# Patient Record
Sex: Female | Born: 1945 | Race: Black or African American | Hispanic: No | Marital: Married | Smoking: Current every day smoker
Health system: Southern US, Community
[De-identification: ages and names within clinical notes are randomized; demographics above are authoritative.]

## PROBLEM LIST (undated history)

## (undated) DIAGNOSIS — C3490 Malignant neoplasm of unspecified part of unspecified bronchus or lung: Secondary | ICD-10-CM

## (undated) DIAGNOSIS — J449 Chronic obstructive pulmonary disease, unspecified: Secondary | ICD-10-CM

## (undated) DIAGNOSIS — I729 Aneurysm of unspecified site: Secondary | ICD-10-CM

## (undated) DIAGNOSIS — I10 Essential (primary) hypertension: Secondary | ICD-10-CM

## (undated) DIAGNOSIS — E039 Hypothyroidism, unspecified: Secondary | ICD-10-CM

## (undated) DIAGNOSIS — K259 Gastric ulcer, unspecified as acute or chronic, without hemorrhage or perforation: Secondary | ICD-10-CM

## (undated) DIAGNOSIS — Z8679 Personal history of other diseases of the circulatory system: Secondary | ICD-10-CM

## (undated) DIAGNOSIS — R7303 Prediabetes: Secondary | ICD-10-CM

## (undated) DIAGNOSIS — F419 Anxiety disorder, unspecified: Secondary | ICD-10-CM

## (undated) DIAGNOSIS — C801 Malignant (primary) neoplasm, unspecified: Secondary | ICD-10-CM

## (undated) DIAGNOSIS — Z9889 Other specified postprocedural states: Secondary | ICD-10-CM

## (undated) DIAGNOSIS — Z982 Presence of cerebrospinal fluid drainage device: Secondary | ICD-10-CM

## (undated) DIAGNOSIS — J9610 Chronic respiratory failure, unspecified whether with hypoxia or hypercapnia: Secondary | ICD-10-CM

## (undated) DIAGNOSIS — Z9981 Dependence on supplemental oxygen: Secondary | ICD-10-CM

## (undated) DIAGNOSIS — K219 Gastro-esophageal reflux disease without esophagitis: Secondary | ICD-10-CM

## (undated) DIAGNOSIS — D649 Anemia, unspecified: Secondary | ICD-10-CM

## (undated) HISTORY — DX: Gastric ulcer, unspecified as acute or chronic, without hemorrhage or perforation: K25.9

## (undated) HISTORY — PX: CATARACT EXTRACTION, BILATERAL: SHX1313

## (undated) HISTORY — PX: LUNG REMOVAL, PARTIAL: SHX233

## (undated) HISTORY — PX: ABDOMINAL HYSTERECTOMY: SHX81

---

## 2001-04-22 ENCOUNTER — Encounter: Payer: Self-pay | Admitting: Family Medicine

## 2001-04-22 ENCOUNTER — Ambulatory Visit (HOSPITAL_COMMUNITY): Admission: RE | Admit: 2001-04-22 | Discharge: 2001-04-22 | Payer: Self-pay | Admitting: Family Medicine

## 2002-04-23 ENCOUNTER — Encounter: Payer: Self-pay | Admitting: *Deleted

## 2002-04-23 ENCOUNTER — Ambulatory Visit (HOSPITAL_COMMUNITY): Admission: RE | Admit: 2002-04-23 | Discharge: 2002-04-23 | Payer: Self-pay | Admitting: *Deleted

## 2003-05-11 ENCOUNTER — Ambulatory Visit (HOSPITAL_COMMUNITY): Admission: RE | Admit: 2003-05-11 | Discharge: 2003-05-11 | Payer: Self-pay | Admitting: Internal Medicine

## 2003-06-14 ENCOUNTER — Ambulatory Visit (HOSPITAL_COMMUNITY): Admission: RE | Admit: 2003-06-14 | Discharge: 2003-06-14 | Payer: Self-pay | Admitting: *Deleted

## 2003-06-14 ENCOUNTER — Encounter: Payer: Self-pay | Admitting: *Deleted

## 2004-02-21 ENCOUNTER — Encounter (HOSPITAL_COMMUNITY): Admission: RE | Admit: 2004-02-21 | Discharge: 2004-03-22 | Payer: Self-pay

## 2004-03-23 ENCOUNTER — Encounter (HOSPITAL_COMMUNITY): Admission: RE | Admit: 2004-03-23 | Discharge: 2004-04-22 | Payer: Self-pay | Admitting: *Deleted

## 2004-04-23 ENCOUNTER — Encounter (HOSPITAL_COMMUNITY): Admission: RE | Admit: 2004-04-23 | Discharge: 2004-05-23 | Payer: Self-pay | Admitting: *Deleted

## 2004-05-25 ENCOUNTER — Encounter (HOSPITAL_COMMUNITY): Admission: RE | Admit: 2004-05-25 | Discharge: 2004-06-24 | Payer: Self-pay | Admitting: *Deleted

## 2004-10-31 ENCOUNTER — Ambulatory Visit (HOSPITAL_COMMUNITY): Admission: RE | Admit: 2004-10-31 | Discharge: 2004-10-31 | Payer: Self-pay | Admitting: Family Medicine

## 2005-11-19 ENCOUNTER — Ambulatory Visit (HOSPITAL_COMMUNITY): Admission: RE | Admit: 2005-11-19 | Discharge: 2005-11-19 | Payer: Self-pay | Admitting: Family Medicine

## 2008-09-21 ENCOUNTER — Ambulatory Visit (HOSPITAL_COMMUNITY): Admission: RE | Admit: 2008-09-21 | Discharge: 2008-09-21 | Payer: Self-pay | Admitting: Internal Medicine

## 2009-10-11 ENCOUNTER — Ambulatory Visit (HOSPITAL_COMMUNITY): Admission: RE | Admit: 2009-10-11 | Discharge: 2009-10-11 | Payer: Self-pay | Admitting: Internal Medicine

## 2010-10-15 ENCOUNTER — Ambulatory Visit (HOSPITAL_COMMUNITY): Admission: RE | Admit: 2010-10-15 | Discharge: 2010-10-15 | Payer: Self-pay | Admitting: Cardiology

## 2010-12-22 ENCOUNTER — Encounter: Payer: Self-pay | Admitting: Family Medicine

## 2011-04-19 NOTE — Op Note (Signed)
   NAME:  DALAYZA, ZAMBRANA                          ACCOUNT NO.:  [id number]   MEDICAL RECORD NO.:  [id number]                   PATIENT TYPE:  AMB   LOCATION:  DAY                                  FACILITY:  [facility]   PHYSICIAN:  Lionel December, M.D.                 DATE OF BIRTH:  [birth year]   DATE OF PROCEDURE:  05/11/2003  DATE OF DISCHARGE:                                 OPERATIVE REPORT   PROCEDURE:  Total colonoscopy.   INDICATIONS FOR PROCEDURE:  Ms. Tracey Martinez is a 65 year old African-American  female who is undergoing screening colonoscopy. She has constipation  possibly related to her medications but no other GI symptoms. Family history  is significant for polyps in a brother and a mother. The procedure was  reviewed with the patient and informed consent was obtained.   PREOP MEDICATIONS:  Demerol 75 mg IV, Versed 8 mg IV in divided dose.   INSTRUMENT:  Olympus video system.   FINDINGS:  Procedure performed in endoscopy suite. The patient's vital signs  and O2 saturations were monitored during the procedure and remained stable.  The patient was placed in the left lateral decubitus position, rectal  examination performed. No abnormalities noted on external or digital exam.  The scope was placed in the rectum and advanced under direct vision to the  sigmoid colon and beyond. Redundant colon, she still had scattered stool.  Preparation overall was felt to be satisfactory.  The scope was passed into  the cecum which was identified by the appendiceal orifice and appendiceal  stump/orifice. Pictures were taken for the record. As the scope was  withdrawn, colonic mucosa was once again carefully examined. A few tiny  diverticula noted in the sigmoid colon. There were no polyps or tumor  masses. The rectal mucosa was normal. The scope was retroflexed to examine  the anorectal junction and small hemorrhoids were noted below the dentate  line. The endoscope was straightened and withdrawn.  The patient tolerated  the procedure well.   FINAL DIAGNOSES:  A few tiny diverticula at the sigmoid colon, small  external hemorrhoids otherwise normal redundant colon.   RECOMMENDATIONS:  High fiber diet. She will resume her usual medications.  She should continue with yearly Hemoccults and consider having another exam  10 years from now.                                               Lionel December, M.D.    NR/MEDQ  D:  05/11/2003  T:  05/11/2003  Job:  [id number]   cc:   Stanton Kidney B. Jenelle Mages, M.D.  [facility]

## 2011-10-07 ENCOUNTER — Other Ambulatory Visit (HOSPITAL_COMMUNITY): Payer: Self-pay | Admitting: Nurse Practitioner

## 2011-10-07 DIAGNOSIS — Z139 Encounter for screening, unspecified: Secondary | ICD-10-CM

## 2011-10-21 ENCOUNTER — Ambulatory Visit (HOSPITAL_COMMUNITY)
Admission: RE | Admit: 2011-10-21 | Discharge: 2011-10-21 | Disposition: A | Discharge status: Home / Self Care | Payer: Medicare Other | Source: Ambulatory Visit | Attending: Nurse Practitioner | Admitting: Nurse Practitioner

## 2011-10-21 DIAGNOSIS — Z1231 Encounter for screening mammogram for malignant neoplasm of breast: Secondary | ICD-10-CM | POA: Insufficient documentation

## 2011-10-21 DIAGNOSIS — Z139 Encounter for screening, unspecified: Secondary | ICD-10-CM

## 2012-04-22 ENCOUNTER — Emergency Department (HOSPITAL_COMMUNITY): Payer: Medicare Other

## 2012-04-22 ENCOUNTER — Emergency Department (HOSPITAL_COMMUNITY)
Admission: EM | Admit: 2012-04-22 | Discharge: 2012-04-22 | Disposition: A | Discharge status: Home / Self Care | Payer: Medicare Other | Attending: Emergency Medicine | Admitting: Emergency Medicine

## 2012-04-22 ENCOUNTER — Encounter (HOSPITAL_COMMUNITY): Payer: Self-pay | Admitting: *Deleted

## 2012-04-22 DIAGNOSIS — C3490 Malignant neoplasm of unspecified part of unspecified bronchus or lung: Secondary | ICD-10-CM | POA: Insufficient documentation

## 2012-04-22 DIAGNOSIS — I10 Essential (primary) hypertension: Secondary | ICD-10-CM | POA: Insufficient documentation

## 2012-04-22 DIAGNOSIS — R062 Wheezing: Secondary | ICD-10-CM | POA: Insufficient documentation

## 2012-04-22 DIAGNOSIS — R0602 Shortness of breath: Secondary | ICD-10-CM | POA: Insufficient documentation

## 2012-04-22 DIAGNOSIS — J441 Chronic obstructive pulmonary disease with (acute) exacerbation: Secondary | ICD-10-CM

## 2012-04-22 HISTORY — DX: Malignant (primary) neoplasm, unspecified: C80.1

## 2012-04-22 HISTORY — DX: Malignant neoplasm of unspecified part of unspecified bronchus or lung: C34.90

## 2012-04-22 HISTORY — DX: Gastro-esophageal reflux disease without esophagitis: K21.9

## 2012-04-22 HISTORY — DX: Essential (primary) hypertension: I10

## 2012-04-22 HISTORY — DX: Aneurysm of unspecified site: I72.9

## 2012-04-22 HISTORY — DX: Anxiety disorder, unspecified: F41.9

## 2012-04-22 LAB — BASIC METABOLIC PANEL
CO2: 35 mEq/L — ABNORMAL HIGH (ref 19–32)
Calcium: 10 mg/dL (ref 8.4–10.5)
Creatinine, Ser: 0.67 mg/dL (ref 0.50–1.10)
Glucose, Bld: 155 mg/dL — ABNORMAL HIGH (ref 70–99)
Potassium: 3.5 mEq/L (ref 3.5–5.1)
Sodium: 133 mEq/L — ABNORMAL LOW (ref 135–145)

## 2012-04-22 LAB — DIFFERENTIAL
Basophils Absolute: 0 10*3/uL (ref 0.0–0.1)
Eosinophils Absolute: 0.1 10*3/uL (ref 0.0–0.7)
Lymphocytes Relative: 10 % — ABNORMAL LOW (ref 12–46)
Lymphs Abs: 0.9 10*3/uL (ref 0.7–4.0)
Monocytes Absolute: 0.9 10*3/uL (ref 0.1–1.0)
Monocytes Relative: 9 % (ref 3–12)
Neutrophils Relative %: 80 % — ABNORMAL HIGH (ref 43–77)

## 2012-04-22 LAB — CBC
MCHC: 32.2 g/dL (ref 30.0–36.0)
Platelets: 404 10*3/uL — ABNORMAL HIGH (ref 150–400)
RBC: 3.18 MIL/uL — ABNORMAL LOW (ref 3.87–5.11)
RDW: 15.5 % (ref 11.5–15.5)

## 2012-04-22 LAB — BLOOD GAS, ARTERIAL
Drawn by: 23534
O2 Content: 2 L/min
pCO2 arterial: 48.4 mmHg — ABNORMAL HIGH (ref 35.0–45.0)
pH, Arterial: 7.444 — ABNORMAL HIGH (ref 7.350–7.400)

## 2012-04-22 LAB — CARDIAC PANEL(CRET KIN+CKTOT+MB+TROPI)
Relative Index: INVALID (ref 0.0–2.5)
Total CK: 19 U/L (ref 7–177)

## 2012-04-22 MED ORDER — IPRATROPIUM BROMIDE 0.02 % IN SOLN
0.5000 mg | Freq: Once | RESPIRATORY_TRACT | Status: AC
  Administered 2012-04-22: 0.5 mg via RESPIRATORY_TRACT
  Filled 2012-04-22: qty 2.5

## 2012-04-22 MED ORDER — METHYLPREDNISOLONE SODIUM SUCC 125 MG IJ SOLR
125.0000 mg | Freq: Once | INTRAMUSCULAR | Status: AC
  Administered 2012-04-22: 125 mg via INTRAVENOUS
  Filled 2012-04-22: qty 2

## 2012-04-22 MED ORDER — IOHEXOL 350 MG/ML SOLN
100.0000 mL | Freq: Once | INTRAVENOUS | Status: AC | PRN
  Administered 2012-04-22: 100 mL via INTRAVENOUS

## 2012-04-22 MED ORDER — ALBUTEROL SULFATE (5 MG/ML) 0.5% IN NEBU
2.5000 mg | INHALATION_SOLUTION | Freq: Once | RESPIRATORY_TRACT | Status: AC
  Administered 2012-04-22: 2.5 mg via RESPIRATORY_TRACT
  Filled 2012-04-22: qty 0.5

## 2012-04-22 MED ORDER — PREDNISONE 50 MG PO TABS: ORAL_TABLET | ORAL | Status: AC

## 2012-04-22 MED ORDER — ALBUTEROL SULFATE HFA 108 (90 BASE) MCG/ACT IN AERS: 2.0000 | INHALATION_SPRAY | RESPIRATORY_TRACT | Status: DC | PRN

## 2012-04-22 MED ORDER — DOXYCYCLINE HYCLATE 100 MG PO CAPS: 100.0000 mg | ORAL_CAPSULE | Freq: Two times a day (BID) | ORAL | Status: AC

## 2012-04-22 MED ORDER — MOXIFLOXACIN HCL IN NACL 400 MG/250ML IV SOLN
400.0000 mg | Freq: Once | INTRAVENOUS | Status: AC
  Administered 2012-04-22: 400 mg via INTRAVENOUS
  Filled 2012-04-22: qty 250

## 2012-04-22 NOTE — ED Notes (Signed)
States productive cough of yellow phelgm and increased use of home O2 usually at 2 L/M but at times at 3 L/M, hx of lung CA

## 2012-04-22 NOTE — ED Notes (Signed)
Pt c/o sob that started two weeks ago, requires oxygen on prn basis, states that she has had to use it more over the past two weeks, admits to cough, productive with yellow sputum production, denies any pain,

## 2012-04-22 NOTE — Discharge Instructions (Signed)
Chronic Obstructive Pulmonary Disease Followup with her Dr. this week. Followup with your oncologist and arm regarding her lung nodule. Return to ED [place] worsening symptoms. Chronic obstructive pulmonary disease (COPD) is a condition in which airflow from the lungs is restricted. The lungs can never return to normal, but there are measures you can take which will improve them and make you feel better. CAUSES   Smoking.   Exposure to secondhand smoke.   Breathing in irritants (pollution, cigarette smoke, strong smells, aerosol sprays, paint fumes).   History of lung infections.  TREATMENT  Treatment focuses on making you comfortable (supportive care). Your caregiver may prescribe medications (inhaled or pills) to help improve your breathing. HOME CARE INSTRUCTIONS   If you smoke, stop smoking.   Avoid exposure to smoke, chemicals, and fumes that aggravate your breathing.   Take antibiotic medicines as directed by your caregiver.   Avoid medicines that dry up your system and slow down the elimination of secretions (antihistamines and cough syrups). This decreases respiratory capacity and may lead to infections.   Drink enough water and fluids to keep your urine clear or pale yellow. This loosens secretions.   Use humidifiers at home and at your bedside if they do not make breathing difficult.   Receive all protective vaccines your caregiver suggests, especially pneumococcal and influenza.   Use home oxygen as suggested.   Stay active. Exercise and physical activity will help maintain your ability to do things you want to do.   Eat a healthy diet.  SEEK MEDICAL CARE IF:   You develop pus-like mucus (sputum).   Breathing is more labored or exercise becomes difficult to do.   You are running out of the medicine you take for your breathing.  SEEK IMMEDIATE MEDICAL CARE IF:   You have a rapid heart rate.   You have agitation, confusion, tremors, or are in a stupor (family  members may need to observe this).   It becomes difficult to breathe.   You develop chest pain.   You have a fever.  MAKE SURE YOU:   Understand these instructions.   Will watch your condition.   Will get help right away if you are not doing well or get worse.  Document Released: 08/28/2005 Document Revised: 11/07/2011 Document Reviewed: 01/18/2011 [facility] Patient Information 2012 [place], [place].

## 2012-04-22 NOTE — ED Provider Notes (Signed)
History  This chart was scribed for Glynn Octave, MD by Bennett Scrape. This patient was seen in room [facility] and the patient's care was started at 3:16PM.  CSN: [id number]  Arrival date & time 04/22/12  1447   First MD Initiated Contact with Patient 04/22/12 1516      Chief Complaint  Patient presents with  . Shortness of Breath    The history is provided by the patient. No language interpreter was used.    Tracey Martinez is a 66 y.o. female who presents to the Emergency Department complaining of 2 weeks of gradual onset, gradually worsening, constant SOB with associated productive cough of yellow sputum. The symptoms are worse with exertion. Pt states that she wears O2 at home and reports increased use since the onset of the symptoms. Pt also states that she is not able to due her normal activities at home, because she feels SOB with walking around the house. She reports doing albuterol and brovana breathing treatments once a day with no improvement in symptoms. Pt was sent here from her PCP's office, because PCP stated that she could not sufficiently treat her symptoms. She denies chest pain, fevers, and emesis as associated symptoms. She has a h/o COPD and lung CA but denies being on radiation currently. Pt has one prior admission for PNA. She denies a h/o diabetes. She is a current some day smoker but denies alcohol use.   PCP is Ninfa Linden. Dr. Ermalinda Memos is CA doctor in [facility].  Past Medical History  Diagnosis Date  . Aneurysm     brain  . Cancer   . Lung cancer   . GERD (gastroesophageal reflux disease)   . Hypertension   . Anxiety     Past Surgical History  Procedure Date  . Lung removal, partial   . Abdominal hysterectomy     No family history on file.  History  Substance Use Topics  . Smoking status: Current Some Day Smoker  . Smokeless tobacco: Not on file  . Alcohol Use: No     Review of Systems  A complete 10 system review of systems was  obtained and all systems are negative except as noted in the HPI and PMH.   Allergies  Sulfa antibiotics  Home Medications  No current outpatient prescriptions on file.  Triage Vitals: BP 112/64  Pulse 122  Temp(Src) 98.2 F (36.8 C) (Oral)  Resp 23  Ht 5\' 7"  (1.702 m)  Wt 150 lb (68.04 kg)  BMI 23.49 kg/m2  SpO2 95%  Physical Exam  Nursing note and vitals reviewed. Constitutional: She is oriented to person, place, and time. She appears well-developed and well-nourished. No distress.  HENT:  Head: Normocephalic and atraumatic.       Bitemporal wasting  Eyes: Conjunctivae and EOM are normal. Pupils are equal, round, and reactive to light.  Neck: Normal range of motion. Neck supple. No tracheal deviation present.  Cardiovascular: Normal rate, regular rhythm, normal heart sounds and intact distal pulses.   Pulmonary/Chest: Effort normal. No respiratory distress. She has wheezes.       Moist productive cough, coarse breath sounds throughout, rhonchi in the right upper lobe with scattered wheezes  Abdominal: Soft. She exhibits no distension.  Musculoskeletal: Normal range of motion. She exhibits no edema.  Neurological: She is alert and oriented to person, place, and time.  Skin: Skin is warm and dry.  Psychiatric: She has a normal mood and affect. Her behavior is normal.  ED Course  Procedures (including critical care time)  DIAGNOSTIC STUDIES: Oxygen Saturation is 95% on O2, adequate by my interpretation.    COORDINATION OF CARE: 3:25PM-Discussed treatment plan which includes chest x-ray and breathing treatments with pt and pt agreed to plan. Counseled pt on smoking cessation and pt states that she is trying to quit smoking. 5:01PM-Pt rechecked and fells better. Upon re-exam pt still has rhonchi. 6:52PM-Pt rechecked and states that she is ready to go home. Informed pt of nodule on lung and she states that she already knew about it. Discussed discharge plan with pt and pt  agreed to plan.  Labs Reviewed  BLOOD GAS, ARTERIAL - Abnormal; Notable for the following:    pH, Arterial 7.444 (*)    pCO2 arterial 48.4 (*)    Bicarbonate 32.6 (*)    Acid-Base Excess 8.3 (*)    All other components within normal limits  CBC - Abnormal; Notable for the following:    RBC 3.18 (*)    Hemoglobin 8.9 (*)    HCT 27.6 (*)    Platelets 404 (*)    All other components within normal limits  DIFFERENTIAL - Abnormal; Notable for the following:    Neutrophils Relative 80 (*)    Lymphocytes Relative 10 (*)    All other components within normal limits  BASIC METABOLIC PANEL - Abnormal; Notable for the following:    Sodium 133 (*)    Chloride 90 (*)    CO2 35 (*)    Glucose, Bld 155 (*)    GFR calc non Af Amer 89 (*)    All other components within normal limits  CARDIAC PANEL(CRET KIN+CKTOT+MB+TROPI)   Ct Angio Chest W/cm &/or Wo Cm  04/22/2012  *RADIOLOGY REPORT*  Clinical Data: Shortness of breath for 2 weeks.  Lung cancer.  CT ANGIOGRAPHY CHEST  Technique:  Multidetector CT imaging of the chest using the standard protocol during bolus administration of intravenous contrast. Multiplanar reconstructed images including MIPs were obtained and reviewed to evaluate the vascular anatomy.  Contrast: OMNIPAQUE IOHEXOL 350 MG/ML SOLN  Comparison: 04/22/2012  Findings: Prominent emphysema noted favoring the lung apices, with postoperative findings in the right thorax, including multiple right upper medial rib osteotomies, resulting in volume loss.  Soft tissue density above the aortic arch in the left upper lobe measures approximately 1.8 x 1.1 cm, and could represent scarring, region of radiation therapy, or residual/recurrent malignancy.  A spiculated nodule in the right lower lobe on image 47 of series 7 measures 2.2 x 1.4 cm and is suspicious for malignancy.  Posterior soft tissue density along the right upper lung is probably due to scarring or radiation therapy.  Primarily  linear densities with slightly nodular components are noted in the posterior basal segments of both lower lobes. Scarring/atelectasis favored.  Mild right heart enlargement is present with preserved interventricular septal contour without reflux of contrast into the IVC.  No filling defect is identified in the pulmonary arterial tree to suggest pulmonary embolus.  Thoracic spondylosis noted.  Punctate scattered calcifications in the liver are probably from old granulomatous disease.  IMPRESSION: 1. No filling defect is identified in the pulmonary arterial tree to suggest pulmonary embolus. 2.  Prominent emphysema. 3.  Spiculated 2.2 cm right lower lobe nodule is particularly concerning for malignancy. 4.  Soft tissue density in the lung parenchyma of the aortic arch could represent scarring or malignancy. 5.  Soft tissue density along the right upper posterior pleural surface is most  likely related to scarring or prior therapy, but requires monitoring in order to exclude cancer.  Alternatively, PET CT could be utilized to assess these abnormal regions. 6.  Abnormal right heart enlargement.  7.  Atherosclerosis.  Original Report Authenticated By: Dellia Cloud, M.D.   Dg Chest Portable 1 View  04/22/2012  *RADIOLOGY REPORT*  Clinical Data: Shortness of breath, cough and congestion.  Lung cancer.  PORTABLE CHEST - 1 VIEW  Comparison: None.  Findings: There are postoperative changes and volume loss in the right hemithorax.  Possible pleural edge at the apex of the right hemithorax.  Vertical lucencies are seen along the right paratracheal region.  Lungs are otherwise clear.  No pleural fluid.  IMPRESSION:  1.  Postoperative changes in the right hemithorax.  Question chronic right apical pleural air.  Difficult to definitively exclude right-sided pneumomediastinum.  Comparison with prior exams would be helpful.  In the absence of prior exams, CT chest with contrast would likely be helpful. 2.  Possible nodular  density in the peripheral right midlung zone. Again, this can be further evaluated with chest CT.  Original Report Authenticated By: Reyes Ivan, M.D.     No diagnosis found.    MDM  History of lung cancer status post resection, COPD presenting with shortness of breath, cough and subjective fevers. Coarse breath sounds bilaterally.  CT findings reviewed with patient. There is no pulmonary embolism. She is aware of the right lower lobe nodule and is followed by her oncologist in [place]. She ambulated in the department without any desaturation. Her breath sounds remained coarse. She states her breathing is better and she wishes to go home. Denies any chest pain. Will treat as COPD exacerbation.  Nebs, steroids, antibiotics, follow up with PCP this week.   Date: 04/22/2012  Rate: 102  Rhythm: sinus tachycardia  QRS Axis: normal  Intervals: normal  ST/T Wave abnormalities: normal  Conduction Disutrbances:none  Narrative Interpretation:   Old EKG Reviewed: none available    I personally performed the services described in this documentation, which was scribed in my presence.  The recorded information has been reviewed and considered.       Glynn Octave, MD 04/22/12 [phone]

## 2012-04-22 NOTE — ED Notes (Signed)
Ambulated pt. Pulse ox was about 95%, HR low in the 40s. Pt felt fine a little light headed

## 2012-05-08 ENCOUNTER — Encounter (HOSPITAL_COMMUNITY): Payer: Self-pay | Admitting: *Deleted

## 2012-05-08 ENCOUNTER — Inpatient Hospital Stay (HOSPITAL_COMMUNITY)
Admission: EM | Admit: 2012-05-08 | Discharge: 2012-05-12 | DRG: 378 | Disposition: A | Discharge status: Home / Self Care | Payer: Medicare Other | Attending: Internal Medicine | Admitting: Internal Medicine

## 2012-05-08 DIAGNOSIS — Z85118 Personal history of other malignant neoplasm of bronchus and lung: Secondary | ICD-10-CM

## 2012-05-08 DIAGNOSIS — R634 Abnormal weight loss: Secondary | ICD-10-CM

## 2012-05-08 DIAGNOSIS — D509 Iron deficiency anemia, unspecified: Secondary | ICD-10-CM | POA: Diagnosis present

## 2012-05-08 DIAGNOSIS — Z9981 Dependence on supplemental oxygen: Secondary | ICD-10-CM

## 2012-05-08 DIAGNOSIS — D500 Iron deficiency anemia secondary to blood loss (chronic): Secondary | ICD-10-CM | POA: Diagnosis present

## 2012-05-08 DIAGNOSIS — R112 Nausea with vomiting, unspecified: Secondary | ICD-10-CM

## 2012-05-08 DIAGNOSIS — F17200 Nicotine dependence, unspecified, uncomplicated: Secondary | ICD-10-CM | POA: Diagnosis present

## 2012-05-08 DIAGNOSIS — J9610 Chronic respiratory failure, unspecified whether with hypoxia or hypercapnia: Secondary | ICD-10-CM | POA: Diagnosis present

## 2012-05-08 DIAGNOSIS — J4489 Other specified chronic obstructive pulmonary disease: Secondary | ICD-10-CM | POA: Diagnosis present

## 2012-05-08 DIAGNOSIS — T3995XA Adverse effect of unspecified nonopioid analgesic, antipyretic and antirheumatic, initial encounter: Secondary | ICD-10-CM | POA: Diagnosis present

## 2012-05-08 DIAGNOSIS — K209 Esophagitis, unspecified without bleeding: Secondary | ICD-10-CM | POA: Diagnosis present

## 2012-05-08 DIAGNOSIS — K264 Chronic or unspecified duodenal ulcer with hemorrhage: Principal | ICD-10-CM | POA: Diagnosis present

## 2012-05-08 DIAGNOSIS — R531 Weakness: Secondary | ICD-10-CM

## 2012-05-08 DIAGNOSIS — D62 Acute posthemorrhagic anemia: Secondary | ICD-10-CM | POA: Diagnosis present

## 2012-05-08 DIAGNOSIS — C3490 Malignant neoplasm of unspecified part of unspecified bronchus or lung: Secondary | ICD-10-CM | POA: Diagnosis present

## 2012-05-08 DIAGNOSIS — D649 Anemia, unspecified: Secondary | ICD-10-CM

## 2012-05-08 DIAGNOSIS — I10 Essential (primary) hypertension: Secondary | ICD-10-CM | POA: Diagnosis present

## 2012-05-08 DIAGNOSIS — J449 Chronic obstructive pulmonary disease, unspecified: Secondary | ICD-10-CM | POA: Diagnosis present

## 2012-05-08 HISTORY — DX: Chronic respiratory failure, unspecified whether with hypoxia or hypercapnia: J96.10

## 2012-05-08 HISTORY — DX: Dependence on supplemental oxygen: Z99.81

## 2012-05-08 HISTORY — DX: Hypothyroidism, unspecified: E03.9

## 2012-05-08 HISTORY — DX: Anemia, unspecified: D64.9

## 2012-05-08 HISTORY — DX: Gastric ulcer, unspecified as acute or chronic, without hemorrhage or perforation: K25.9

## 2012-05-08 HISTORY — DX: Chronic obstructive pulmonary disease, unspecified: J44.9

## 2012-05-08 LAB — TROPONIN I: Troponin I: <0.3 ng/mL (ref <0.30)

## 2012-05-08 LAB — PROTIME-INR
INR: 1.02 (ref 0.00–1.49)
Prothrombin Time: 13.6 seconds (ref 11.6–15.2)

## 2012-05-08 LAB — CBC
Hemoglobin: 8.2 g/dL — ABNORMAL LOW (ref 12.0–15.0)
MCH: 25.6 pg — ABNORMAL LOW (ref 26.0–34.0)
MCH: 26.9 pg (ref 26.0–34.0)
MCV: 82.8 fL (ref 78.0–100.0)
Platelets: 585 10*3/uL — ABNORMAL HIGH (ref 150–400)
Platelets: 463 10*3/uL — ABNORMAL HIGH (ref 150–400)
RBC: 3.05 MIL/uL — ABNORMAL LOW (ref 3.87–5.11)
RDW: 17.1 % — ABNORMAL HIGH (ref 11.5–15.5)
WBC: 10.8 10*3/uL — ABNORMAL HIGH (ref 4.0–10.5)

## 2012-05-08 LAB — DIFFERENTIAL
Basophils Absolute: 0 10*3/uL (ref 0.0–0.1)
Eosinophils Absolute: 0.1 10*3/uL (ref 0.0–0.7)
Eosinophils Relative: 1 % (ref 0–5)
Lymphs Abs: 0.7 10*3/uL (ref 0.7–4.0)
Monocytes Absolute: 0.5 10*3/uL (ref 0.1–1.0)

## 2012-05-08 LAB — RETICULOCYTES: Retic Count, Absolute: 113 10*3/uL (ref 19.0–186.0)

## 2012-05-08 LAB — COMPREHENSIVE METABOLIC PANEL
ALT: 8 U/L (ref 0–35)
Calcium: 9.4 mg/dL (ref 8.4–10.5)
Creatinine, Ser: 0.85 mg/dL (ref 0.50–1.10)
GFR calc Af Amer: 80 mL/min — ABNORMAL LOW (ref >90)
Glucose, Bld: 154 mg/dL — ABNORMAL HIGH (ref 70–99)
Sodium: 128 mEq/L — ABNORMAL LOW (ref 135–145)
Total Protein: 6.6 g/dL (ref 6.0–8.3)

## 2012-05-08 LAB — GLUCOSE, CAPILLARY
Glucose-Capillary: 114 mg/dL — ABNORMAL HIGH (ref 70–99)
Glucose-Capillary: 131 mg/dL — ABNORMAL HIGH (ref 70–99)

## 2012-05-08 LAB — OCCULT BLOOD, POC DEVICE: Fecal Occult Bld: POSITIVE

## 2012-05-08 MED ORDER — THEOPHYLLINE ER 300 MG PO CP24
300.0000 mg | ORAL_CAPSULE | Freq: Two times a day (BID) | ORAL | Status: DC
  Administered 2012-05-08 – 2012-05-12 (×8): 300 mg via ORAL
  Filled 2012-05-08 (×11): qty 1

## 2012-05-08 MED ORDER — LEVOTHYROXINE SODIUM 88 MCG PO TABS
88.0000 ug | ORAL_TABLET | Freq: Every day | ORAL | Status: DC
  Administered 2012-05-08 – 2012-05-12 (×4): 88 ug via ORAL
  Filled 2012-05-08 (×6): qty 1

## 2012-05-08 MED ORDER — BUSPIRONE HCL 5 MG PO TABS
10.0000 mg | ORAL_TABLET | Freq: Three times a day (TID) | ORAL | Status: DC | PRN
  Administered 2012-05-10 – 2012-05-12 (×3): 10 mg via ORAL
  Filled 2012-05-08: qty 1
  Filled 2012-05-08 (×3): qty 2

## 2012-05-08 MED ORDER — TIOTROPIUM BROMIDE MONOHYDRATE 18 MCG IN CAPS
18.0000 ug | ORAL_CAPSULE | Freq: Every day | RESPIRATORY_TRACT | Status: DC
  Administered 2012-05-08 – 2012-05-12 (×5): 18 ug via RESPIRATORY_TRACT
  Filled 2012-05-08 (×2): qty 5

## 2012-05-08 MED ORDER — ALBUTEROL SULFATE HFA 108 (90 BASE) MCG/ACT IN AERS: 2.0000 | INHALATION_SPRAY | RESPIRATORY_TRACT | Status: DC | PRN

## 2012-05-08 MED ORDER — LISINOPRIL 10 MG PO TABS
10.0000 mg | ORAL_TABLET | Freq: Every day | ORAL | Status: DC
  Administered 2012-05-08 – 2012-05-12 (×4): 10 mg via ORAL
  Filled 2012-05-08 (×5): qty 1

## 2012-05-08 MED ORDER — SODIUM CHLORIDE 0.9 % IJ SOLN
INTRAMUSCULAR | Status: AC
  Administered 2012-05-08: 10 mL
  Filled 2012-05-08: qty 3

## 2012-05-08 MED ORDER — ACETAMINOPHEN 500 MG PO TABS
1000.0000 mg | ORAL_TABLET | ORAL | Status: DC | PRN
  Administered 2012-05-11 – 2012-05-12 (×2): 1000 mg via ORAL
  Filled 2012-05-08 (×2): qty 2

## 2012-05-08 MED ORDER — AMITRIPTYLINE HCL 25 MG PO TABS
50.0000 mg | ORAL_TABLET | Freq: Every day | ORAL | Status: DC
  Administered 2012-05-08 – 2012-05-11 (×4): 50 mg via ORAL
  Filled 2012-05-08: qty 1
  Filled 2012-05-08 (×2): qty 2
  Filled 2012-05-08: qty 1
  Filled 2012-05-08: qty 2

## 2012-05-08 MED ORDER — ATORVASTATIN CALCIUM 20 MG PO TABS
20.0000 mg | ORAL_TABLET | Freq: Every day | ORAL | Status: DC
  Administered 2012-05-08 – 2012-05-12 (×4): 20 mg via ORAL
  Filled 2012-05-08 (×5): qty 1

## 2012-05-08 MED ORDER — ARFORMOTEROL TARTRATE 15 MCG/2ML IN NEBU
15.0000 ug | INHALATION_SOLUTION | Freq: Two times a day (BID) | RESPIRATORY_TRACT | Status: DC
  Administered 2012-05-08 – 2012-05-12 (×9): 15 ug via RESPIRATORY_TRACT
  Filled 2012-05-08 (×10): qty 2

## 2012-05-08 MED ORDER — FERROUS SULFATE 325 (65 FE) MG PO TABS
325.0000 mg | ORAL_TABLET | Freq: Every day | ORAL | Status: DC
  Administered 2012-05-09 – 2012-05-12 (×4): 325 mg via ORAL
  Filled 2012-05-08 (×5): qty 1

## 2012-05-08 MED ORDER — FUROSEMIDE 20 MG PO TABS
20.0000 mg | ORAL_TABLET | Freq: Two times a day (BID) | ORAL | Status: DC
  Administered 2012-05-08 – 2012-05-12 (×8): 20 mg via ORAL
  Filled 2012-05-08 (×10): qty 1

## 2012-05-08 MED ORDER — DOCUSATE SODIUM 100 MG PO CAPS
100.0000 mg | ORAL_CAPSULE | Freq: Every day | ORAL | Status: DC
  Administered 2012-05-08 – 2012-05-12 (×4): 100 mg via ORAL
  Filled 2012-05-08 (×5): qty 1

## 2012-05-08 MED ORDER — PANTOPRAZOLE SODIUM 40 MG PO TBEC
40.0000 mg | DELAYED_RELEASE_TABLET | Freq: Every day | ORAL | Status: DC
  Administered 2012-05-08 – 2012-05-10 (×3): 40 mg via ORAL
  Filled 2012-05-08 (×3): qty 1

## 2012-05-08 MED ORDER — ONDANSETRON HCL 4 MG PO TABS: 4.0000 mg | ORAL_TABLET | Freq: Four times a day (QID) | ORAL | Status: DC | PRN

## 2012-05-08 MED ORDER — FUROSEMIDE 20 MG PO TABS: 20.0000 mg | ORAL_TABLET | Freq: Two times a day (BID) | ORAL | Status: DC

## 2012-05-08 MED ORDER — SENNOSIDES-DOCUSATE SODIUM 8.6-50 MG PO TABS
2.0000 | ORAL_TABLET | Freq: Every day | ORAL | Status: DC | PRN
  Administered 2012-05-09: 2 via ORAL
  Filled 2012-05-08: qty 2

## 2012-05-08 MED ORDER — ALBUTEROL SULFATE (5 MG/ML) 0.5% IN NEBU: 2.5000 mg | INHALATION_SOLUTION | RESPIRATORY_TRACT | Status: DC | PRN

## 2012-05-08 MED ORDER — FLUTICASONE PROPIONATE 50 MCG/ACT NA SUSP
2.0000 | Freq: Every day | NASAL | Status: DC
  Administered 2012-05-08 – 2012-05-12 (×4): 2 via NASAL
  Filled 2012-05-08: qty 16

## 2012-05-08 MED ORDER — ONDANSETRON HCL 4 MG/2ML IJ SOLN: 4.0000 mg | Freq: Four times a day (QID) | INTRAMUSCULAR | Status: DC | PRN

## 2012-05-08 MED ORDER — SODIUM CHLORIDE 0.9 % IV SOLN
INTRAVENOUS | Status: DC
  Administered 2012-05-08 – 2012-05-10 (×4): via INTRAVENOUS

## 2012-05-08 MED ORDER — SODIUM CHLORIDE 0.9 % IV SOLN: INTRAVENOUS | Status: DC

## 2012-05-08 NOTE — ED Notes (Signed)
Pt states that she was sent to er by pcp in [place] due to low "blood count" pt had blood work done yesterday, was called this am and told to come to er, pt recently seen in er on 04/30/2012 for bronchitis. C/o generalized  weakness that started around 04/23/2012 along with dizziness with standing. Pt states that she has noticed her stools being dark.

## 2012-05-08 NOTE — ED Notes (Signed)
CRITICAL VALUE ALERT  Critical value received:  Hgb:  5.8  Date of notification:  05/08/2012  Time of notification:  1022  Critical value read back:  yes  Nurse who received alert:  A. Dareen Piano, RN  MD notified (1st page):  I. Lynelle Doctor  Time of first page:  1023  MD notified (2nd page):  Time of second page:  Responding MD:  Lars Mage  Time MD responded:  [phone]

## 2012-05-08 NOTE — ED Provider Notes (Cosign Needed)
This chart was scribed for Ward Givens, MD by Wallis Mart. The patient was seen in room APA14/APA14 and the patient's care was started at 9:27 AM.   CSN: [id number]  Arrival date & time 05/08/12  0901   First MD Initiated Contact with Patient 05/08/12 0912      Chief Complaint  Patient presents with  . Abnormal Lab    (Consider location/radiation/quality/duration/timing/severity/associated sxs/prior treatment) HPI Tracey Martinez is a 66 y.o. female who presents to the Emergency Department complaining of gradual onset, persistence of constant, gradually worsening generalized weakness, loss of energy, loss of appetite onset one month ago. Pt feels weak when walking, states that she feels like she needs somebody "to pull her along,"  but does not ambulate with a cane or walker. Pt w/ h/o panic attacks and is not sure if her sx are related when she feels SOB.  Pt saw PCP yesterday for these sx, and was rx'ed prednisone, but she hasn't started taking it, pt's pcp referred to ED for low blood count (6) today. .Pt also saw her pcp last month for bronchitis,  was sent to ED and given breathing treatments.Pt c/o intermittent heaviness in her chest, last episode was a few days ago. The episodes last for a few minutes and nothing specifically triggers the heaviness, she believes they are related to her panic attacks. Pt denies abd. pain, n/v/d. Pt states that she is constipated quite often, her last bm was a couple of days ago, states that her stool is very dark, due to taking iron supplements. Pt smokes occasionally (1/2 pack a day). Denies drinking alcohol.  Pt w/ h/o lung cancer.  Pt denies any h/o blood in stool, diverticulitis, stomach ulcers.  There are no other associated symptoms and no other alleviating or aggravating factors.    PCP: Ninfa Linden, [facility]     Past Medical History  Diagnosis Date  . Aneurysm     brain  . Cancer   . Lung cancer   . GERD  (gastroesophageal reflux disease)   . Hypertension   . Anxiety   . Oxygen dependent     requires oxygen for at least 16 hours per day,     Past Surgical History  Procedure Date  . Lung removal, partial   . Abdominal hysterectomy     No family history on file.  History  Substance Use Topics  . Smoking status: Current Some Day Smoker 1/2 ppd  . Smokeless tobacco: Not on file  . Alcohol Use: No  lives at home Lives with spouse  OB History    Grav Para Term Preterm Abortions TAB SAB Ect Mult Living                  Review of Systems  All other systems reviewed and are negative.    10 Systems reviewed and all are negative for acute change except as noted in the HPI.    Allergies  Sulfa antibiotics  Home Medications     BP 99/63  Pulse 103  Temp 98.5 F (36.9 C)  Resp 20  Ht 5\' 7"  (1.702 m)  Wt 150 lb (68.04 kg)  BMI 23.49 kg/m2  SpO2 98%  Vital signs normal borderline hypotension, mild tachycardia   Physical Exam  Nursing note and vitals reviewed. Constitutional: She is oriented to person, place, and time. She appears well-developed and well-nourished. No distress.  HENT:  Head: Normocephalic and atraumatic.  Right Ear: External ear normal.  Left Ear: External ear normal.  Nose: Nose normal.  Mouth/Throat: Oropharynx is clear and moist.  Eyes: Conjunctivae and EOM are normal. Pupils are equal, round, and reactive to light.       Sclera are pale  Neck: Normal range of motion. Neck supple. No tracheal deviation present.  Cardiovascular: Regular rhythm.  Tachycardia present.   No murmur heard. Pulmonary/Chest: Effort normal and breath sounds normal. No respiratory distress. She has no wheezes. She has no rales. She exhibits no tenderness.       Mucous membranes are pale  Abdominal: Soft. Bowel sounds are normal. She exhibits no distension. There is no tenderness. There is no rebound and no guarding.  Genitourinary:       rectal: normal rectal tone,  dark stool, sent for hemoccult which was +  Musculoskeletal: Normal range of motion. She exhibits no edema.       Palms are pale  Neurological: She is alert and oriented to person, place, and time. No sensory deficit.  Skin: Skin is warm and dry.  Psychiatric: She has a normal mood and affect. Her behavior is normal.    ED Course  Procedures (including critical care time)  Pt prepared for transfusion, pt agreeable with transfusion  11:21 Dr Karilyn Cota, admit to medical bed   DIAGNOSTIC STUDIES:98 Oxygen Saturation is 98% on Schleicher, normal by my interpretation.    COORDINATION OF CARE:  10:30 AM: EDP at bedside. Pt to have blood transfusion and be admitted.  All results reviewed and discussed with pt, questions answered, pt agreeable with plan.  Results for orders placed during the hospital encounter of 05/08/12  CBC      Component Value Range   WBC 9.1  4.0 - 10.5 (K/uL)   RBC 2.27 (*) 3.87 - 5.11 (MIL/uL)   Hemoglobin 5.8 (*) 12.0 - 15.0 (g/dL)   HCT 40.9 (*) 81.1 - 46.0 (%)   MCV 82.8  78.0 - 100.0 (fL)   MCH 25.6 (*) 26.0 - 34.0 (pg)   MCHC 30.9  30.0 - 36.0 (g/dL)   RDW 91.4 (*) 78.2 - 15.5 (%)   Platelets 585 (*) 150 - 400 (K/uL)  DIFFERENTIAL      Component Value Range   Neutrophils Relative 86 (*) 43 - 77 (%)   Neutro Abs 7.8 (*) 1.7 - 7.7 (K/uL)   Lymphocytes Relative 7 (*) 12 - 46 (%)   Lymphs Abs 0.7  0.7 - 4.0 (K/uL)   Monocytes Relative 6  3 - 12 (%)   Monocytes Absolute 0.5  0.1 - 1.0 (K/uL)   Eosinophils Relative 1  0 - 5 (%)   Eosinophils Absolute 0.1  0.0 - 0.7 (K/uL)   Basophils Relative 0  0 - 1 (%)   Basophils Absolute 0.0  0.0 - 0.1 (K/uL)  COMPREHENSIVE METABOLIC PANEL      Component Value Range   Sodium 128 (*) 135 - 145 (mEq/L)   Potassium 3.4 (*) 3.5 - 5.1 (mEq/L)   Chloride 85 (*) 96 - 112 (mEq/L)   CO2 32  19 - 32 (mEq/L)   Glucose, Bld 154 (*) 70 - 99 (mg/dL)   BUN 8  6 - 23 (mg/dL)   Creatinine, Ser 9.56  0.50 - 1.10 (mg/dL)   Calcium 9.4   8.4 - 10.5 (mg/dL)   Total Protein 6.6  6.0 - 8.3 (g/dL)   Albumin 2.7 (*) 3.5 - 5.2 (g/dL)   AST 9  0 - 37 (U/L)   ALT 8  0 - 35 (U/L)   Alkaline Phosphatase 100  39 - 117 (U/L)   Total Bilirubin 0.1 (*) 0.3 - 1.2 (mg/dL)   GFR calc non Af Amer 69 (*) >90 (mL/min)   GFR calc Af Amer 80 (*) >90 (mL/min)  APTT      Component Value Range   aPTT 30  24 - 37 (seconds)  PROTIME-INR      Component Value Range   Prothrombin Time 13.6  11.6 - 15.2 (seconds)   INR 1.02  0.00 - 1.49   TROPONIN I      Component Value Range   Troponin I <0.30  <0.30 (ng/mL)   Laboratory interpretation all normal except moderate anemia, mild hyperglycemia, mild hyponatremia and low chloride    Ct Angio Chest W/cm &/or Wo Cm  04/22/2012  *RADIOLOGY REPORT*  Clinical Data: Shortness of breath for 2 weeks.  Lung cancer.  CT ANGIOGRAPHY CHEST  Technique:  Multidetector CT imaging of the chest using the standard protocol during bolus administration of intravenous contrast. Multiplanar reconstructed images including MIPs were obtained and reviewed to evaluate the vascular anatomy.  Contrast: OMNIPAQUE IOHEXOL 350 MG/ML SOLN  Comparison: 04/22/2012  Findings: Prominent emphysema noted favoring the lung apices, with postoperative findings in the right thorax, including multiple right upper medial rib osteotomies, resulting in volume loss.  Soft tissue density above the aortic arch in the left upper lobe measures approximately 1.8 x 1.1 cm, and could represent scarring, region of radiation therapy, or residual/recurrent malignancy.  A spiculated nodule in the right lower lobe on image 47 of series 7 measures 2.2 x 1.4 cm and is suspicious for malignancy.  Posterior soft tissue density along the right upper lung is probably due to scarring or radiation therapy.  Primarily linear densities with slightly nodular components are noted in the posterior basal segments of both lower lobes. Scarring/atelectasis favored.  Mild right  heart enlargement is present with preserved interventricular septal contour without reflux of contrast into the IVC.  No filling defect is identified in the pulmonary arterial tree to suggest pulmonary embolus.  Thoracic spondylosis noted.  Punctate scattered calcifications in the liver are probably from old granulomatous disease.  IMPRESSION: 1. No filling defect is identified in the pulmonary arterial tree to suggest pulmonary embolus. 2.  Prominent emphysema. 3.  Spiculated 2.2 cm right lower lobe nodule is particularly concerning for malignancy. 4.  Soft tissue density in the lung parenchyma of the aortic arch could represent scarring or malignancy. 5.  Soft tissue density along the right upper posterior pleural surface is most likely related to scarring or prior therapy, but requires monitoring in order to exclude cancer.  Alternatively, PET CT could be utilized to assess these abnormal regions. 6.  Abnormal right heart enlargement.  7.  Atherosclerosis.  Original Report Authenticated By: Dellia Cloud, M.D.   Dg Chest Portable 1 View  04/22/2012  *RADIOLOGY REPORT*  Clinical Data: Shortness of breath, cough and congestion.  Lung cancer.  PORTABLE CHEST - 1 VIEW  Comparison: None.  Findings: There are postoperative changes and volume loss in the right hemithorax.  Possible pleural edge at the apex of the right hemithorax.  Vertical lucencies are seen along the right paratracheal region.  Lungs are otherwise clear.  No pleural fluid.  IMPRESSION:  1.  Postoperative changes in the right hemithorax.  Question chronic right apical pleural air.  Difficult to definitively exclude right-sided pneumomediastinum.  Comparison with prior exams would be helpful.  In  the absence of prior exams, CT chest with contrast would likely be helpful. 2.  Possible nodular density in the peripheral right midlung zone. Again, this can be further evaluated with chest CT.  Original Report Authenticated By: Reyes Ivan,  M.D.    Date: 05/08/2012  Rate: 93  Rhythm: normal sinus rhythm  QRS Axis: normal  Intervals: normal  ST/T Wave abnormalities: normal  Conduction Disutrbances:none  Narrative Interpretation:   Old EKG Reviewed: unchanged from 04/22/2012    1. Anemia symptomatic  2. Weakness generalized     Plan admission  Devoria Albe, MD, FACEP   MDM     I personally performed the services described in this documentation, which was scribed in my presence. The recorded information has been reviewed and considered.  Devoria Albe, MD, Armando Gang     Ward Givens, MD 05/08/12 1130

## 2012-05-08 NOTE — ED Notes (Signed)
At bedside with EDP; hemoccult specimen obtained by EDP; results positive.

## 2012-05-08 NOTE — H&P (Addendum)
Tracey Martinez MRN: [id number] DOB/AGE: 1946/08/12 66 y.o. Primary Care Physician:PATTERSON, KATHY, NP, NP Admit date: 05/08/2012 Chief Complaint: Fatigue, dyspnea. HPI: This 66 year old lady has the above symptoms for approximately last one month. She was in the emergency room last couple weeks ago with similar symptoms. At this time she apparently improved and was discharged home. She comes again because she has not really gotten better. She has not had any nausea, vomiting or hematemesis. She denies any rectal bleeding. She was found to have a hemoglobin today in the emergency room of 5.8. Blood has been ordered and she should be receiving 2 units in. She says her stools are black but they have been black since she's been on tablets for the last 10 years. She did have a colonoscopy in 2004 which was essentially normal. I cannot see evidence of an EGD. She does have a history of lung cancer dating back to early 2001 her most recent review at [facility], they felt that everything was stable and not progressing. She tells me that she has lost approximately 10 pounds in the last one month or so, unintentionally. Ingesting Goody powder for the last one month. Past Medical History  Diagnosis Date  . Aneurysm     brain  . Cancer   . Lung cancer   . GERD (gastroesophageal reflux disease)   . Hypertension   . Anxiety   . Oxygen dependent     requires oxygen for at least 16 hours per day,    Past Surgical History  Procedure Date  . Lung removal, partial   . Abdominal hysterectomy           Social History:  She has been married for several years. Lives with her husband and one son. She continues to smoke half a pack of cigarettes per day. She does not drink alcohol.  Allergies:  Allergies  Allergen Reactions  . Sulfa Antibiotics     Mouth peels, blisters,          HQI:ONGEX from the symptoms mentioned above,there are no other symptoms referable to all systems  reviewed.  Physical Exam: Blood pressure 99/63, pulse 103, temperature 98.5 F (36.9 C), resp. rate 20, height 5\' 7"  (1.702 m), weight 68.04 kg (150 lb), SpO2 98.00%. She looks pale and chronically sick. Heart sounds are present and normal. Lung fields are clear. There is no supraclavicular lymphadenopathy. There is no hepatomegaly are no masses no abdomen. She appears to be alert and orientated without any focal neurological signs.    Basename 05/08/12 0948  WBC 9.1  NEUTROABS 7.8*  HGB 5.8*  HCT 18.8*  MCV 82.8  PLT 585*    Basename 05/08/12 0948  NA 128*  K 3.4*  CL 85*  CO2 32  GLUCOSE 154*  BUN 8  CREATININE 0.85  CALCIUM 9.4  MG --         Ct Angio Chest W/cm &/or Wo Cm  04/22/2012  *RADIOLOGY REPORT*  Clinical Data: Shortness of breath for 2 weeks.  Lung cancer.  CT ANGIOGRAPHY CHEST  Technique:  Multidetector CT imaging of the chest using the standard protocol during bolus administration of intravenous contrast. Multiplanar reconstructed images including MIPs were obtained and reviewed to evaluate the vascular anatomy.  Contrast: OMNIPAQUE IOHEXOL 350 MG/ML SOLN  Comparison: 04/22/2012  Findings: Prominent emphysema noted favoring the lung apices, with postoperative findings in the right thorax, including multiple right upper medial rib osteotomies, resulting in volume loss.  Soft tissue density  above the aortic arch in the left upper lobe measures approximately 1.8 x 1.1 cm, and could represent scarring, region of radiation therapy, or residual/recurrent malignancy.  A spiculated nodule in the right lower lobe on image 47 of series 7 measures 2.2 x 1.4 cm and is suspicious for malignancy.  Posterior soft tissue density along the right upper lung is probably due to scarring or radiation therapy.  Primarily linear densities with slightly nodular components are noted in the posterior basal segments of both lower lobes. Scarring/atelectasis favored.  Mild right heart  enlargement is present with preserved interventricular septal contour without reflux of contrast into the IVC.  No filling defect is identified in the pulmonary arterial tree to suggest pulmonary embolus.  Thoracic spondylosis noted.  Punctate scattered calcifications in the liver are probably from old granulomatous disease.  IMPRESSION: 1. No filling defect is identified in the pulmonary arterial tree to suggest pulmonary embolus. 2.  Prominent emphysema. 3.  Spiculated 2.2 cm right lower lobe nodule is particularly concerning for malignancy. 4.  Soft tissue density in the lung parenchyma of the aortic arch could represent scarring or malignancy. 5.  Soft tissue density along the right upper posterior pleural surface is most likely related to scarring or prior therapy, but requires monitoring in order to exclude cancer.  Alternatively, PET CT could be utilized to assess these abnormal regions. 6.  Abnormal right heart enlargement.  7.  Atherosclerosis.  Original Report Authenticated By: Dellia Cloud, M.D.   Dg Chest Portable 1 View  04/22/2012  *RADIOLOGY REPORT*  Clinical Data: Shortness of breath, cough and congestion.  Lung cancer.  PORTABLE CHEST - 1 VIEW  Comparison: None.  Findings: There are postoperative changes and volume loss in the right hemithorax.  Possible pleural edge at the apex of the right hemithorax.  Vertical lucencies are seen along the right paratracheal region.  Lungs are otherwise clear.  No pleural fluid.  IMPRESSION:  1.  Postoperative changes in the right hemithorax.  Question chronic right apical pleural air.  Difficult to definitively exclude right-sided pneumomediastinum.  Comparison with prior exams would be helpful.  In the absence of prior exams, CT chest with contrast would likely be helpful. 2.  Possible nodular density in the peripheral right midlung zone. Again, this can be further evaluated with chest CT.  Original Report Authenticated By: Reyes Ivan, M.D.    Impression: 1. Significant symptomatic anemia with a hemoglobin of 5.8, normocytic. Unclear etiology. 2. COPD, stable. 3. Hypertension, controlled. 4. History of lung cancer     Plan: 1. Admit. 2. Transfuse 2 units of blood. 3. Anemia panel. 4. Gastroenterology opinion with a view to EGD and colonoscopy.      Wilson Singer Pager [phone]  05/08/2012, 11:56 AM

## 2012-05-09 DIAGNOSIS — R634 Abnormal weight loss: Secondary | ICD-10-CM

## 2012-05-09 DIAGNOSIS — R112 Nausea with vomiting, unspecified: Secondary | ICD-10-CM

## 2012-05-09 LAB — CBC
HCT: 25.5 % — ABNORMAL LOW (ref 36.0–46.0)
MCH: 27.1 pg (ref 26.0–34.0)
MCH: 26.6 pg (ref 26.0–34.0)
MCHC: 31.8 g/dL (ref 30.0–36.0)
MCHC: 31.4 g/dL (ref 30.0–36.0)
MCV: 84.5 fL (ref 78.0–100.0)
MCV: 84.4 fL (ref 78.0–100.0)
Platelets: 461 10*3/uL — ABNORMAL HIGH (ref 150–400)
Platelets: 436 10*3/uL — ABNORMAL HIGH (ref 150–400)
RDW: 16.3 % — ABNORMAL HIGH (ref 11.5–15.5)
RDW: 16.3 % — ABNORMAL HIGH (ref 11.5–15.5)
RDW: 16.2 % — ABNORMAL HIGH (ref 11.5–15.5)
WBC: 8.5 10*3/uL (ref 4.0–10.5)

## 2012-05-09 LAB — COMPREHENSIVE METABOLIC PANEL
Albumin: 2.5 g/dL — ABNORMAL LOW (ref 3.5–5.2)
Alkaline Phosphatase: 101 U/L (ref 39–117)
BUN: 6 mg/dL (ref 6–23)
CO2: 34 mEq/L — ABNORMAL HIGH (ref 19–32)
Chloride: 95 mEq/L — ABNORMAL LOW (ref 96–112)
GFR calc non Af Amer: 85 mL/min — ABNORMAL LOW (ref >90)
Potassium: 3.8 mEq/L (ref 3.5–5.1)
Total Bilirubin: 0.2 mg/dL — ABNORMAL LOW (ref 0.3–1.2)

## 2012-05-09 LAB — TSH: TSH: 4.924 u[IU]/mL — ABNORMAL HIGH (ref 0.350–4.500)

## 2012-05-09 LAB — GLUCOSE, CAPILLARY
Glucose-Capillary: 136 mg/dL — ABNORMAL HIGH (ref 70–99)
Glucose-Capillary: 114 mg/dL — ABNORMAL HIGH (ref 70–99)

## 2012-05-09 LAB — IRON AND TIBC: Iron: <10 ug/dL — ABNORMAL LOW (ref 42–135)

## 2012-05-09 LAB — VITAMIN B12: Vitamin B-12: 1220 pg/mL — ABNORMAL HIGH (ref 211–911)

## 2012-05-09 LAB — FERRITIN: Ferritin: 10 ng/mL (ref 10–291)

## 2012-05-09 NOTE — Progress Notes (Signed)
Subjective: This lady was admitted yesterday with severe anemia. Iron studies show her to be-deficient. She feels somewhat better having had 2 units blood transfusion.           Physical Exam: Blood pressure 121/70, pulse 84, temperature 98.7 F (37.1 C), temperature source Oral, resp. rate 20, height 5\' 7"  (1.702 m), weight 68.04 kg (150 lb), SpO2 99.00%. She does look better. She is less pale. Heart sounds are present and normal. Lung fields are clear. Abdomen is soft and nontender.   Investigations:  No results found for this or any previous visit (from the past 240 hour(s)).   Basic Metabolic Panel:  Basename 05/09/12 0559 05/08/12 0948  NA 136 128*  K 3.8 3.4*  CL 95* 85*  CO2 34* 32  GLUCOSE 129* 154*  BUN 6 8  CREATININE 0.77 0.85  CALCIUM 9.0 9.4  MG -- --  PHOS -- --   Liver Function Tests:  Basename 05/09/12 0559 05/08/12 0948  AST 11 9  ALT 7 8  ALKPHOS 101 100  BILITOT 0.2* 0.1*  PROT 6.2 6.6  ALBUMIN 2.5* 2.7*     CBC:  Basename 05/09/12 0559 05/08/12 2216 05/08/12 0948  WBC 8.5 10.8* --  NEUTROABS -- -- 7.8*  HGB 8.4* 8.2* --  HCT 26.2* 25.5* --  MCV 84.5 83.6 --  PLT 461* 463* --    No results found.    Medications:  Scheduled:   . amitriptyline  50 mg Oral QHS  . arformoterol  15 mcg Nebulization BID  . atorvastatin  20 mg Oral Daily  . docusate sodium  100 mg Oral Daily  . ferrous sulfate  325 mg Oral Q breakfast  . fluticasone  2 spray Each Nare Daily  . furosemide  20 mg Oral BID  . levothyroxine  88 mcg Oral Daily  . lisinopril  10 mg Oral Daily  . pantoprazole  40 mg Oral Q1200  . sodium chloride      . theophylline  300 mg Oral BID  . tiotropium  18 mcg Inhalation Daily  . DISCONTD: furosemide  20 mg Oral BID    Impression: 1. Iron deficiency anemia, status post 2 units blood transfusion. History of ingestion of Goody powder recently. Likely has a peptic ulcer. 2. COPD, stable. 3. History of Lung cancer, being  followed [facility].. 4. Hypertension.     Plan: 1. Gastroenterology consultation. She is currently n.p.o. from this morning. She will likely need EGD today and if this is unrevealing, proceed to colonoscopy.     LOS: 1 day   Wilson Singer Pager [phone]  05/09/2012, 7:45 AM

## 2012-05-09 NOTE — Consult Note (Signed)
Reason for Consult:Chronic anemia Referring Physician: Hospitalist  Tracey Martinez is an 66 y.o. female.  HPI: Patient has been having issues with chronic anemia and intermittent abdominal tenderness, constipation over the couple months.  No emesis.  No change in BM other than constipation.  No melena, no hematochezia.  No change in urination.  Some weakness.  No fever or chills.  Last c-scope was in 2004 by Dr. Karilyn Cota.  Past Medical History  Diagnosis Date  . Aneurysm     brain  . Cancer   . Lung cancer   . GERD (gastroesophageal reflux disease)   . Hypertension   . Anxiety   . Oxygen dependent     requires oxygen for at least 16 hours per day,   . Hypothyroidism   . COPD (chronic obstructive pulmonary disease)     Past Surgical History  Procedure Date  . Lung removal, partial   . Abdominal hysterectomy     No family history on file.  Social History:  reports that she has been smoking.  She does not have any smokeless tobacco history on file. She reports that she does not drink alcohol or use illicit drugs.  Allergies:  Allergies  Allergen Reactions  . Sulfa Antibiotics     Mouth peels, blisters,     Medications:  I have reviewed the patient's current medications. Prior to Admission:  Prescriptions prior to admission  Medication Sig Dispense Refill  . acetaminophen (TYLENOL) 500 MG tablet Take 1,000 mg by mouth as needed. For pain      . albuterol (PROVENTIL HFA;VENTOLIN HFA) 108 (90 BASE) MCG/ACT inhaler Inhale 2 puffs into the lungs every 4 (four) hours as needed for wheezing.  1 each  0  . albuterol (PROVENTIL) (2.5 MG/3ML) 0.083% nebulizer solution Take 2.5 mg by nebulization 2 (two) times daily.       Marland Kitchen amitriptyline (ELAVIL) 25 MG tablet Take 50 mg by mouth at bedtime.      Marland Kitchen arformoterol (BROVANA) 15 MCG/2ML NEBU Take 15 mcg by nebulization 2 (two) times daily.      . Aspirin-Salicylamide-Caffeine (BC HEADACHE) 325-95-16 MG TABS Take 1 packet by mouth daily as  needed. For pain      . atorvastatin (LIPITOR) 20 MG tablet Take 20 mg by mouth every evening.      . busPIRone (BUSPAR) 10 MG tablet Take 10 mg by mouth 3 (three) times daily as needed. For anxiety      . CALCIUM-MAGNESIUM-ZINC PO Take 1 tablet by mouth daily.      . cholecalciferol (VITAMIN D) 1000 UNITS tablet Take 1,000 Units by mouth daily.      Marland Kitchen docusate sodium (COLACE) 100 MG capsule Take 100 mg by mouth daily.      Marland Kitchen etodolac (LODINE) 400 MG tablet Take 400-800 mg by mouth 2 (two) times daily as needed. For pain when Tylenol is ineffective      . ferrous sulfate 325 (65 FE) MG tablet Take 325 mg by mouth daily with breakfast.      . fluticasone (FLONASE) 50 MCG/ACT nasal spray Place 2 sprays into the nose every morning.      . furosemide (LASIX) 20 MG tablet Take 20 mg by mouth 2 (two) times daily.      Marland Kitchen levothyroxine (SYNTHROID, LEVOTHROID) 88 MCG tablet Take 88 mcg by mouth daily.      Marland Kitchen lisinopril (PRINIVIL,ZESTRIL) 20 MG tablet Take 10 mg by mouth every morning.      Marland Kitchen  omeprazole (PRILOSEC) 20 MG capsule Take 20 mg by mouth 2 (two) times daily.      . Potassium Gluconate 595 MG CAPS Take 1 capsule by mouth every morning.      . senna-docusate (SENNA PLUS) 8.6-50 MG per tablet Take 2 tablets by mouth daily as needed. Constipation      . theophylline (THEO-24) 300 MG 24 hr capsule Take 300 mg by mouth 2 (two) times daily.      Marland Kitchen tiotropium (SPIRIVA) 18 MCG inhalation capsule Place 18 mcg into inhaler and inhale daily.       Scheduled:   . amitriptyline  50 mg Oral QHS  . arformoterol  15 mcg Nebulization BID  . atorvastatin  20 mg Oral Daily  . docusate sodium  100 mg Oral Daily  . ferrous sulfate  325 mg Oral Q breakfast  . fluticasone  2 spray Each Nare Daily  . furosemide  20 mg Oral BID  . levothyroxine  88 mcg Oral Daily  . lisinopril  10 mg Oral Daily  . pantoprazole  40 mg Oral Q1200  . sodium chloride      . theophylline  300 mg Oral BID  . tiotropium  18 mcg  Inhalation Daily   Continuous:   . sodium chloride 100 mL/hr at 05/09/12 0620   ZOX:[id number], albuterol, albuterol, busPIRone, ondansetron (ZOFRAN) IV, ondansetron, senna-docusate  Results for orders placed during the hospital encounter of 05/08/12 (from the past 48 hour(s))  OCCULT BLOOD, POC DEVICE     Status: Normal   Collection Time   05/08/12  9:39 AM      Component Value Range Comment   Fecal Occult Bld POSITIVE     CBC     Status: Abnormal   Collection Time   05/08/12  9:48 AM      Component Value Range Comment   WBC 9.1  4.0 - 10.5 (K/uL)    RBC 2.27 (*) 3.87 - 5.11 (MIL/uL)    Hemoglobin 5.8 (*) 12.0 - 15.0 (g/dL)    HCT 14.7 (*) 82.9 - 46.0 (%)    MCV 82.8  78.0 - 100.0 (fL)    MCH 25.6 (*) 26.0 - 34.0 (pg)    MCHC 30.9  30.0 - 36.0 (g/dL)    RDW 56.2 (*) 13.0 - 15.5 (%)    Platelets 585 (*) 150 - 400 (K/uL)   DIFFERENTIAL     Status: Abnormal   Collection Time   05/08/12  9:48 AM      Component Value Range Comment   Neutrophils Relative 86 (*) 43 - 77 (%)    Neutro Abs 7.8 (*) 1.7 - 7.7 (K/uL)    Lymphocytes Relative 7 (*) 12 - 46 (%)    Lymphs Abs 0.7  0.7 - 4.0 (K/uL)    Monocytes Relative 6  3 - 12 (%)    Monocytes Absolute 0.5  0.1 - 1.0 (K/uL)    Eosinophils Relative 1  0 - 5 (%)    Eosinophils Absolute 0.1  0.0 - 0.7 (K/uL)    Basophils Relative 0  0 - 1 (%)    Basophils Absolute 0.0  0.0 - 0.1 (K/uL)   COMPREHENSIVE METABOLIC PANEL     Status: Abnormal   Collection Time   05/08/12  9:48 AM      Component Value Range Comment   Sodium 128 (*) 135 - 145 (mEq/L)    Potassium 3.4 (*) 3.5 - 5.1 (mEq/L)    Chloride 85 (*)  96 - 112 (mEq/L)    CO2 32  19 - 32 (mEq/L)    Glucose, Bld 154 (*) 70 - 99 (mg/dL)    BUN 8  6 - 23 (mg/dL)    Creatinine, Ser 1.61  0.50 - 1.10 (mg/dL)    Calcium 9.4  8.4 - 10.5 (mg/dL)    Total Protein 6.6  6.0 - 8.3 (g/dL)    Albumin 2.7 (*) 3.5 - 5.2 (g/dL)    AST 9  0 - 37 (U/L)    ALT 8  0 - 35 (U/L)    Alkaline Phosphatase  100  39 - 117 (U/L)    Total Bilirubin 0.1 (*) 0.3 - 1.2 (mg/dL)    GFR calc non Af Amer 69 (*) >90 (mL/min)    GFR calc Af Amer 80 (*) >90 (mL/min)   APTT     Status: Normal   Collection Time   05/08/12  9:48 AM      Component Value Range Comment   aPTT 30  24 - 37 (seconds)   PROTIME-INR     Status: Normal   Collection Time   05/08/12  9:48 AM      Component Value Range Comment   Prothrombin Time 13.6  11.6 - 15.2 (seconds)    INR 1.02  0.00 - 1.49    TROPONIN I     Status: Normal   Collection Time   05/08/12  9:48 AM      Component Value Range Comment   Troponin I <0.30  <0.30 (ng/mL)   PREPARE RBC (CROSSMATCH)     Status: Normal   Collection Time   05/08/12  9:48 AM      Component Value Range Comment   Order Confirmation ORDER PROCESSED BY BLOOD BANK     TSH     Status: Abnormal   Collection Time   05/08/12  9:48 AM      Component Value Range Comment   TSH 4.924 (*) 0.350 - 4.500 (uIU/mL)   VITAMIN B12     Status: Abnormal   Collection Time   05/08/12  9:48 AM      Component Value Range Comment   Vitamin B-12 1220 (*) 211 - 911 (pg/mL)   FOLATE     Status: Normal   Collection Time   05/08/12  9:48 AM      Component Value Range Comment   Folate 14.6     IRON AND TIBC     Status: Abnormal   Collection Time   05/08/12  9:48 AM      Component Value Range Comment   Iron <10 (*) 42 - 135 (ug/dL)    TIBC Not calculated due to Iron <10.  250 - 470 (ug/dL)    Saturation Ratios Not calculated due to Iron <10.  20 - 55 (%)    UIBC 308  125 - 400 (ug/dL)   FERRITIN     Status: Normal   Collection Time   05/08/12  9:48 AM      Component Value Range Comment   Ferritin 10  10 - 291 (ng/mL)   RETICULOCYTES     Status: Abnormal   Collection Time   05/08/12  9:48 AM      Component Value Range Comment   Retic Ct Pct 5.0 (*) 0.4 - 3.1 (%)    RBC. 2.26 (*) 3.87 - 5.11 (MIL/uL)    Retic Count, Manual 113.0  19.0 - 186.0 (K/uL)   TYPE AND SCREEN  Status: Normal   Collection Time   05/08/12   9:48 AM      Component Value Range Comment   ABO/RH(D) A POS      Antibody Screen NEG      Sample Expiration 05/11/2012      Unit Number [id number]      Blood Component Type RED CELLS,LR      Unit division 00      Status of Unit ISSUED,FINAL      Transfusion Status OK TO TRANSFUSE      Crossmatch Result Compatible      Unit Number [id number]      Blood Component Type RED CELLS,LR      Unit division 00      Status of Unit ISSUED,FINAL      Transfusion Status OK TO TRANSFUSE      Crossmatch Result Compatible     ABO/RH     Status: Normal   Collection Time   05/08/12  9:48 AM      Component Value Range Comment   ABO/RH(D) A POS     GLUCOSE, CAPILLARY     Status: Abnormal   Collection Time   05/08/12 12:04 PM      Component Value Range Comment   Glucose-Capillary 114 (*) 70 - 99 (mg/dL)   GLUCOSE, CAPILLARY     Status: Abnormal   Collection Time   05/08/12  5:53 PM      Component Value Range Comment   Glucose-Capillary 131 (*) 70 - 99 (mg/dL)   GLUCOSE, CAPILLARY     Status: Abnormal   Collection Time   05/08/12  8:47 PM      Component Value Range Comment   Glucose-Capillary 110 (*) 70 - 99 (mg/dL)    Comment 1 Notify RN     CBC     Status: Abnormal   Collection Time   05/08/12 10:16 PM      Component Value Range Comment   WBC 10.8 (*) 4.0 - 10.5 (K/uL)    RBC 3.05 (*) 3.87 - 5.11 (MIL/uL)    Hemoglobin 8.2 (*) 12.0 - 15.0 (g/dL) POST TRANSFUSION SPECIMEN   HCT 25.5 (*) 36.0 - 46.0 (%)    MCV 83.6  78.0 - 100.0 (fL)    MCH 26.9  26.0 - 34.0 (pg)    MCHC 32.2  30.0 - 36.0 (g/dL)    RDW 78.2 (*) 95.6 - 15.5 (%)    Platelets 463 (*) 150 - 400 (K/uL)   COMPREHENSIVE METABOLIC PANEL     Status: Abnormal   Collection Time   05/09/12  5:59 AM      Component Value Range Comment   Sodium 136  135 - 145 (mEq/L) DELTA CHECK NOTED   Potassium 3.8  3.5 - 5.1 (mEq/L)    Chloride 95 (*) 96 - 112 (mEq/L) DELTA CHECK NOTED   CO2 34 (*) 19 - 32 (mEq/L)    Glucose, Bld 129 (*) 70 - 99 (mg/dL)      BUN 6  6 - 23 (mg/dL)    Creatinine, Ser 2.13  0.50 - 1.10 (mg/dL)    Calcium 9.0  8.4 - 10.5 (mg/dL)    Total Protein 6.2  6.0 - 8.3 (g/dL)    Albumin 2.5 (*) 3.5 - 5.2 (g/dL)    AST 11  0 - 37 (U/L)    ALT 7  0 - 35 (U/L)    Alkaline Phosphatase 101  39 - 117 (U/L)    Total Bilirubin  0.2 (*) 0.3 - 1.2 (mg/dL)    GFR calc non Af Amer 85 (*) >90 (mL/min)    GFR calc Af Amer >90  >90 (mL/min)   CBC     Status: Abnormal   Collection Time   05/09/12  5:59 AM      Component Value Range Comment   WBC 8.5  4.0 - 10.5 (K/uL)    RBC 3.10 (*) 3.87 - 5.11 (MIL/uL)    Hemoglobin 8.4 (*) 12.0 - 15.0 (g/dL)    HCT 16.1 (*) 09.6 - 46.0 (%)    MCV 84.5  78.0 - 100.0 (fL)    MCH 27.1  26.0 - 34.0 (pg)    MCHC 32.1  30.0 - 36.0 (g/dL)    RDW 04.5 (*) 40.9 - 15.5 (%)    Platelets 461 (*) 150 - 400 (K/uL)   GLUCOSE, CAPILLARY     Status: Abnormal   Collection Time   05/09/12  7:27 AM      Component Value Range Comment   Glucose-Capillary 136 (*) 70 - 99 (mg/dL)   CBC     Status: Abnormal   Collection Time   05/09/12 10:06 AM      Component Value Range Comment   WBC 8.3  4.0 - 10.5 (K/uL) WHITE COUNT CONFIRMED ON SMEAR   RBC 3.02 (*) 3.87 - 5.11 (MIL/uL)    Hemoglobin 8.1 (*) 12.0 - 15.0 (g/dL)    HCT 81.1 (*) 91.4 - 46.0 (%)    MCV 84.4  78.0 - 100.0 (fL)    MCH 26.8  26.0 - 34.0 (pg)    MCHC 31.8  30.0 - 36.0 (g/dL)    RDW 78.2 (*) 95.6 - 15.5 (%)    Platelets 457 (*) 150 - 400 (K/uL)   GLUCOSE, CAPILLARY     Status: Abnormal   Collection Time   05/09/12 12:08 PM      Component Value Range Comment   Glucose-Capillary 129 (*) 70 - 99 (mg/dL)     No results found.  Review of Systems  Constitutional: Negative.   HENT: Negative.   Eyes: Negative.   Respiratory: Negative.   Cardiovascular: Negative.   Gastrointestinal: Positive for heartburn, abdominal pain (mild intermittent colicky tenderness.) and constipation. Negative for nausea, vomiting, diarrhea, blood in stool and melena.   Genitourinary: Negative.   Musculoskeletal: Negative.   Skin: Negative.   Neurological: Negative.   Endo/Heme/Allergies: Negative.   Psychiatric/Behavioral: Negative.    Blood pressure 121/70, pulse 84, temperature 98.7 F (37.1 C), temperature source Oral, resp. rate 20, height 5\' 7"  (1.702 m), weight 68.04 kg (150 lb), SpO2 99.00%. Physical Exam  Constitutional: She is oriented to person, place, and time. She appears well-developed and well-nourished. No distress.  HENT:  Head: Normocephalic and atraumatic.  Eyes: Conjunctivae and EOM are normal. Pupils are equal, round, and reactive to light.  Neck: Normal range of motion. Neck supple. No tracheal deviation present. No thyromegaly present.  Cardiovascular: Normal rate, regular rhythm and normal heart sounds.   Respiratory: Effort normal and breath sounds normal. No respiratory distress. She has no wheezes.  GI: Soft. Bowel sounds are normal. She exhibits no distension and no mass. There is no tenderness. There is no rebound and no guarding.  Lymphadenopathy:    She has no cervical adenopathy.  Neurological: She is alert and oriented to person, place, and time.  Skin: Skin is warm and dry.    Assessment/Plan: Chronic anemia.  Patient has responded appropriately to blood transfusion.  Continue to monitor.  Patient will need upper/lower endoscopy.  Will arrange with Dr. Karilyn Cota beginning of the week.  Tracey Martinez C 05/09/2012, 1:54 PM

## 2012-05-10 DIAGNOSIS — R112 Nausea with vomiting, unspecified: Secondary | ICD-10-CM

## 2012-05-10 DIAGNOSIS — R634 Abnormal weight loss: Secondary | ICD-10-CM

## 2012-05-10 LAB — COMPREHENSIVE METABOLIC PANEL
AST: 9 U/L (ref 0–37)
Albumin: 2.3 g/dL — ABNORMAL LOW (ref 3.5–5.2)
CO2: 32 mEq/L (ref 19–32)
Calcium: 8.8 mg/dL (ref 8.4–10.5)
Creatinine, Ser: 0.72 mg/dL (ref 0.50–1.10)
GFR calc non Af Amer: 87 mL/min — ABNORMAL LOW (ref >90)

## 2012-05-10 LAB — GLUCOSE, CAPILLARY: Glucose-Capillary: 125 mg/dL — ABNORMAL HIGH (ref 70–99)

## 2012-05-10 LAB — CBC
MCH: 26.2 pg (ref 26.0–34.0)
MCHC: 30.6 g/dL (ref 30.0–36.0)
Platelets: 419 10*3/uL — ABNORMAL HIGH (ref 150–400)
RDW: 16.3 % — ABNORMAL HIGH (ref 11.5–15.5)

## 2012-05-10 MED ORDER — POTASSIUM CHLORIDE IN NACL 40-0.9 MEQ/L-% IV SOLN
INTRAVENOUS | Status: DC
  Administered 2012-05-10 – 2012-05-12 (×4): via INTRAVENOUS
  Filled 2012-05-10 (×6): qty 1000

## 2012-05-10 NOTE — Progress Notes (Signed)
  Subjective: No acute change. No nausea vomiting. No complaints of abdominal pain.  Objective: Vital signs in last 24 hours: Temp:  [98.4 F (36.9 C)-98.6 F (37 C)] 98.6 F (37 C) (06/09 1124) Pulse Rate:  [83-96] 91  (06/09 1124) Resp:  [16-20] 18  (06/09 1124) BP: (113-126)/(63-73) 118/73 mmHg (06/09 1124) SpO2:  [97 %-100 %] 97 % (06/09 0953) Last BM Date: 05/03/12  Intake/Output from previous day: 06/08 0701 - 06/09 0700 In: 2226.7 [I.V.:2226.7] Out: -  Intake/Output this shift: Total I/O In: 850 [I.V.:500; Blood:350] Out: -   General appearance: alert and no distress GI: soft, non-tender; bowel sounds normal; no masses,  no organomegaly  Lab Results:   [facility] 05/10/12 0441 05/09/12 1856  WBC 7.1 7.8  HGB 7.6* 7.6*  HCT 24.8* 24.2*  PLT 419* 436*   BMET  Basename 05/10/12 0442 05/09/12 0559  NA 133* 136  K 3.0* 3.8  CL 94* 95*  CO2 32 34*  GLUCOSE 132* 129*  BUN 6 6  CREATININE 0.72 0.77  CALCIUM 8.8 9.0   PT/INR  Basename 05/08/12 0948  LABPROT 13.6  INR 1.02   ABG No results found for this basename: PHART:2,PCO2:2,PO2:2,HCO3:2 in the last 72 hours  Studies/Results: No results found.  Anti-infectives: Anti-infectives    None      Assessment/Plan: s/p * No surgery found * Chronic anemia. We'll start the patient on clear liquid diet tonight. We'll discuss possible endoscopy with Dr. Dionicia Abler tomorrow with likely earliest proceedings on Tuesday. She currently receiving additional blood. Continue to monitor her hemoglobin.  LOS: 2 days    Tracey Martinez C 05/10/2012

## 2012-05-10 NOTE — Progress Notes (Addendum)
Subjective: This lady was admitted yesterday with severe anemia. Iron studies show her to iron deficient She feels somewhat better having had 2 units blood transfusion. There is no nausea vomiting, hematemesis.           Physical Exam: Blood pressure 116/63, pulse 83, temperature 98.4 F (36.9 C), temperature source Oral, resp. rate 20, height 5\' 7"  (1.702 m), weight 68.04 kg (150 lb), SpO2 99.00%. She does look better. She is less pale. Heart sounds are present and normal. Lung fields are clear. Abdomen is soft and nontender.   Investigations:     Basic Metabolic Panel:  Basename 05/10/12 0442 05/09/12 0559  NA 133* 136  K 3.0* 3.8  CL 94* 95*  CO2 32 34*  GLUCOSE 132* 129*  BUN 6 6  CREATININE 0.72 0.77  CALCIUM 8.8 9.0  MG -- --  PHOS -- --   Liver Function Tests:  [facility] 05/10/12 0442 05/09/12 0559  AST 9 11  ALT 7 7  ALKPHOS 94 101  BILITOT 0.1* 0.2*  PROT 6.0 6.2  ALBUMIN 2.3* 2.5*     CBC:  Basename 05/10/12 0441 05/09/12 1856 05/08/12 0948  WBC 7.1 7.8 --  NEUTROABS -- -- 7.8*  HGB 7.6* 7.6* --  HCT 24.8* 24.2* --  MCV 85.5 84.6 --  PLT 419* 436* --    No results found.    Medications:  Scheduled:    . amitriptyline  50 mg Oral QHS  . arformoterol  15 mcg Nebulization BID  . atorvastatin  20 mg Oral Daily  . docusate sodium  100 mg Oral Daily  . ferrous sulfate  325 mg Oral Q breakfast  . fluticasone  2 spray Each Nare Daily  . furosemide  20 mg Oral BID  . levothyroxine  88 mcg Oral Daily  . lisinopril  10 mg Oral Daily  . pantoprazole  40 mg Oral Q1200  . theophylline  300 mg Oral BID  . tiotropium  18 mcg Inhalation Daily    Impression: 1. Iron deficiency anemia, status post 2 units blood transfusion. History of ingestion of Goody powder recently. Likely has a peptic ulcer. Hemoglobin less than 8 today. 2. COPD, stable. 3. History of Lung cancer, being followed at [facility] 4. Hypertension.     Plan: 1. Give  further 2 units of blood today. 2. Gastroenterology to see tomorrow with a view to EGD and possibly colonoscopy. We will keep her n.p.o. from midnight tonight.    LOS: 2 days   Wilson Singer Pager [phone]  05/10/2012, 7:47 AM

## 2012-05-11 ENCOUNTER — Encounter (HOSPITAL_COMMUNITY): Payer: Self-pay | Admitting: *Deleted

## 2012-05-11 ENCOUNTER — Encounter (HOSPITAL_COMMUNITY)
Admission: EM | Disposition: A | Discharge status: Home / Self Care | Payer: Self-pay | Source: Home / Self Care | Attending: Internal Medicine

## 2012-05-11 DIAGNOSIS — K2990 Gastroduodenitis, unspecified, without bleeding: Secondary | ICD-10-CM

## 2012-05-11 DIAGNOSIS — K922 Gastrointestinal hemorrhage, unspecified: Secondary | ICD-10-CM

## 2012-05-11 DIAGNOSIS — K2211 Ulcer of esophagus with bleeding: Secondary | ICD-10-CM

## 2012-05-11 DIAGNOSIS — R112 Nausea with vomiting, unspecified: Secondary | ICD-10-CM

## 2012-05-11 DIAGNOSIS — D509 Iron deficiency anemia, unspecified: Secondary | ICD-10-CM

## 2012-05-11 DIAGNOSIS — K219 Gastro-esophageal reflux disease without esophagitis: Secondary | ICD-10-CM

## 2012-05-11 DIAGNOSIS — K2970 Gastritis, unspecified, without bleeding: Secondary | ICD-10-CM

## 2012-05-11 DIAGNOSIS — R634 Abnormal weight loss: Secondary | ICD-10-CM

## 2012-05-11 HISTORY — PX: ESOPHAGOGASTRODUODENOSCOPY: SHX5428

## 2012-05-11 LAB — TYPE AND SCREEN
ABO/RH(D): A POS
Antibody Screen: NEGATIVE
Unit division: 0
Unit division: 0

## 2012-05-11 LAB — CBC
HCT: 31.5 % — ABNORMAL LOW (ref 36.0–46.0)
Hemoglobin: 10 g/dL — ABNORMAL LOW (ref 12.0–15.0)
MCHC: 31.7 g/dL (ref 30.0–36.0)
RDW: 16 % — ABNORMAL HIGH (ref 11.5–15.5)
WBC: 6.3 10*3/uL (ref 4.0–10.5)

## 2012-05-11 LAB — BASIC METABOLIC PANEL
Chloride: 96 mEq/L (ref 96–112)
GFR calc Af Amer: >90 mL/min (ref >90)
GFR calc non Af Amer: >90 mL/min (ref >90)
Potassium: 3.4 mEq/L — ABNORMAL LOW (ref 3.5–5.1)
Sodium: 136 mEq/L (ref 135–145)

## 2012-05-11 LAB — GLUCOSE, CAPILLARY
Glucose-Capillary: 123 mg/dL — ABNORMAL HIGH (ref 70–99)
Glucose-Capillary: 120 mg/dL — ABNORMAL HIGH (ref 70–99)
Glucose-Capillary: 139 mg/dL — ABNORMAL HIGH (ref 70–99)
Glucose-Capillary: 73 mg/dL (ref 70–99)
Glucose-Capillary: 99 mg/dL (ref 70–99)

## 2012-05-11 SURGERY — EGD (ESOPHAGOGASTRODUODENOSCOPY)
Anesthesia: Moderate Sedation

## 2012-05-11 MED ORDER — MIDAZOLAM HCL 5 MG/5ML IJ SOLN
INTRAMUSCULAR | Status: DC | PRN
  Administered 2012-05-11: 1 mg via INTRAVENOUS
  Administered 2012-05-11: 2 mg via INTRAVENOUS
  Administered 2012-05-11: 1 mg via INTRAVENOUS

## 2012-05-11 MED ORDER — SODIUM CHLORIDE 0.45 % IV SOLN
INTRAVENOUS | Status: DC
  Administered 2012-05-11: 09:00:00 via INTRAVENOUS

## 2012-05-11 MED ORDER — MEPERIDINE HCL 50 MG/ML IJ SOLN
INTRAMUSCULAR | Status: AC
  Filled 2012-05-11: qty 1

## 2012-05-11 MED ORDER — MIDAZOLAM HCL 5 MG/5ML IJ SOLN
INTRAMUSCULAR | Status: AC
  Filled 2012-05-11: qty 10

## 2012-05-11 MED ORDER — STERILE WATER FOR IRRIGATION IR SOLN
Status: DC | PRN
  Administered 2012-05-11: 09:00:00

## 2012-05-11 MED ORDER — MEPERIDINE HCL 25 MG/ML IJ SOLN
INTRAMUSCULAR | Status: DC | PRN
  Administered 2012-05-11: 25 mg via INTRAVENOUS

## 2012-05-11 MED ORDER — ALBUTEROL SULFATE (5 MG/ML) 0.5% IN NEBU
2.5000 mg | INHALATION_SOLUTION | Freq: Four times a day (QID) | RESPIRATORY_TRACT | Status: DC
  Administered 2012-05-11 – 2012-05-12 (×4): 2.5 mg via RESPIRATORY_TRACT
  Filled 2012-05-11 (×4): qty 0.5

## 2012-05-11 MED ORDER — PANTOPRAZOLE SODIUM 40 MG PO TBEC
40.0000 mg | DELAYED_RELEASE_TABLET | Freq: Two times a day (BID) | ORAL | Status: DC
  Administered 2012-05-11 – 2012-05-12 (×2): 40 mg via ORAL
  Filled 2012-05-11 (×5): qty 1

## 2012-05-11 MED ORDER — SUCRALFATE 1 GM/10ML PO SUSP
1.0000 g | Freq: Three times a day (TID) | ORAL | Status: DC
  Administered 2012-05-11 – 2012-05-12 (×5): 1 g via ORAL
  Filled 2012-05-11 (×10): qty 10

## 2012-05-11 NOTE — Progress Notes (Signed)
UR Chart Review Completed  

## 2012-05-11 NOTE — Care Management Note (Unsigned)
    Page 1 of 1   05/11/2012     2:50:49 PM   CARE MANAGEMENT NOTE 05/11/2012  Patient:  Tracey Martinez, Tracey Martinez   Account Number:  [id number]  Date Initiated:  05/11/2012  Documentation initiated by:  Anibal Henderson  Subjective/Objective Assessment:   Admitted with anemia, hgb  5.8. She is from home with spouse, is independent,  and will return home at D/C     Action/Plan:   No needs identified   Anticipated DC Date:  05/12/2012   Anticipated DC Plan:  HOME/SELF CARE      DC Planning Services  CM consult      Choice offered to / List presented to:             Status of service:  In process, will continue to follow Medicare Important Message given?   (If response is "NO", the following Medicare IM given date fields will be blank) Date Medicare IM given:   Date Additional Medicare IM given:    Discharge Disposition:    Per UR Regulation:  Reviewed for med. necessity/level of care/duration of stay  If discussed at Long Length of Stay Meetings, dates discussed:    Comments:  05/11/12 1000 Anibal Henderson RN

## 2012-05-11 NOTE — Op Note (Signed)
EGD PROCEDURE REPORT  PATIENT:  Tracey Martinez  MR#:  [id number] Birthdate:  1946/10/23, 66 y.o., female Endoscopist:  Dr. Malissa Hippo, MD Referred By:  Dr. Lowanda Foster, MD Procedure Date: 05/11/2012  Procedure:   EGD  Indications:  Patient is 66 year old female who presents with anemia heme positive stool. Studies revealed that she has iron deficiency anemia. Patient takes aspirin on most days and she is also on diclofenac. Patient has received 4 units of PRBCs and feels much better. He reports frequent heartburn despite taking PPI.            Informed Consent:  The risks, benefits, alternatives & imponderables which include, but are not limited to, bleeding, infection, perforation, drug reaction and potential missed lesion have been reviewed.  The potential for biopsy, lesion removal, esophageal dilation, etc. have also been discussed.  Questions have been answered.  All parties agreeable.  Please see history & physical in medical record for more information.  Medications:  Demerol 25 mg IV Versed 4 mg IV Cetacaine spray topically for oropharyngeal anesthesia  Description of procedure:  The endoscope was introduced through the mouth and advanced to the second portion of the duodenum without difficulty or limitations. The mucosal surfaces were surveyed very carefully during advancement of the scope and upon withdrawal.  Findings:  Esophagus:  Mucosa of the proximal esophagus was normal. In the mid esophagus there was a long linear ulcer covered with eschar. It was 4 cm long but did not extend to GE junction. GEJ:  40 cm Stomach:  Stomach was empty and distended very well with insufflation. Folds in the proximal stomach are normal. Examination mucosa at body was normal. In the antrum there is a single erosion. Pyloric channel was patent. Angularis fundus and cardia were examined by the flexor scope and were normal. Duodenum:  Two bulbar erosions along with erosion involving second part  of the duodenum.  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 Vomiting). Erosive gastroduodenitis. 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 Need to rule out central source and colon. Recommendations:  Change pantoprazole to 40 mg by mouth twice a day. Carafate liquid 1 g a.c. And each bedtime for 4 weeks. H. pylori serology. H&H in a.m.. No NSAIDs. Repeat EGD in [redacted] weeks along with colonoscopy.  Kenry Daubert U  05/11/2012  9:29 AM  CC: Dr. Ninfa Linden, NP, NP & Dr. No ref. provider found

## 2012-05-11 NOTE — Progress Notes (Signed)
Patient ambulated in hallway with stand by assistance from this RN. Patient ambulated with oxygen tank and tolerated well. Patient back in bed with call light in reach. Patient denies any other needs at this time. Will continue to monitor.

## 2012-05-11 NOTE — Progress Notes (Signed)
Subjective: Feeling better, no new complaints  Objective: Vital signs in last 24 hours: Temp:  [98.1 F (36.7 C)-98.8 F (37.1 C)] 98.1 F (36.7 C) (06/10 0948) Pulse Rate:  [83-96] 87  (06/10 0948) Resp:  [16-29] 20  (06/10 0948) BP: (110-144)/(60-89) 122/79 mmHg (06/10 0948) SpO2:  [90 %-99 %] 90 % (06/10 0948) Weight:  [68.04 kg (150 lb)] 68.04 kg (150 lb) (06/10 0846) Weight change:  Last BM Date: 05/10/12  Intake/Output from previous day: 06/09 0701 - 06/10 0700 In: 3572.5 [P.O.:840; I.V.:2032.5; Blood:700] Out: 1002 [Urine:1000; Stool:2] Total I/O In: 400 [I.V.:400] Out: -    Physical Exam: General: Alert, awake, oriented x3, in no acute distress. HEENT: No bruits, no goiter. Heart: Regular rate and rhythm, without murmurs, rubs, gallops. Lungs: coarse breath sounds b/l. Abdomen: Soft, nontender, nondistended, positive bowel sounds. Extremities: No clubbing cyanosis or edema with positive pedal pulses. Neuro: Grossly intact, nonfocal.    Lab Results: Basic Metabolic Panel:  Basename 05/11/12 0434 05/10/12 0442  NA 136 133*  K 3.4* 3.0*  CL 96 94*  CO2 35* 32  GLUCOSE 135* 132*  BUN 4* 6  CREATININE 0.64 0.72  CALCIUM 9.0 8.8  MG -- --  PHOS -- --   Liver Function Tests:  Basename 05/10/12 0442 05/09/12 0559  AST 9 11  ALT 7 7  ALKPHOS 94 101  BILITOT 0.1* 0.2*  PROT 6.0 6.2  ALBUMIN 2.3* 2.5*   No results found for this basename: LIPASE:2,AMYLASE:2 in the last 72 hours No results found for this basename: AMMONIA:2 in the last 72 hours CBC:  Basename 05/11/12 0434 05/10/12 0441  WBC 6.3 7.1  NEUTROABS -- --  HGB 10.0* 7.6*  HCT 31.5* 24.8*  MCV 84.7 85.5  PLT 350 419*   Cardiac Enzymes: No results found for this basename: CKTOTAL:3,CKMB:3,CKMBINDEX:3,TROPONINI:3 in the last 72 hours BNP: No results found for this basename: PROBNP:3 in the last 72 hours D-Dimer: No results found for this basename: DDIMER:2 in the last 72  hours CBG:  Basename 05/11/12 1148 05/11/12 0856 05/11/12 0736 05/10/12 2102 05/10/12 1630 05/10/12 1134  GLUCAP 139* 120* 123* 163* 107* 134*   Hemoglobin A1C: No results found for this basename: HGBA1C in the last 72 hours Fasting Lipid Panel: No results found for this basename: CHOL,HDL,LDLCALC,TRIG,CHOLHDL,LDLDIRECT in the last 72 hours Thyroid Function Tests: No results found for this basename: TSH,T4TOTAL,FREET4,T3FREE,THYROIDAB in the last 72 hours Anemia Panel: No results found for this basename: VITAMINB12,FOLATE,FERRITIN,TIBC,IRON,RETICCTPCT in the last 72 hours Coagulation: No results found for this basename: LABPROT:2,INR:2 in the last 72 hours Urine Drug Screen: Drugs of Abuse  No results found for this basename: labopia, cocainscrnur, labbenz, amphetmu, thcu, labbarb    Alcohol Level: No results found for this basename: ETH:2 in the last 72 hours Urinalysis: No results found for this basename: COLORURINE:2,APPERANCEUR:2,LABSPEC:2,PHURINE:2,GLUCOSEU:2,HGBUR:2,BILIRUBINUR:2,KETONESUR:2,PROTEINUR:2,UROBILINOGEN:2,NITRITE:2,LEUKOCYTESUR:2 in the last 72 hours  No results found for this or any previous visit (from the past 240 hour(s)).  Studies/Results: No results found.  Medications: Scheduled Meds:   . amitriptyline  50 mg Oral QHS  . arformoterol  15 mcg Nebulization BID  . atorvastatin  20 mg Oral Daily  . docusate sodium  100 mg Oral Daily  . ferrous sulfate  325 mg Oral Q breakfast  . fluticasone  2 spray Each Nare Daily  . furosemide  20 mg Oral BID  . levothyroxine  88 mcg Oral Daily  . lisinopril  10 mg Oral Daily  . meperidine      .  midazolam      . pantoprazole  40 mg Oral BID AC  . sucralfate  1 g Oral TID WC & HS  . theophylline  300 mg Oral BID  . tiotropium  18 mcg Inhalation Daily  . DISCONTD: pantoprazole  40 mg Oral Q1200   Continuous Infusions:   . 0.9 % NaCl with KCl 40 mEq / L 75 mL/hr at 05/11/12 0257  . DISCONTD: sodium chloride  20 mL/hr at 05/11/12 0853   PRN Meds:.acetaminophen, albuterol, albuterol, busPIRone, ondansetron (ZOFRAN) IV, ondansetron, senna-docusate, DISCONTD: meperidine, DISCONTD: midazolam, DISCONTD: simethicone susp in sterile water 1000 mL irrigation  Assessment/Plan:  Principal Problem:  *Anemia Active Problems:  COPD (chronic obstructive pulmonary disease)  Lung cancer  HTN (hypertension) Erosive gastroduodenitis  Plan:  Patient is s/p EGD today with results showing erosive gastroduodenitis. She has been put on protonix and carafate.  Her hemoglobin has responded well to transfusion. She has oxygen dependent COPD.  We will continue to advance her diet and consider discharge home tomorrow   LOS: 3 days   Jen Benedict [facility] Pager: [phone] 05/11/2012, 4:35 PM

## 2012-05-12 ENCOUNTER — Encounter (HOSPITAL_COMMUNITY): Payer: Self-pay | Admitting: Internal Medicine

## 2012-05-12 DIAGNOSIS — J438 Other emphysema: Secondary | ICD-10-CM

## 2012-05-12 DIAGNOSIS — R634 Abnormal weight loss: Secondary | ICD-10-CM

## 2012-05-12 DIAGNOSIS — J9610 Chronic respiratory failure, unspecified whether with hypoxia or hypercapnia: Secondary | ICD-10-CM

## 2012-05-12 DIAGNOSIS — R112 Nausea with vomiting, unspecified: Secondary | ICD-10-CM

## 2012-05-12 LAB — CBC
Hemoglobin: 10.3 g/dL — ABNORMAL LOW (ref 12.0–15.0)
MCHC: 31 g/dL (ref 30.0–36.0)
RDW: 15.8 % — ABNORMAL HIGH (ref 11.5–15.5)
WBC: 9.5 10*3/uL (ref 4.0–10.5)

## 2012-05-12 LAB — GLUCOSE, CAPILLARY: Glucose-Capillary: 130 mg/dL — ABNORMAL HIGH (ref 70–99)

## 2012-05-12 MED ORDER — SUCRALFATE 1 GM/10ML PO SUSP: 1.0000 g | Freq: Three times a day (TID) | ORAL | Status: DC

## 2012-05-12 MED ORDER — OMEPRAZOLE 40 MG PO CPDR: 40.0000 mg | DELAYED_RELEASE_CAPSULE | Freq: Two times a day (BID) | ORAL | Status: AC

## 2012-05-12 NOTE — Discharge Summary (Signed)
Physician Discharge Summary  Patient ID: Tracey Martinez MRN: [id number] DOB/AGE: [birth year] 67 y.o.  Admit date: 05/08/2012 Discharge date: 05/12/2012  Primary Care Physician:  Tracey Linden, NP, NP   Discharge Diagnoses:    Principal Problem:  *acute blood loss anemia, likely due to GI bleeding Active Problems: Gastric erosions, likely due to NSAID use  COPD (chronic obstructive pulmonary disease), oxygen dependent Chronic respiratory failure  Lung cancer  HTN (hypertension)    Medication List  As of 05/12/2012 11:48 AM   STOP taking these medications         BC HEADACHE 325-95-16 MG Tabs      etodolac 400 MG tablet      ferrous sulfate 325 (65 FE) MG tablet         TAKE these medications         acetaminophen 500 MG tablet   Commonly known as: TYLENOL   Take 1,000 mg by mouth as needed. For pain      albuterol (2.5 MG/3ML) 0.083% nebulizer solution   Commonly known as: PROVENTIL   Take 2.5 mg by nebulization 2 (two) times daily.      albuterol 108 (90 BASE) MCG/ACT inhaler   Commonly known as: PROVENTIL HFA;VENTOLIN HFA   Inhale 2 puffs into the lungs every 4 (four) hours as needed for wheezing.      amitriptyline 25 MG tablet   Commonly known as: ELAVIL   Take 50 mg by mouth at bedtime.      arformoterol 15 MCG/2ML Nebu   Commonly known as: BROVANA   Take 15 mcg by nebulization 2 (two) times daily.      atorvastatin 20 MG tablet   Commonly known as: LIPITOR   Take 20 mg by mouth every evening.      busPIRone 10 MG tablet   Commonly known as: BUSPAR   Take 10 mg by mouth 3 (three) times daily as needed. For anxiety      CALCIUM-MAGNESIUM-ZINC PO   Take 1 tablet by mouth daily.      cholecalciferol 1000 UNITS tablet   Commonly known as: VITAMIN D   Take 1,000 Units by mouth daily.      docusate sodium 100 MG capsule   Commonly known as: COLACE   Take 100 mg by mouth daily.      fluticasone 50 MCG/ACT nasal spray   Commonly known as: FLONASE     Place 2 sprays into the nose every morning.      furosemide 20 MG tablet   Commonly known as: LASIX   Take 20 mg by mouth 2 (two) times daily.      levothyroxine 88 MCG tablet   Commonly known as: SYNTHROID, LEVOTHROID   Take 88 mcg by mouth daily.      lisinopril 20 MG tablet   Commonly known as: PRINIVIL,ZESTRIL   Take 10 mg by mouth every morning.      omeprazole 40 MG capsule   Commonly known as: PRILOSEC   Take 1 capsule (40 mg total) by mouth 2 (two) times daily.      Potassium Gluconate 595 MG Caps   Take 1 capsule by mouth every morning.      SENNA PLUS 8.6-50 MG per tablet   Generic drug: senna-docusate   Take 2 tablets by mouth daily as needed. Constipation      sucralfate 1 GM/10ML suspension   Commonly known as: CARAFATE   Take 10 mLs (1 g total) by mouth 4 (  four) times daily -  with meals and at bedtime. For 4 weeks      theophylline 300 MG 24 hr capsule   Commonly known as: THEO-24   Take 300 mg by mouth 2 (two) times daily.      tiotropium 18 MCG inhalation capsule   Commonly known as: SPIRIVA   Place 18 mcg into inhaler and inhale daily.           Discharge Exam: Blood pressure 167/93, pulse 83, temperature 98.1 F (36.7 C), temperature source Oral, resp. rate 20, height 5\' 7"  (1.702 m), weight 68.04 kg (150 lb), SpO2 97.00%. NAD Rhonchi b/l S1, S2 RRR Soft, NT, BS+ No edema b/l   Disposition and Follow-up:  Discharge home, follow up with Dr. Karilyn Cota in 8 weeks for repeat endoscopy and colonoscopy  Consults:  GI, Dr. Karilyn Cota   Significant Diagnostic Studies:  No results found.  Brief H and P: For complete details please refer to admission H and P, but in brief This 66 year old lady has the above symptoms for approximately last one month. She was in the emergency room last couple weeks ago with similar symptoms. At this time she apparently improved and was discharged home. She comes again because she has not really gotten better. She has  not had any nausea, vomiting or hematemesis. She denies any rectal bleeding. She was found to have a hemoglobin today in the emergency room of 5.8. Blood has been ordered and she should be receiving 2 units in. She says her stools are black but they have been black since she's been on tablets for the last 10 years. She did have a colonoscopy in 2004 which was essentially normal. I cannot see evidence of an EGD.  She does have a history of lung cancer dating back to early 2001 her most recent review at [facility], they felt that everything was stable and not progressing.  She tells me that she has lost approximately 10 pounds in the last one month or so, unintentionally.  Ingesting Goody powder for the last one month.     Hospital Course:   This lady was admitted to the hospital with fatigue, dyspnea and severe anemia.  She was found to have a hemoglobin of 5.8. She was transfused a total of 4 units PRBC.  Her hemoglobin has improved to 10.3.  She feels significantly better.   She reports taking Goody powders for pain for the last month.  She was seen by Dr. Karilyn Cota who did an EGD and results showed Long linear ulcer at esophageal body covered with eschar but no stigmata of active bleed(injury secondary to ? Vomiting).  Erosive gastroduodenitis.  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 Need to rule out central source and colon Patient was advised to not take any NSAIDs.  She has been placed on BID PPI and carafate.  She will need repeat endoscopy in 8 weeks time and also a colonoscopy will be done at that time as well.  She will follow up with Dr. Karilyn Cota for this. Her respiratory status is at her baseline.  She feels ready to discharge home.  Time spent on Discharge:  Signed: Essie Lagunes [facility] Pager: [phone] 05/12/2012, 11:48 AM

## 2012-05-12 NOTE — Progress Notes (Signed)
Prescriptions given to patient.  

## 2012-05-12 NOTE — Progress Notes (Signed)
Patient in stable condition and transported out by tech.  

## 2012-05-12 NOTE — Progress Notes (Signed)
Subjective; Patient has no complaints. Stool less dark this morning. She denies heartburn or dysphagia. She also denies abdominal pain. Objective; BP 167/93  Pulse 83  Temp(Src) 98.1 F (36.7 C) (Oral)  Resp 20  Ht 5\' 7"  (1.702 m)  Wt 150 lb (68.04 kg)  BMI 23.49 kg/m2  SpO2 97% Abdomen is full but soft and nontender without organomegaly or masses. Lab data; WBC 9.5, hemoglobin 10.3, hematocrit 33.2, platelet count 366K. H. pylori serology is pending. Assessment; #1. Iron deficiency anemia with acute on chronic GI bleed. Patient has received 4 units of PRBCs hemoglobin is coming up. #2. Esophagitis and gastroduodenitis. Recommendations; Patient advised not to take aspirin or other NSAIDs. She can take Tylenol up to 2 g per day as needed. Should continue sucralfate for 4 weeks. Continue PPI twice a day schedule until next evaluation. Office visit in 2-3 weeks. My office will call patient. Patient advised not to take iron anymore and that is no stools turn black. EGD and colonoscopy in 8 weeks.

## 2012-05-12 NOTE — Progress Notes (Signed)
Discharge instructions reviewed with patient, patient voiced understanding. Patient given discharge instructions. Waiting on prescriptions from the doctor, Dr. Kerry Hough notified. Patient in stable condition. Will continue to monitor.

## 2012-05-12 NOTE — Discharge Instructions (Signed)
Gastrointestinal Bleeding  Bleeding in the gastrointestinal tract comes out when you throw up (vomit) or poop. Treatment will depend on how fast the blood is flowing, where it is coming from, and the cause. A small amount of bleeding that stops on its own may not need treatment.   HOME CARE   Do not drink alcohol.   Do not eat things that upset your stomach or give you heartburn.   Rest and limit your activity.   Do not smoke. Smoking may make your problems worse.   Wash your hands or use sanitizer every time you use the bathroom. Some bleeding is caused by germs.   Only take medicine as told by your doctor.  GET HELP RIGHT AWAY IF:    Your throw up looks like coffee grounds or is dark or bright red.   Your poop is black or tarry. You see blood in the toilet.   You feel weak, dizzy, and short of breath.   You breathe fast and have a fast heartbeat.   You have bad stomach pain or cramping.  MAKE SURE YOU:    Understand these instructions.   Will watch your condition.   Will get help right away if you are not doing well or get worse.  Document Released: 08/27/2008 Document Revised: 11/07/2011 Document Reviewed: 10/28/2011  [employer] Patient Information 2012 [employer].

## 2012-05-13 ENCOUNTER — Telehealth (INDEPENDENT_AMBULATORY_CARE_PROVIDER_SITE_OTHER): Payer: Self-pay | Admitting: *Deleted

## 2012-05-13 DIAGNOSIS — D649 Anemia, unspecified: Secondary | ICD-10-CM

## 2012-05-13 NOTE — Telephone Encounter (Signed)
Per Dr.Rehman the patient will need to have CBC/D in 2-3 weeks followed by office visit with Ms.Setzer or Dr.Rehman

## 2012-05-14 ENCOUNTER — Encounter (HOSPITAL_COMMUNITY): Payer: Self-pay | Admitting: Internal Medicine

## 2012-05-27 LAB — CBC WITH DIFFERENTIAL/PLATELET
Basophils Absolute: 0 10*3/uL (ref 0.0–0.1)
Basophils Relative: 0 % (ref 0–1)
Eosinophils Absolute: 0 10*3/uL (ref 0.0–0.7)
Eosinophils Relative: 0 % (ref 0–5)
MCH: 26.3 pg (ref 26.0–34.0)
MCHC: 32.5 g/dL (ref 30.0–36.0)
MCV: 80.8 fL (ref 78.0–100.0)
Neutrophils Relative %: 83 % — ABNORMAL HIGH (ref 43–77)
Platelets: 487 10*3/uL — ABNORMAL HIGH (ref 150–400)
RDW: 16.4 % — ABNORMAL HIGH (ref 11.5–15.5)

## 2012-06-02 ENCOUNTER — Ambulatory Visit (INDEPENDENT_AMBULATORY_CARE_PROVIDER_SITE_OTHER): Payer: Medicare Other | Admitting: Internal Medicine

## 2012-06-02 ENCOUNTER — Encounter (INDEPENDENT_AMBULATORY_CARE_PROVIDER_SITE_OTHER): Payer: Self-pay | Admitting: Internal Medicine

## 2012-06-02 VITALS — BP 138/80 | HR 80 | Temp 98.2°F | Ht 67.0 in | Wt 135.5 lb

## 2012-06-02 DIAGNOSIS — K922 Gastrointestinal hemorrhage, unspecified: Secondary | ICD-10-CM | POA: Insufficient documentation

## 2012-06-02 DIAGNOSIS — D649 Anemia, unspecified: Secondary | ICD-10-CM

## 2012-06-02 NOTE — Progress Notes (Signed)
Subjective:     Patient ID: Tracey Martinez, female   DOB: [birth year], 66 y.o.   MRN: [id number]  HPICarol is a 65 yr old female here today for f/u after recently undergoing an EGD June 10 for anemia and heme postive stools. She had been taking a lot of BC powder's for pain.Her stodues revealed that she had iron deficiency anemia.  She was taking aspirin on most days and she was also on diclofenac.  She received 4 units of PRBCs while in the hospital.  H. Pylori negative. She is not taking any NSAIDs. She is taking Tylenol. Appetite is pretty good. Six month ago she weighed 160. Now she weight 135.5. No abdominal pain. Stools x 1 a day. No melena or bright red rectal bleeding.  Please see EGD below.  CBC    Component Value Date/Time   WBC 10.3 05/26/2012 1130   RBC 4.68 05/26/2012 1130   HGB 12.3 05/26/2012 1130   HCT 37.8 05/26/2012 1130   PLT 487* 05/26/2012 1130   MCV 80.8 05/26/2012 1130   MCH 26.3 05/26/2012 1130   MCHC 32.5 05/26/2012 1130   RDW 16.4* 05/26/2012 1130   LYMPHSABS 0.9 05/26/2012 1130   MONOABS 0.7 05/26/2012 1130   EOSABS 0.0 05/26/2012 1130   BASOSABS 0.0 05/26/2012 1130       EGD/  EGD PROCEDURE REPORT  PATIENT: Tracey Martinez MR#: [id number]  Birthdate: [birth year], 65 y.o., female  Endoscopist: Dr. Malissa Hippo, MD  Referred By: Dr. Lowanda Foster, MD  Procedure Date: 05/11/2012  Procedure: EGD  Indications: Patient is 66 year old female who presents with anemia heme positive stool. Studies revealed that she has iron deficiency anemia. Patient takes aspirin on most days and she is also on diclofenac. Patient has received 4 units of PRBCs and feels much better. He reports frequent heartburn despite taking PPI.    Findings Long linear ulcer at esophageal body covered with eschar but no stigmata of active bleed(injury secondary to ? Vomiting).  Erosive gastroduodenitis.  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 Need to rule out central  source and colon.  Recommendations:  Change pantoprazole to 40 mg by mouth twice a day.  Carafate liquid 1 g a.c. And each bedtime for 4 weeks.  H. pylori serology.  H&H in a.m..  No NSAIDs.  Repeat EGD in [redacted] weeks along with colonoscopy.  REHMAN,NAJEEB U 05/11/2012 9:29 AM  CC: Dr. Ninfa Linden, NP, NP & Dr. No ref. provider found       Current Outpatient Prescriptions  Medication Sig Dispense Refill  . acetaminophen (TYLENOL) 500 MG tablet Take 1,000 mg by mouth as needed. For pain      . albuterol (PROVENTIL HFA;VENTOLIN HFA) 108 (90 BASE) MCG/ACT inhaler Inhale 2 puffs into the lungs every 4 (four) hours as needed for wheezing.  1 each  0  . albuterol (PROVENTIL) (2.5 MG/3ML) 0.083% nebulizer solution Take 2.5 mg by nebulization 2 (two) times daily.       Marland Kitchen amitriptyline (ELAVIL) 25 MG tablet Take 50 mg by mouth at bedtime.      Marland Kitchen arformoterol (BROVANA) 15 MCG/2ML NEBU Take 15 mcg by nebulization 2 (two) times daily.      Marland Kitchen atorvastatin (LIPITOR) 20 MG tablet Take 20 mg by mouth every evening.      . busPIRone (BUSPAR) 10 MG tablet Take 10 mg by mouth 3 (three) times daily as needed. For anxiety      .  CALCIUM-MAGNESIUM-ZINC PO Take 1 tablet by mouth daily.      . cholecalciferol (VITAMIN D) 1000 UNITS tablet Take 1,000 Units by mouth daily.      Marland Kitchen docusate sodium (COLACE) 100 MG capsule Take 100 mg by mouth daily.      . fluticasone (FLONASE) 50 MCG/ACT nasal spray Place 2 sprays into the nose every morning.      . furosemide (LASIX) 20 MG tablet Take 20 mg by mouth 2 (two) times daily.      Marland Kitchen levothyroxine (SYNTHROID, LEVOTHROID) 88 MCG tablet Take 88 mcg by mouth daily.      Marland Kitchen lisinopril (PRINIVIL,ZESTRIL) 20 MG tablet Take 10 mg by mouth every morning.      Marland Kitchen omeprazole (PRILOSEC) 40 MG capsule Take 1 capsule (40 mg total) by mouth 2 (two) times daily.  60 capsule  2  . Potassium Gluconate 595 MG CAPS Take 1 capsule by mouth every morning.      . senna-docusate (SENNA PLUS)  8.6-50 MG per tablet Take 2 tablets by mouth daily as needed. Constipation      . sucralfate (CARAFATE) 1 GM/10ML suspension Take 10 mLs (1 g total) by mouth 4 (four) times daily -  with meals and at bedtime. For 4 weeks  420 mL  2  . sucralfate (CARAFATE) 1 GM/10ML suspension Take 1 g by mouth 4 (four) times daily.      . theophylline (THEO-24) 300 MG 24 hr capsule Take 300 mg by mouth 2 (two) times daily.      Marland Kitchen tiotropium (SPIRIVA) 18 MCG inhalation capsule Place 18 mcg into inhaler and inhale daily.       Past Medical History  Diagnosis Date  . Aneurysm     brain  . Cancer   . Lung cancer   . GERD (gastroesophageal reflux disease)   . Hypertension   . Anxiety   . Oxygen dependent     requires oxygen for at least 16 hours per day,   . Hypothyroidism   . COPD (chronic obstructive pulmonary disease)     on home oxygen  . Anemia     iron deficiency anemia  . Gastric erosions     causing anemia  . Chronic respiratory failure   . Anemia   . Gastric ulcer    Past Surgical History  Procedure Date  . Lung removal, partial   . Abdominal hysterectomy   . Esophagogastroduodenoscopy 05/11/2012    Procedure: ESOPHAGOGASTRODUODENOSCOPY (EGD);  Surgeon: Malissa Hippo, MD;  Location: [facility];  Service: Endoscopy;  Laterality: N/A;   History   Social History  . Marital Status: Married    Spouse Name: N/A    Number of Children: N/A  . Years of Education: N/A   Occupational History  . Not on file.   Social History Main Topics  . Smoking status: Current Some Day Smoker -- 0.2 packs/day for 55 years  . Smokeless tobacco: Not on file  . Alcohol Use: No  . Drug Use: No  . Sexually Active: Not on file   Other Topics Concern  . Not on file   Social History Narrative  . No narrative on file   Family Status  Relation Status Death Age  . Mother Deceased     AGE  . Father Deceased     age 74  . Sister Alive     good health  . Brother Alive     One deceased from  cancer. Rest are  in good health   Allergies  Allergen Reactions  . Sulfa Antibiotics     Mouth peels, blisters,     Review of Systems see hpi    Objective:   Physical Exam Filed Vitals:   06/02/12 1150  Height: 5\' 7"  (1.702 m)  Weight: 135 lb 8 oz (61.462 kg)  Alert and oriented. Skin warm and dry. Oral mucosa is moist.   . Sclera anicteric, conjunctivae is pink. Thyroid not enlarged. No cervical lymphadenopathy. Lungs clear. Heart regular rate and rhythm.  Abdomen is soft. Bowel sounds are positive. No hepatomegaly. No abdominal masses felt. No tenderness.  No edema to lower extremities.        Assessment:    Anemia: CBC within normal limits at present. Long linear ulcer at esophageal body covered with eschar but no stigmata of active bleed(injury secondary to ? Vomiting).  Erosive gastroduodenitis.  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 Need to rule out central source and colon.  Recommendations:      Plan:  Continue Omeprazole 40mg  BID. Continue Carafate. Repeat EGD and colonoscopy the first of August. No NSAIDs.

## 2012-06-02 NOTE — Patient Instructions (Addendum)
Continue Omeprazole BID. Continue Carafate. EGD and Colonoscopy first of August. No NSAIDS.

## 2012-06-09 ENCOUNTER — Other Ambulatory Visit (INDEPENDENT_AMBULATORY_CARE_PROVIDER_SITE_OTHER): Payer: Self-pay | Admitting: *Deleted

## 2012-06-09 ENCOUNTER — Telehealth (INDEPENDENT_AMBULATORY_CARE_PROVIDER_SITE_OTHER): Payer: Self-pay | Admitting: *Deleted

## 2012-06-09 DIAGNOSIS — Z1211 Encounter for screening for malignant neoplasm of colon: Secondary | ICD-10-CM

## 2012-06-09 DIAGNOSIS — R195 Other fecal abnormalities: Secondary | ICD-10-CM

## 2012-06-09 DIAGNOSIS — D649 Anemia, unspecified: Secondary | ICD-10-CM

## 2012-06-09 MED ORDER — PEG-KCL-NACL-NASULF-NA ASC-C 100 G PO SOLR: 1.0000 | Freq: Once | ORAL | Status: DC

## 2012-06-09 NOTE — Telephone Encounter (Signed)
Patient needs movi prep 

## 2012-06-19 ENCOUNTER — Encounter (HOSPITAL_COMMUNITY): Payer: Self-pay | Admitting: Pharmacy Technician

## 2012-07-02 ENCOUNTER — Ambulatory Visit (HOSPITAL_COMMUNITY)
Admission: RE | Admit: 2012-07-02 | Discharge: 2012-07-02 | Disposition: A | Discharge status: Home / Self Care | Payer: Medicare Other | Source: Ambulatory Visit | Attending: Internal Medicine | Admitting: Internal Medicine

## 2012-07-02 ENCOUNTER — Encounter (HOSPITAL_COMMUNITY): Payer: Self-pay | Admitting: *Deleted

## 2012-07-02 ENCOUNTER — Encounter (HOSPITAL_COMMUNITY)
Admission: RE | Disposition: A | Discharge status: Home / Self Care | Payer: Self-pay | Source: Ambulatory Visit | Attending: Internal Medicine

## 2012-07-02 DIAGNOSIS — Z79899 Other long term (current) drug therapy: Secondary | ICD-10-CM | POA: Insufficient documentation

## 2012-07-02 DIAGNOSIS — Q2733 Arteriovenous malformation of digestive system vessel: Secondary | ICD-10-CM

## 2012-07-02 DIAGNOSIS — D126 Benign neoplasm of colon, unspecified: Secondary | ICD-10-CM

## 2012-07-02 DIAGNOSIS — K5730 Diverticulosis of large intestine without perforation or abscess without bleeding: Secondary | ICD-10-CM

## 2012-07-02 DIAGNOSIS — K31819 Angiodysplasia of stomach and duodenum without bleeding: Secondary | ICD-10-CM | POA: Insufficient documentation

## 2012-07-02 DIAGNOSIS — Z1211 Encounter for screening for malignant neoplasm of colon: Secondary | ICD-10-CM | POA: Insufficient documentation

## 2012-07-02 DIAGNOSIS — I10 Essential (primary) hypertension: Secondary | ICD-10-CM | POA: Insufficient documentation

## 2012-07-02 DIAGNOSIS — Z862 Personal history of diseases of the blood and blood-forming organs and certain disorders involving the immune mechanism: Secondary | ICD-10-CM

## 2012-07-02 DIAGNOSIS — D649 Anemia, unspecified: Secondary | ICD-10-CM

## 2012-07-02 DIAGNOSIS — R195 Other fecal abnormalities: Secondary | ICD-10-CM

## 2012-07-02 DIAGNOSIS — J449 Chronic obstructive pulmonary disease, unspecified: Secondary | ICD-10-CM | POA: Insufficient documentation

## 2012-07-02 DIAGNOSIS — Z09 Encounter for follow-up examination after completed treatment for conditions other than malignant neoplasm: Secondary | ICD-10-CM | POA: Insufficient documentation

## 2012-07-02 DIAGNOSIS — J4489 Other specified chronic obstructive pulmonary disease: Secondary | ICD-10-CM | POA: Insufficient documentation

## 2012-07-02 DIAGNOSIS — K2211 Ulcer of esophagus with bleeding: Secondary | ICD-10-CM

## 2012-07-02 DIAGNOSIS — K2210 Ulcer of esophagus without bleeding: Secondary | ICD-10-CM | POA: Insufficient documentation

## 2012-07-02 DIAGNOSIS — K644 Residual hemorrhoidal skin tags: Secondary | ICD-10-CM | POA: Insufficient documentation

## 2012-07-02 HISTORY — DX: Prediabetes: R73.03

## 2012-07-02 SURGERY — COLONOSCOPY WITH ESOPHAGOGASTRODUODENOSCOPY (EGD)
Anesthesia: Moderate Sedation

## 2012-07-02 MED ORDER — MIDAZOLAM HCL 5 MG/5ML IJ SOLN
INTRAMUSCULAR | Status: AC
  Filled 2012-07-02: qty 10

## 2012-07-02 MED ORDER — MEPERIDINE HCL 50 MG/ML IJ SOLN
INTRAMUSCULAR | Status: AC
  Filled 2012-07-02: qty 1

## 2012-07-02 MED ORDER — SIMETHICONE 40 MG/0.6ML PO SUSP
ORAL | Status: DC | PRN
  Administered 2012-07-02: 13:00:00

## 2012-07-02 MED ORDER — MIDAZOLAM HCL 5 MG/5ML IJ SOLN
INTRAMUSCULAR | Status: DC | PRN
  Administered 2012-07-02: 2 mg via INTRAVENOUS
  Administered 2012-07-02: 1 mg via INTRAVENOUS
  Administered 2012-07-02: 2 mg via INTRAVENOUS

## 2012-07-02 MED ORDER — SODIUM CHLORIDE 0.45 % IV SOLN
Freq: Once | INTRAVENOUS | Status: AC
  Administered 2012-07-02: 20 mL/h via INTRAVENOUS

## 2012-07-02 MED ORDER — BUTAMBEN-TETRACAINE-BENZOCAINE 2-2-14 % EX AERO
INHALATION_SPRAY | CUTANEOUS | Status: DC | PRN
  Administered 2012-07-02: 2 via TOPICAL

## 2012-07-02 MED ORDER — MEPERIDINE HCL 50 MG/ML IJ SOLN
INTRAMUSCULAR | Status: DC | PRN
  Administered 2012-07-02 (×2): 25 mg via INTRAVENOUS

## 2012-07-02 NOTE — Op Note (Signed)
EGD AND COLONOSCOPY PROCEDURE REPORT  PATIENT:  Tracey Martinez  MR#:  [id number] Birthdate:  02/27/1946, 66 y.o., female Endoscopist:  Dr. Malissa Hippo, MD Referred By:  Ms. Ninfa Linden, NP Procedure Date: 07/02/2012  Procedure:   EGD & Colonoscopy  Indications:  Patient is 66 year old African female with multiple medical problems was admitted this facility 8 weeks ago with melena hemoglobin of 5.8 g. Iron studies were consistent with iron deficiency anemia. EGD revealed a long white linear ulcer felt to be secondary to heaving and vomiting. She's been treated with PPI and sucralfate. She is returning for EGD to document healing of this ulcer followed by colonoscopy. Her last exam was over 9 years ago.           Informed Consent:  The risks, benefits, alternatives & imponderables which include, but are not limited to, bleeding, infection, perforation, drug reaction and potential missed lesion have been reviewed.  The potential for biopsy, lesion removal, esophageal dilation, etc. have also been discussed.  Questions have been answered.  All parties agreeable.  Please see history & physical in medical record for more information.  Medications:  Demerol 50 mg IV Versed 5 mg IV Cetacaine spray topically for oropharyngeal anesthesia  EGD  Description of procedure:  The endoscope was introduced through the mouth and advanced to the second portion of the duodenum without difficulty or limitations. The mucosal surfaces were surveyed very carefully during advancement of the scope and upon withdrawal.  Findings:  Esophagus:  Mucosa of the esophagus was normal. Previously identified long white ulcer had completely healed. GEJ:  43 cm Stomach:  Stomach was empty and distended very well insufflation. Folds in the proximal stomach are normal. Examination mucosa at body, antrum, pyloric channel, angularis, fundus and cardia was normal. Duodenum:  There was a single small arteriovenous malformation in  the bulb without stigmata of bleed. Post bulbar duodenal mucosa was normal.  Therapeutic/Diagnostic Maneuvers Performed:  None  COLONOSCOPY Description of procedure:  After a digital rectal exam was performed, that colonoscope was advanced from the anus through the rectum and colon to the area of the cecum, ileocecal valve and appendiceal orifice. The cecum was deeply intubated. These structures were well-seen and photographed for the record. From the level of the cecum and ileocecal valve, the scope was slowly and cautiously withdrawn. The mucosal surfaces were carefully surveyed utilizing scope tip to flexion to facilitate fold flattening as needed. The scope was pulled down into the rectum where a thorough exam including retroflexion was performed.  Findings:   Prep suboptimal with liquid informed pieces of stool scattered throughout the colon. Scattered diverticula throughout the colon. Small polyp ablated via cold biopsy from cecum. Another 6-7 mm polyp snared from cecum. Abnormal segment of mucosa at proximal sigmoid colon luminal narrowing involving two thirds of the circumference. this area was biopsied and very suspicious for neoplasm. Normal rectal mucosa. Small hemorrhoids proximal to dentate line.  Therapeutic/Diagnostic Maneuvers Performed:  See above.  Complications:  None  Cecal Withdrawal Time:  32 minutes  Impression:  Esophageal ulcer has completely healed. Mall nonbleeding duodenal AV malformation. 2 cecal polyps removed Maller one was removed via cold biopsy and the larger one was 6-7 mm was snared. Lesion at proximal sigmoid colon with luminal narrowing suspicious for neoplasm. Biopsy taken. Pancolonic diverticulosis. Internal hemorrhoids.  Recommendations:  Standard instructions given. I will contact patient with results of biopsy and further recommendations.    Jinelle Butchko U  07/02/2012 1:57 PM  CC: Dr. Ninfa Linden, NP & Dr. Bonnetta Barry ref. provider  found

## 2012-07-02 NOTE — H&P (Signed)
Tracey Martinez is an 66 y.o. female.   Chief Complaint: Patient is here for esophagogastroduodenoscopy and colonoscopy. HPI: Patient is 66 year old female with multiple medical problems was admitted to this facility but 8 weeks ago with melena anemia and in studies confirmed iron deficiency. She had a large linear ulcer esophagus felt to be source of acute GI bleed. She's been to Carafate and PPI. She is on returning for EGD to document healing of this ulcer. She is also undergoing colonoscopy. She has lost colonoscopy was over 9 years ago. She denies odynophagia or dysphagia. She denies abdominal pain melena or rectal bleeding.  Past Medical History  Diagnosis Date  . Aneurysm     brain  . Cancer   . Lung cancer   . GERD (gastroesophageal reflux disease)   . Hypertension   . Anxiety   . Oxygen dependent     requires oxygen for at least 16 hours per day,   . Hypothyroidism   . COPD (chronic obstructive pulmonary disease)     on home oxygen  . Anemia     iron deficiency anemia  . Gastric erosions     causing anemia  . Chronic respiratory failure   . Anemia   . Gastric ulcer   . Borderline diabetes     Past Surgical History  Procedure Date  . Lung removal, partial   . Abdominal hysterectomy   . Esophagogastroduodenoscopy 05/11/2012    Procedure: ESOPHAGOGASTRODUODENOSCOPY (EGD);  Surgeon: Malissa Hippo, MD;  Location: [facility];  Service: Endoscopy;  Laterality: N/A;  . Cataract extraction, bilateral     History reviewed. No pertinent family history. Social History:  reports that she has been smoking.  She does not have any smokeless tobacco history on file. She reports that she does not drink alcohol or use illicit drugs.  Allergies:  Allergies  Allergen Reactions  . Sulfa Antibiotics Other (See Comments)    HANDS,FEET AND MOUTH BLISTERING    Medications Prior to Admission  Medication Sig Dispense Refill  . albuterol (PROVENTIL HFA;VENTOLIN HFA) 108 (90 BASE)  MCG/ACT inhaler Inhale 2 puffs into the lungs every 4 (four) hours as needed for wheezing.  1 each  0  . amitriptyline (ELAVIL) 25 MG tablet Take 50 mg by mouth at bedtime.      Marland Kitchen arformoterol (BROVANA) 15 MCG/2ML NEBU Take 15 mcg by nebulization 2 (two) times daily.      . busPIRone (BUSPAR) 10 MG tablet Take 10 mg by mouth 3 (three) times daily as needed. For anxiety      . CALCIUM-MAGNESIUM-ZINC PO Take 1 tablet by mouth every morning.       . cholecalciferol (VITAMIN D) 1000 UNITS tablet Take 1,000 Units by mouth every morning.       . docusate sodium (COLACE) 100 MG capsule Take 200 mg by mouth every morning.       . fluticasone (FLONASE) 50 MCG/ACT nasal spray Place 2 sprays into the nose every morning.      Marland Kitchen levothyroxine (SYNTHROID, LEVOTHROID) 88 MCG tablet Take 88 mcg by mouth daily before breakfast.       . lisinopril (PRINIVIL,ZESTRIL) 20 MG tablet Take 10 mg by mouth every morning.      Marland Kitchen omeprazole (PRILOSEC) 40 MG capsule Take 1 capsule (40 mg total) by mouth 2 (two) times daily.  60 capsule  2  . peg 3350 powder (MOVIPREP) 100 G SOLR Take 1 kit (100 g total) by mouth once.  1  kit  0  . Potassium Gluconate 595 MG CAPS Take 1 capsule by mouth every morning.      . senna-docusate (SENNA PLUS) 8.6-50 MG per tablet Take 2 tablets by mouth daily as needed. Constipation      . theophylline (THEO-24) 300 MG 24 hr capsule Take 300 mg by mouth 2 (two) times daily.      Marland Kitchen tiotropium (SPIRIVA) 18 MCG inhalation capsule Place 18 mcg into inhaler and inhale daily.      Marland Kitchen acetaminophen (TYLENOL) 500 MG tablet Take 1,000 mg by mouth every 4 (four) hours as needed. For pain      . albuterol (PROVENTIL) (2.5 MG/3ML) 0.083% nebulizer solution Take 2.5 mg by nebulization 2 (two) times daily.       . furosemide (LASIX) 20 MG tablet Take 20 mg by mouth 2 (two) times daily.      . sucralfate (CARAFATE) 1 GM/10ML suspension Take 1 g by mouth 4 (four) times daily -  before meals and at bedtime.          No results found for this or any previous visit (from the past 48 hour(s)). No results found.  ROS  Blood pressure 135/85, pulse 98, temperature 98.4 F (36.9 C), temperature source Oral, resp. rate 20, SpO2 96.00%. Physical Exam  Constitutional: She appears well-developed and well-nourished.  HENT:  Mouth/Throat: Oropharynx is clear and moist.  Eyes: Conjunctivae are normal. No scleral icterus.  Neck: No thyromegaly present.  Cardiovascular: Normal rate, regular rhythm and normal heart sounds.   No murmur heard. Respiratory: Effort normal and breath sounds normal.       Few coarse rhonchi at lung bases  Musculoskeletal: She exhibits no edema.  Lymphadenopathy:    She has no cervical adenopathy.  Neurological: She is alert.  Skin: Skin is warm and dry.     Assessment/Plan History of large linear esophageal ulcer. Iron deficiency anemia. EGD and colonoscopy.  Dj Senteno U 07/02/2012, 12:33 PM

## 2012-07-08 ENCOUNTER — Other Ambulatory Visit (INDEPENDENT_AMBULATORY_CARE_PROVIDER_SITE_OTHER): Payer: Self-pay | Admitting: Internal Medicine

## 2012-07-08 ENCOUNTER — Encounter (INDEPENDENT_AMBULATORY_CARE_PROVIDER_SITE_OTHER): Payer: Self-pay | Admitting: *Deleted

## 2012-07-08 DIAGNOSIS — Z0189 Encounter for other specified special examinations: Secondary | ICD-10-CM

## 2012-07-08 DIAGNOSIS — D126 Benign neoplasm of colon, unspecified: Secondary | ICD-10-CM

## 2012-07-09 LAB — CREATININE, SERUM: Creat: 0.64 mg/dL (ref 0.50–1.10)

## 2012-07-10 ENCOUNTER — Telehealth: Payer: Self-pay | Admitting: Urgent Care

## 2012-07-10 ENCOUNTER — Ambulatory Visit (HOSPITAL_COMMUNITY)
Admission: RE | Admit: 2012-07-10 | Discharge: 2012-07-10 | Disposition: A | Discharge status: Home / Self Care | Payer: Medicare Other | Source: Ambulatory Visit | Attending: Internal Medicine | Admitting: Internal Medicine

## 2012-07-10 ENCOUNTER — Inpatient Hospital Stay (HOSPITAL_COMMUNITY)
Admission: EM | Admit: 2012-07-10 | Discharge: 2012-07-12 | DRG: 373 | Disposition: A | Discharge status: Home / Self Care | Payer: Medicare Other | Attending: Internal Medicine | Admitting: Internal Medicine

## 2012-07-10 ENCOUNTER — Encounter (HOSPITAL_COMMUNITY): Payer: Self-pay | Admitting: *Deleted

## 2012-07-10 DIAGNOSIS — Z9981 Dependence on supplemental oxygen: Secondary | ICD-10-CM

## 2012-07-10 DIAGNOSIS — F17200 Nicotine dependence, unspecified, uncomplicated: Secondary | ICD-10-CM | POA: Diagnosis present

## 2012-07-10 DIAGNOSIS — E039 Hypothyroidism, unspecified: Secondary | ICD-10-CM | POA: Diagnosis present

## 2012-07-10 DIAGNOSIS — F411 Generalized anxiety disorder: Secondary | ICD-10-CM | POA: Diagnosis present

## 2012-07-10 DIAGNOSIS — Z8249 Family history of ischemic heart disease and other diseases of the circulatory system: Secondary | ICD-10-CM

## 2012-07-10 DIAGNOSIS — Z79899 Other long term (current) drug therapy: Secondary | ICD-10-CM

## 2012-07-10 DIAGNOSIS — R7309 Other abnormal glucose: Secondary | ICD-10-CM | POA: Diagnosis present

## 2012-07-10 DIAGNOSIS — J449 Chronic obstructive pulmonary disease, unspecified: Secondary | ICD-10-CM | POA: Diagnosis present

## 2012-07-10 DIAGNOSIS — C3490 Malignant neoplasm of unspecified part of unspecified bronchus or lung: Secondary | ICD-10-CM

## 2012-07-10 DIAGNOSIS — D509 Iron deficiency anemia, unspecified: Secondary | ICD-10-CM | POA: Diagnosis present

## 2012-07-10 DIAGNOSIS — K8020 Calculus of gallbladder without cholecystitis without obstruction: Secondary | ICD-10-CM | POA: Insufficient documentation

## 2012-07-10 DIAGNOSIS — Z882 Allergy status to sulfonamides status: Secondary | ICD-10-CM

## 2012-07-10 DIAGNOSIS — R933 Abnormal findings on diagnostic imaging of other parts of digestive tract: Secondary | ICD-10-CM | POA: Insufficient documentation

## 2012-07-10 DIAGNOSIS — D126 Benign neoplasm of colon, unspecified: Secondary | ICD-10-CM

## 2012-07-10 DIAGNOSIS — I10 Essential (primary) hypertension: Secondary | ICD-10-CM | POA: Diagnosis present

## 2012-07-10 DIAGNOSIS — Z720 Tobacco use: Secondary | ICD-10-CM

## 2012-07-10 DIAGNOSIS — K922 Gastrointestinal hemorrhage, unspecified: Secondary | ICD-10-CM

## 2012-07-10 DIAGNOSIS — D649 Anemia, unspecified: Secondary | ICD-10-CM

## 2012-07-10 DIAGNOSIS — N151 Renal and perinephric abscess: Secondary | ICD-10-CM

## 2012-07-10 DIAGNOSIS — Z902 Acquired absence of lung [part of]: Secondary | ICD-10-CM

## 2012-07-10 DIAGNOSIS — J4489 Other specified chronic obstructive pulmonary disease: Secondary | ICD-10-CM | POA: Diagnosis present

## 2012-07-10 DIAGNOSIS — K5730 Diverticulosis of large intestine without perforation or abscess without bleeding: Secondary | ICD-10-CM | POA: Diagnosis present

## 2012-07-10 DIAGNOSIS — Z833 Family history of diabetes mellitus: Secondary | ICD-10-CM

## 2012-07-10 DIAGNOSIS — K219 Gastro-esophageal reflux disease without esophagitis: Secondary | ICD-10-CM | POA: Diagnosis present

## 2012-07-10 DIAGNOSIS — K651 Peritoneal abscess: Principal | ICD-10-CM | POA: Diagnosis present

## 2012-07-10 DIAGNOSIS — Z85118 Personal history of other malignant neoplasm of bronchus and lung: Secondary | ICD-10-CM

## 2012-07-10 LAB — COMPREHENSIVE METABOLIC PANEL
ALT: 6 U/L (ref 0–35)
AST: 11 U/L (ref 0–37)
Albumin: 3.2 g/dL — ABNORMAL LOW (ref 3.5–5.2)
CO2: 33 mEq/L — ABNORMAL HIGH (ref 19–32)
Calcium: 9.6 mg/dL (ref 8.4–10.5)
Chloride: 95 mEq/L — ABNORMAL LOW (ref 96–112)
GFR calc non Af Amer: 90 mL/min — ABNORMAL LOW (ref >90)
Sodium: 134 mEq/L — ABNORMAL LOW (ref 135–145)
Total Bilirubin: 0.2 mg/dL — ABNORMAL LOW (ref 0.3–1.2)

## 2012-07-10 LAB — URINALYSIS, ROUTINE W REFLEX MICROSCOPIC
Bilirubin Urine: NEGATIVE
Glucose, UA: NEGATIVE mg/dL
Hgb urine dipstick: NEGATIVE
Ketones, ur: NEGATIVE mg/dL
Protein, ur: NEGATIVE mg/dL
Urobilinogen, UA: 0.2 mg/dL (ref 0.0–1.0)

## 2012-07-10 LAB — CBC WITH DIFFERENTIAL/PLATELET
Basophils Absolute: 0 10*3/uL (ref 0.0–0.1)
Basophils Relative: 0 % (ref 0–1)
Lymphocytes Relative: 19 % (ref 12–46)
MCHC: 30.9 g/dL (ref 30.0–36.0)
Neutro Abs: 4.1 10*3/uL (ref 1.7–7.7)
Platelets: 228 10*3/uL (ref 150–400)
RDW: 18.1 % — ABNORMAL HIGH (ref 11.5–15.5)
WBC: 5.7 10*3/uL (ref 4.0–10.5)

## 2012-07-10 MED ORDER — ALBUTEROL SULFATE (5 MG/ML) 0.5% IN NEBU
5.0000 mg | INHALATION_SOLUTION | Freq: Once | RESPIRATORY_TRACT | Status: AC
  Administered 2012-07-10: 5 mg via RESPIRATORY_TRACT
  Filled 2012-07-10: qty 1

## 2012-07-10 MED ORDER — ALBUTEROL SULFATE (5 MG/ML) 0.5% IN NEBU
2.5000 mg | INHALATION_SOLUTION | Freq: Two times a day (BID) | RESPIRATORY_TRACT | Status: DC
  Administered 2012-07-10 – 2012-07-12 (×4): 2.5 mg via RESPIRATORY_TRACT
  Filled 2012-07-10 (×4): qty 0.5

## 2012-07-10 MED ORDER — ACETAMINOPHEN 325 MG PO TABS
650.0000 mg | ORAL_TABLET | Freq: Once | ORAL | Status: AC
  Administered 2012-07-10: 650 mg via ORAL
  Filled 2012-07-10: qty 2

## 2012-07-10 MED ORDER — PANTOPRAZOLE SODIUM 40 MG PO TBEC
80.0000 mg | DELAYED_RELEASE_TABLET | Freq: Two times a day (BID) | ORAL | Status: DC
  Administered 2012-07-11 – 2012-07-12 (×3): 80 mg via ORAL
  Filled 2012-07-10 (×3): qty 2

## 2012-07-10 MED ORDER — IOHEXOL 300 MG/ML  SOLN
100.0000 mL | Freq: Once | INTRAMUSCULAR | Status: AC | PRN
  Administered 2012-07-10: 100 mL via INTRAVENOUS

## 2012-07-10 MED ORDER — POTASSIUM CHLORIDE IN NACL 20-0.9 MEQ/L-% IV SOLN
INTRAVENOUS | Status: DC
  Administered 2012-07-10: 50 mL/h via INTRAVENOUS

## 2012-07-10 MED ORDER — DOCUSATE SODIUM 100 MG PO CAPS
200.0000 mg | ORAL_CAPSULE | Freq: Every evening | ORAL | Status: DC
  Administered 2012-07-10 – 2012-07-11 (×2): 200 mg via ORAL
  Filled 2012-07-10 (×2): qty 2

## 2012-07-10 MED ORDER — ACETAMINOPHEN 500 MG PO TABS
1000.0000 mg | ORAL_TABLET | ORAL | Status: DC | PRN
  Administered 2012-07-10 – 2012-07-12 (×4): 1000 mg via ORAL
  Filled 2012-07-10 (×4): qty 2

## 2012-07-10 MED ORDER — PIPERACILLIN-TAZOBACTAM 3.375 G IVPB
3.3750 g | Freq: Once | INTRAVENOUS | Status: AC
  Administered 2012-07-10: 3.375 g via INTRAVENOUS
  Filled 2012-07-10: qty 50

## 2012-07-10 MED ORDER — VENLAFAXINE HCL ER 75 MG PO CP24
300.0000 mg | ORAL_CAPSULE | Freq: Every day | ORAL | Status: DC
  Administered 2012-07-11 – 2012-07-12 (×2): 300 mg via ORAL
  Filled 2012-07-10 (×2): qty 4

## 2012-07-10 MED ORDER — BISACODYL 5 MG PO TBEC: 5.0000 mg | DELAYED_RELEASE_TABLET | Freq: Every day | ORAL | Status: DC | PRN

## 2012-07-10 MED ORDER — BUSPIRONE HCL 5 MG PO TABS: 10.0000 mg | ORAL_TABLET | Freq: Three times a day (TID) | ORAL | Status: DC | PRN

## 2012-07-10 MED ORDER — THEOPHYLLINE ER 300 MG PO CP24
300.0000 mg | ORAL_CAPSULE | Freq: Two times a day (BID) | ORAL | Status: DC
  Filled 2012-07-10 (×6): qty 1

## 2012-07-10 MED ORDER — THEOPHYLLINE ER 300 MG PO TB12
300.0000 mg | ORAL_TABLET | Freq: Two times a day (BID) | ORAL | Status: DC
  Administered 2012-07-10 – 2012-07-12 (×4): 300 mg via ORAL
  Filled 2012-07-10 (×3): qty 1

## 2012-07-10 MED ORDER — ALBUTEROL SULFATE (5 MG/ML) 0.5% IN NEBU: 2.5000 mg | INHALATION_SOLUTION | Freq: Four times a day (QID) | RESPIRATORY_TRACT | Status: DC | PRN

## 2012-07-10 MED ORDER — TIOTROPIUM BROMIDE MONOHYDRATE 18 MCG IN CAPS
18.0000 ug | ORAL_CAPSULE | Freq: Every day | RESPIRATORY_TRACT | Status: DC
  Administered 2012-07-11 – 2012-07-12 (×2): 18 ug via RESPIRATORY_TRACT
  Filled 2012-07-10: qty 5

## 2012-07-10 MED ORDER — PIPERACILLIN-TAZOBACTAM 3.375 G IVPB
3.3750 g | Freq: Three times a day (TID) | INTRAVENOUS | Status: DC
  Administered 2012-07-11 – 2012-07-12 (×4): 3.375 g via INTRAVENOUS
  Filled 2012-07-10 (×8): qty 50

## 2012-07-10 MED ORDER — SODIUM CHLORIDE 0.9 % IV SOLN
INTRAVENOUS | Status: DC
  Administered 2012-07-10: 16:00:00 via INTRAVENOUS

## 2012-07-10 MED ORDER — LISINOPRIL 10 MG PO TABS
10.0000 mg | ORAL_TABLET | ORAL | Status: DC
  Administered 2012-07-11 – 2012-07-12 (×2): 10 mg via ORAL
  Filled 2012-07-10 (×2): qty 1

## 2012-07-10 MED ORDER — FLEET ENEMA 7-19 GM/118ML RE ENEM: 1.0000 | ENEMA | Freq: Once | RECTAL | Status: AC | PRN

## 2012-07-10 MED ORDER — GUAIFENESIN ER 600 MG PO TB12
1200.0000 mg | ORAL_TABLET | Freq: Two times a day (BID) | ORAL | Status: DC
  Administered 2012-07-10 – 2012-07-12 (×4): 1200 mg via ORAL
  Filled 2012-07-10 (×4): qty 2

## 2012-07-10 MED ORDER — FLUTICASONE PROPIONATE 50 MCG/ACT NA SUSP
2.0000 | NASAL | Status: DC
  Administered 2012-07-11 – 2012-07-12 (×2): 2 via NASAL
  Filled 2012-07-10: qty 16

## 2012-07-10 MED ORDER — OXYCODONE HCL 5 MG PO TABS: 5.0000 mg | ORAL_TABLET | ORAL | Status: DC | PRN

## 2012-07-10 MED ORDER — HYDROMORPHONE HCL PF 1 MG/ML IJ SOLN: 0.5000 mg | INTRAMUSCULAR | Status: DC | PRN

## 2012-07-10 MED ORDER — ARFORMOTEROL TARTRATE 15 MCG/2ML IN NEBU
15.0000 ug | INHALATION_SOLUTION | Freq: Two times a day (BID) | RESPIRATORY_TRACT | Status: DC
  Administered 2012-07-11 – 2012-07-12 (×2): 15 ug via RESPIRATORY_TRACT
  Filled 2012-07-10 (×5): qty 2

## 2012-07-10 MED ORDER — SODIUM CHLORIDE 0.9 % IV SOLN: INTRAVENOUS | Status: DC

## 2012-07-10 MED ORDER — ONDANSETRON HCL 4 MG/2ML IJ SOLN: 4.0000 mg | Freq: Four times a day (QID) | INTRAMUSCULAR | Status: DC | PRN

## 2012-07-10 MED ORDER — SODIUM CHLORIDE 0.9 % IV BOLUS (SEPSIS)
700.0000 mL | Freq: Once | INTRAVENOUS | Status: AC
  Administered 2012-07-10: 18:00:00 via INTRAVENOUS

## 2012-07-10 MED ORDER — IPRATROPIUM BROMIDE 0.02 % IN SOLN
0.5000 mg | Freq: Once | RESPIRATORY_TRACT | Status: AC
  Administered 2012-07-10: 0.5 mg via RESPIRATORY_TRACT
  Filled 2012-07-10: qty 2.5

## 2012-07-10 MED ORDER — AMITRIPTYLINE HCL 25 MG PO TABS
50.0000 mg | ORAL_TABLET | Freq: Every day | ORAL | Status: DC
  Administered 2012-07-10 – 2012-07-11 (×2): 50 mg via ORAL
  Filled 2012-07-10 (×2): qty 2

## 2012-07-10 MED ORDER — LEVOTHYROXINE SODIUM 88 MCG PO TABS
88.0000 ug | ORAL_TABLET | Freq: Every day | ORAL | Status: DC
  Administered 2012-07-11 – 2012-07-12 (×2): 88 ug via ORAL
  Filled 2012-07-10 (×3): qty 1

## 2012-07-10 NOTE — Telephone Encounter (Signed)
Spoke w/ Dr Steele Berg.  Perinephric abscess approx 3 cm.   Spoke w/ pt. C/o left-sided abd pain started 1 week after colonoscopy. Pain 5/10 on pain scale.  Intermittent.  Denies nausea, vomiting or fever. Has noticed urinary hesitancy.  Denies dysuria or hematuria. Pt does not want to come to hospital if at all possible.

## 2012-07-10 NOTE — H&P (Signed)
[facility] History and Physical  TANAJA GANGER NFA:[id number] DOB: [birth year] DOA: 07/10/2012   PCP:   Ninfa Linden, NP   Chief Complaint:  Left flank tenderness for about one week  HPI: Tracey Martinez is an 66 y.o. female.   Pleasant African American lady with and EGD on August 1, to verify healing of her recent esophageal lesion, and at the same time routine colonoscopy. : Colonoscopy prep was poor, but polyps were identified and, and malignant-appearing sigmoid colon was biopsied. A shunt has been having left flank pain this since soon after colonoscopy, and when the biopsies were all returned as benign a followup CT scan of the abdomen and was ordered which revealed collection of inflammatory fluid around the kidney and splenic flexure, about 3 cm in largest diameter and the patient was referred to the emergency room for evaluation.  The patient denies any fever or chills, denies frequency or dysuria. She denies any further black or bloody stool. Urinalysis in the emergency room was normal.  She has a remote history of surgery for lung cancer some 12 years ago, and has had yearly negative followups at [facility]. She uses her nebulizer twice per day, but has chronic ongoing to low-grade tobacco use.  Rewiew of Systems:   All systems negative except as marked bold or noted in the HPI;  Constitutional: Negative for malaise, fever and chills. ;  Eyes: Negative for eye pain, redness and discharge. ;  ENMT: Negative for ear pain, hoarseness, nasal congestion, sinus pressure and sore throat. ;  Cardiovascular: Negative for chest pain, palpitations, diaphoresis, dyspnea and peripheral edema. ;  Respiratory: Negative for cough, hemoptysis, wheezing and stridor. ;  Gastrointestinal: Negative for nausea, vomiting, diarrhea, constipation,, melena, blood in stool, hematemesis, jaundice and rectal bleeding. unusual weight loss..   Genitourinary: Negative for frequency, dysuria,  incontinence, and hematuria; Musculoskeletal: Negative for back pain and neck pain. Negative for swelling and trauma.;  Skin: . Negative for pruritus, rash, abrasions, bruising and skin lesion.; ulcerations Neuro: Negative for headache, lightheadedness and neck stiffness. Negative for weakness, altered level of consciousness , altered mental status, extremity weakness, burning feet, involuntary movement, seizure and syncope.  Psych: negative for anxiety, depression, insomnia, tearfulness, panic attacks, hallucinations, paranoia, suicidal or homicidal ideation    Past Medical History  Diagnosis Date  . Aneurysm     brain  . GERD (gastroesophageal reflux disease)   . Hypertension   . Anxiety   . Oxygen dependent     requires oxygen for at least 16 hours per day,   . Hypothyroidism   . COPD (chronic obstructive pulmonary disease)     on home oxygen  . Anemia     iron deficiency anemia  . Gastric erosions     causing anemia  . Chronic respiratory failure   . Anemia   . Gastric ulcer   . Borderline diabetes   . Cancer   . Lung cancer     Past Surgical History  Procedure Date  . Lung removal, partial   . Abdominal hysterectomy   . Esophagogastroduodenoscopy 05/11/2012    Procedure: ESOPHAGOGASTRODUODENOSCOPY (EGD);  Surgeon: Malissa Hippo, MD;  Location: [facility];  Service: Endoscopy;  Laterality: N/A;  . Cataract extraction, bilateral     Medications:  HOME MEDS: Prior to Admission medications   Medication Sig Start Date End Date Taking? Authorizing Provider  acetaminophen (TYLENOL) 500 MG tablet Take 1,000 mg by mouth every 4 (four) hours as needed. For  pain   Yes Historical Provider, MD  albuterol (PROVENTIL) (2.5 MG/3ML) 0.083% nebulizer solution Take 2.5 mg by nebulization 2 (two) times daily.    Yes Historical Provider, MD  amitriptyline (ELAVIL) 25 MG tablet Take 50 mg by mouth at bedtime.   Yes Historical Provider, MD  arformoterol (BROVANA) 15 MCG/2ML NEBU  Take 15 mcg by nebulization 2 (two) times daily.   Yes Historical Provider, MD  busPIRone (BUSPAR) 10 MG tablet Take 10 mg by mouth 3 (three) times daily as needed. For anxiety   Yes Historical Provider, MD  CALCIUM-MAGNESIUM-ZINC PO Take 1 tablet by mouth every morning.    Yes Historical Provider, MD  cholecalciferol (VITAMIN D) 1000 UNITS tablet Take 1,000 Units by mouth every morning.    Yes Historical Provider, MD  docusate sodium (COLACE) 100 MG capsule Take 200 mg by mouth every evening.    Yes Historical Provider, MD  fluticasone (FLONASE) 50 MCG/ACT nasal spray Place 2 sprays into the nose every morning.   Yes Historical Provider, MD  furosemide (LASIX) 20 MG tablet Take 20 mg by mouth every morning.    Yes Historical Provider, MD  levothyroxine (SYNTHROID, LEVOTHROID) 88 MCG tablet Take 88 mcg by mouth daily before breakfast.    Yes Historical Provider, MD  lisinopril (PRINIVIL,ZESTRIL) 20 MG tablet Take 10 mg by mouth every morning.   Yes Historical Provider, MD  omeprazole (PRILOSEC) 40 MG capsule Take 1 capsule (40 mg total) by mouth 2 (two) times daily. 05/12/12  Yes Erick Blinks, MD  Potassium Gluconate 595 MG CAPS Take 1 capsule by mouth every morning.   Yes Historical Provider, MD  theophylline (THEO-24) 300 MG 24 hr capsule Take 300 mg by mouth 2 (two) times daily.   Yes Historical Provider, MD  tiotropium (SPIRIVA) 18 MCG inhalation capsule Place 18 mcg into inhaler and inhale daily.   Yes Historical Provider, MD  venlafaxine XR (EFFEXOR-XR) 150 MG 24 hr capsule Take 300 mg by mouth daily with breakfast.   Yes Historical Provider, MD  senna-docusate (SENNA PLUS) 8.6-50 MG per tablet Take 2 tablets by mouth at bedtime as needed. Constipation    Historical Provider, MD     Allergies:  Allergies  Allergen Reactions  . Sulfa Antibiotics Other (See Comments)    HANDS,FEET AND MOUTH BLISTERING    Social History:   reports that she has been smoking.  She does not have any  smokeless tobacco history on file. She reports that she does not drink alcohol or use illicit drugs.  Family History: Family History  Problem Relation Age of Onset  . Hypertension Mother   . Hypertension Father   . Diabetes Mother   . Cancer Brother   . Diabetes Brother   . Diabetes Brother      Physical Exam: Filed Vitals:   07/10/12 1407 07/10/12 1409 07/10/12 1720 07/10/12 1910  BP: 131/72  137/86 157/90  Pulse: 97  101 99  Temp: 98.8 F (37.1 C)  98.7 F (37.1 C) 98.5 F (36.9 C)  TempSrc: Oral  Oral Oral  Resp: 18  16 20   Height:    5\' 7"  (1.702 m)  Weight:    62.4 kg (137 lb 9.1 oz)  SpO2: 98% 98% 100% 98%   Blood pressure 157/90, pulse 99, temperature 98.5 F (36.9 C), temperature source Oral, resp. rate 20, height 5\' 7"  (1.702 m), weight 62.4 kg (137 lb 9.1 oz), SpO2 98.00%.  GEN:  Pleasant elderly African American lady  lying in  the stretcher in no acute distress; cooperative with exam PSYCH:  alert and oriented x4; does not appear anxious or depressed; affect is appropriate. HEENT: Mucous membranes pink and anicteric; PERRLA; EOM intact; no cervical lymphadenopathy nor thyromegaly or carotid bruit; no JVD; Breasts:: Not examined CHEST WALL: No tenderness CHEST: Normal respiration, mild rhonchi bilaterally HEART: Regular rate and rhythm; no murmurs rubs or gallops BACK: Moderate kyphosis, moderate scoliosis; no CVA tenderness ABDOMEN: Obese, left anterior flank tenderness; no masses, no organomegaly, normal abdominal bowel sounds; no intertriginous candida. Rectal Exam: Not done EXTREMITIES: ; age-appropriate arthropathy of the hands and knees; no edema; no ulcerations. Genitalia: not examined PULSES: 2+ and symmetric SKIN: Normal hydration no rash or ulceration CNS: Cranial nerves 2-12 grossly intact no focal lateralizing neurologic deficit   Labs on Admission:  Basic Metabolic Panel:  Lab 07/10/12 1610 07/08/12 1528  NA 134* --  K 3.8 --  CL 95* --    CO2 33* --  GLUCOSE 96 --  BUN 6 --  CREATININE 0.65 0.64  CALCIUM 9.6 --  MG -- --  PHOS -- --   Liver Function Tests:  Lab 07/10/12 1608  AST 11  ALT 6  ALKPHOS 119*  BILITOT 0.2*  PROT 7.0  ALBUMIN 3.2*   No results found for this basename: LIPASE:5,AMYLASE:5 in the last 168 hours No results found for this basename: AMMONIA:5 in the last 168 hours CBC:  Lab 07/10/12 1608  WBC 5.7  NEUTROABS 4.1  HGB 10.6*  HCT 34.3*  MCV 82.9  PLT 228   Cardiac Enzymes: No results found for this basename: CKTOTAL:5,CKMB:5,CKMBINDEX:5,TROPONINI:5 in the last 168 hours BNP: No components found with this basename: POCBNP:5 CBG: No results found for this basename: GLUCAP:5 in the last 168 hours  Results for orders placed during the hospital encounter of 07/10/12 (from the past 48 hour(s))  CBC WITH DIFFERENTIAL     Status: Abnormal   Collection Time   07/10/12  4:08 PM      Component Value Range Comment   WBC 5.7  4.0 - 10.5 K/uL    RBC 4.14  3.87 - 5.11 MIL/uL    Hemoglobin 10.6 (*) 12.0 - 15.0 g/dL    HCT 96.0 (*) 45.4 - 46.0 %    MCV 82.9  78.0 - 100.0 fL    MCH 25.6 (*) 26.0 - 34.0 pg    MCHC 30.9  30.0 - 36.0 g/dL    RDW 09.8 (*) 11.9 - 15.5 %    Platelets 228  150 - 400 K/uL    Neutrophils Relative 72  43 - 77 %    Neutro Abs 4.1  1.7 - 7.7 K/uL    Lymphocytes Relative 19  12 - 46 %    Lymphs Abs 1.1  0.7 - 4.0 K/uL    Monocytes Relative 7  3 - 12 %    Monocytes Absolute 0.4  0.1 - 1.0 K/uL    Eosinophils Relative 2  0 - 5 %    Eosinophils Absolute 0.1  0.0 - 0.7 K/uL    Basophils Relative 0  0 - 1 %    Basophils Absolute 0.0  0.0 - 0.1 K/uL   COMPREHENSIVE METABOLIC PANEL     Status: Abnormal   Collection Time   07/10/12  4:08 PM      Component Value Range Comment   Sodium 134 (*) 135 - 145 mEq/L    Potassium 3.8  3.5 - 5.1 mEq/L    Chloride  95 (*) 96 - 112 mEq/L    CO2 33 (*) 19 - 32 mEq/L    Glucose, Bld 96  70 - 99 mg/dL    BUN 6  6 - 23 mg/dL     Creatinine, Ser 1.47  0.50 - 1.10 mg/dL    Calcium 9.6  8.4 - 82.9 mg/dL    Total Protein 7.0  6.0 - 8.3 g/dL    Albumin 3.2 (*) 3.5 - 5.2 g/dL    AST 11  0 - 37 U/L    ALT 6  0 - 35 U/L    Alkaline Phosphatase 119 (*) 39 - 117 U/L    Total Bilirubin 0.2 (*) 0.3 - 1.2 mg/dL    GFR calc non Af Amer 90 (*) >90 mL/min    GFR calc Af Amer >90  >90 mL/min   CULTURE, BLOOD (ROUTINE X 2)     Status: Normal (Preliminary result)   Collection Time   07/10/12  4:09 PM      Component Value Range Comment   Specimen Description BLOOD RIGHT ARM      Special Requests BOTTLES DRAWN AEROBIC AND ANAEROBIC 6CC      Culture PENDING      Report Status PENDING     CULTURE, BLOOD (ROUTINE X 2)     Status: Normal (Preliminary result)   Collection Time   07/10/12  4:20 PM      Component Value Range Comment   Specimen Description BLOOD LEFT ARM      Special Requests BOTTLES DRAWN AEROBIC ONLY 8CC      Culture PENDING      Report Status PENDING        Radiological Exams on Admission: Ct Abdomen Pelvis W Contrast  07/10/2012  *RADIOLOGY REPORT*  Clinical Data: Cecal polyps and sigmoid inflammatory changes on recent colonoscopy.  History of diabetes and lung cancer.  CT ABDOMEN AND PELVIS WITH CONTRAST  Technique:  Multidetector CT imaging of the abdomen and pelvis was performed following the standard protocol during bolus administration of intravenous contrast.  Contrast: OMNIPAQUE IOHEXOL 300 MG/ML  SOLN  Comparison: Chest CT 04/22/2012.  Findings: Irregular subpleural scarring in both lung bases appears stable.  Of note, the spiculated mass demonstrated within the right lower lobe on the prior chest CT is not imaged but still appears to be present based on the scout film.  There is an irregular perinephric fluid collection superiorly on the left with peripheral enhancing fluid collections distorting the upper pole of the left kidney.  Components of this fluid collection measure up to 3.0 cm on image 21 and 3.2  cm on image 22.  There is no significant inferior extension in the perinephric space.  No intra renal abnormality, hydronephrosis or delayed contrast excretion is seen.  The right kidney appears normal without cortical abnormality or perinephric fluid collection.  There are scattered calcified hepatic granulomas.  The liver otherwise appears normal.  There are small gallstones within the gallbladder lumen.  There is no gallbladder wall thickening or biliary dilatation.  The pancreatic tail abuts the left pararenal fluid collections and there appears to be a small amount of fluid adjacent to the pancreatic tail.  The pancreatic body and head appear normal.  The spleen and adrenal glands appear normal.  The splenic and portal veins are patent.  The stomach is incompletely distended.  There is colon wall thickening at the splenic flexure with minimal adjacent fluid in the left pericolic gutter.  The  remainder of the colon appears unremarkable. No inflammatory changes are seen associated with the sigmoid colon.  There is no pelvic fluid collection or ascites. There is no extravasated enteric contrast or free intraperitoneal air.  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  There is aortoiliac atherosclerosis.  No pelvic mass is seen status post hysterectomy.  Facet degenerative changes are present in the lower lumbar spine associated with a mild convex left scoliosis.  IMPRESSION:  1.  Left upper quadrant inflammatory process with irregularly enhancing left perinephric fluid collections and colonic wall thickening at the splenic flexure. The findings are concerning for infection, potentially related to diverticulitis or recent colonoscopy.  Although there is a small amount of fluid adjacent to the pancreatic tail, pancreatitis is considered unlikely. 2.  No other intra-abdominal fluid collections identified. No extravasated enteric contrast or evidence of bowel obstruction. 3.   Cholelithiasis. 4.  Previously noted right lower lobe spiculated lung mass is not imaged on the axial images but still appears to be present based on the scout image. This remains concerning for lung cancer.  These results were called by telephone on 07/10/2012 at 1145 hours to Dr. Jena Gauss, covering for Dr. Karilyn Cota, who verbally acknowledged these results.  Original Report Authenticated By: Gerrianne Scale, M.D.      Assessment/Plan Present on Admission:  .Perinephric/pericolonic abscess; possibly due to perforated diverticula; does not appear to be a primary renal problem  Will treat with intravenous Zosyn and consult surgery for opinion; we'll also consult the GI service for advice   .HTN (hypertension)  Continue home medication  .COPD (chronic obstructive pulmonary disease)  Continue twice daily albuterol and Spiriva; but add more when necessary albuterol  .Anemia, chronic  .Tobacco abuse  Counsel on nicotine cessation   .Hypothyroidism  Continue Synthroid   Other plans as per orders.  Code Status: FULL CODE    Tenzin Edelman Nocturnist [facility] Pager [phone]   07/10/2012, 8:29 PM

## 2012-07-10 NOTE — ED Notes (Signed)
Pt states that she came for an x-ray this morning and received a call from her MD office to come back to the ED for admission. Pt alert and oriented x 3. Skin warm and dry. Color pink. Breath sounds equal bilaterally with bilateral inspiratory and expiratory wheezing. Pt has congested nonproductive cough. C/o headache.

## 2012-07-10 NOTE — Progress Notes (Signed)
ANTIBIOTIC CONSULT NOTE - INITIAL  Pharmacy Consult for Zosyn Indication: perinephric abscess  Allergies  Allergen Reactions  . Sulfa Antibiotics Other (See Comments)    HANDS,FEET AND MOUTH BLISTERING    Patient Measurements: Height: 5\' 7"  (170.2 cm) Weight: 137 lb 9.1 oz (62.4 kg) IBW/kg (Calculated) : 61.6    Vital Signs: Temp: 98.5 F (36.9 C) (08/09 1910) Temp src: Oral (08/09 1910) BP: 157/90 mmHg (08/09 1910) Pulse Rate: 98  (08/09 2043) Intake/Output from previous day:   Intake/Output from this shift:    Labs:  Basename 07/10/12 1608 07/08/12 1528  WBC 5.7 --  HGB 10.6* --  PLT 228 --  LABCREA -- --  CREATININE 0.65 0.64   Estimated Creatinine Clearance: 66.4 ml/min (by C-G formula based on Cr of 0.65). No results found for this basename: VANCOTROUGH:2,VANCOPEAK:2,VANCORANDOM:2,GENTTROUGH:2,GENTPEAK:2,GENTRANDOM:2,TOBRATROUGH:2,TOBRAPEAK:2,TOBRARND:2,AMIKACINPEAK:2,AMIKACINTROU:2,AMIKACIN:2, in the last 72 hours   Microbiology: Recent Results (from the past 720 hour(s))  CULTURE, BLOOD (ROUTINE X 2)     Status: Normal (Preliminary result)   Collection Time   07/10/12  4:09 PM      Component Value Range Status Comment   Specimen Description BLOOD RIGHT ARM   Final    Special Requests BOTTLES DRAWN AEROBIC AND ANAEROBIC 6CC   Final    Culture PENDING   Incomplete    Report Status PENDING   Incomplete   CULTURE, BLOOD (ROUTINE X 2)     Status: Normal (Preliminary result)   Collection Time   07/10/12  4:20 PM      Component Value Range Status Comment   Specimen Description BLOOD LEFT ARM   Final    Special Requests BOTTLES DRAWN AEROBIC ONLY 8CC   Final    Culture PENDING   Incomplete    Report Status PENDING   Incomplete     Medical History: Past Medical History  Diagnosis Date  . Aneurysm     brain  . GERD (gastroesophageal reflux disease)   . Hypertension   . Anxiety   . Oxygen dependent     requires oxygen for at least 16 hours per day,   .  Hypothyroidism   . COPD (chronic obstructive pulmonary disease)     on home oxygen  . Anemia     iron deficiency anemia  . Gastric erosions     causing anemia  . Chronic respiratory failure   . Anemia   . Gastric ulcer   . Borderline diabetes   . Cancer   . Lung cancer     Medications:  Scheduled:    . acetaminophen  650 mg Oral Once  . albuterol  2.5 mg Nebulization BID  . albuterol  5 mg Nebulization Once  . amitriptyline  50 mg Oral QHS  . arformoterol  15 mcg Nebulization BID  . docusate sodium  200 mg Oral QPM  . fluticasone  2 spray Each Nare BH-q7a  . guaiFENesin  1,200 mg Oral BID  . ipratropium  0.5 mg Nebulization Once  . levothyroxine  88 mcg Oral QAC breakfast  . lisinopril  10 mg Oral BH-q7a  . pantoprazole  80 mg Oral BID AC  . piperacillin-tazobactam  3.375 g Intravenous Once  . piperacillin-tazobactam (ZOSYN)  IV  3.375 g Intravenous Q8H  . sodium chloride  700 mL Intravenous Once  . theophylline  300 mg Oral BID  . tiotropium  18 mcg Inhalation Daily  . venlafaxine XR  300 mg Oral Q breakfast   Assessment: Ok for protocol Zosyn 3.375 GM  IV given in ER CrCl > 20 ml/min  Goal of Therapy:  Eradicate infection  Plan:  Zosyn 3.375 GM IV every 8 hours Labs per protocol Monitor renal function  Raquel James, Matricia Begnaud Bennett 07/10/2012,9:31 PM

## 2012-07-10 NOTE — ED Notes (Signed)
Pt showing NSR on cardiac monitor.

## 2012-07-10 NOTE — ED Provider Notes (Signed)
History   This chart was scribed for Ward Givens, MD by Melba Coon. The patient was seen in room APA07/APA07 and the patient's care was started at 3:21PM.    CSN: [id number]  Arrival date & time 07/10/12  1347   First MD Initiated Contact with Patient 07/10/12 1506      Chief Complaint  Patient presents with  . Abdominal Pain    (Consider location/radiation/quality/duration/timing/severity/associated sxs/prior treatment) The history is provided by the patient. No language interpreter was used.   Tracey Martinez is a 66 y.o. female who presents to the Emergency Department complaining of intermittent, moderate to severe, dull and achey left sided abdominal pain a month ago. When the pain is present, it is there for a couple minutes up to 15  minutes then goes away. Nothing alleviates the pain. Coughing aggravates the pain. Pt is on 2L of supplemental O2. Questionable chills and baseline trouble swallowing present. No HA, fever, neck pain, sore throat, rash, back pain, CP, SOB, abd pain, n/v/d, dysuria, or extremity pain, edema, weakness, numbness, or tingling. Allergic to sulfa abx. No other pertinent medical symptoms.  Pt had a regularly scheduled EDG/colonoscopy about 2 weeks ago. States she had an ulcer. She relates she had a CT scan of her abdomen/pelvic today and was called and told to come to the ED.   PCP: Ninfa Linden in [facility] GI: Dr. Karilyn Cota  Past Medical History  Diagnosis Date  . Aneurysm     brain  . GERD (gastroesophageal reflux disease)   . Hypertension   . Anxiety   . Oxygen dependent     requires oxygen for at least 16 hours per day,   . Hypothyroidism   . COPD (chronic obstructive pulmonary disease)     on home oxygen  . Anemia     iron deficiency anemia  . Gastric erosions     causing anemia  . Chronic respiratory failure   . Anemia   . Gastric ulcer   . Borderline diabetes   . Cancer   . Lung cancer     Past Surgical History   Procedure Date  . Lung removal, partial   . Abdominal hysterectomy   . Esophagogastroduodenoscopy 05/11/2012    Procedure: ESOPHAGOGASTRODUODENOSCOPY (EGD);  Surgeon: Malissa Hippo, MD;  Location: [facility];  Service: Endoscopy;  Laterality: N/A;  . Cataract extraction, bilateral     History reviewed. No pertinent family history.  History  Substance Use Topics  . Smoking status: Current Some Day Smoker -- 0.2 packs/day for 55 years  . Smokeless tobacco: Not on file  . Alcohol Use: No  Lives at home Lives with spouse Home oxygen 2 lpm [place]   OB History    Grav Para Term Preterm Abortions TAB SAB Ect Mult Living                  Review of Systems 10 Systems reviewed and all are negative for acute change except as noted in the HPI.   Allergies  Sulfa antibiotics  Home Medications   Current Outpatient Rx  Name Route Sig Dispense Refill  . ACETAMINOPHEN 500 MG PO TABS Oral Take 1,000 mg by mouth every 4 (four) hours as needed. For pain    . ALBUTEROL SULFATE HFA 108 (90 BASE) MCG/ACT IN AERS Inhalation Inhale 2 puffs into the lungs every 4 (four) hours as needed for wheezing. 1 each 0  . ALBUTEROL SULFATE (2.5 MG/3ML) 0.083% IN NEBU Nebulization  Take 2.5 mg by nebulization 2 (two) times daily.     Marland Kitchen AMITRIPTYLINE HCL 25 MG PO TABS Oral Take 50 mg by mouth at bedtime.    . ARFORMOTEROL TARTRATE 15 MCG/2ML IN NEBU Nebulization Take 15 mcg by nebulization 2 (two) times daily.    . BUSPIRONE HCL 10 MG PO TABS Oral Take 10 mg by mouth 3 (three) times daily as needed. For anxiety    . CALCIUM-MAGNESIUM-ZINC PO Oral Take 1 tablet by mouth every morning.     Marland Kitchen VITAMIN D 1000 UNITS PO TABS Oral Take 1,000 Units by mouth every morning.     Marland Kitchen DOCUSATE SODIUM 100 MG PO CAPS Oral Take 200 mg by mouth every morning.     Marland Kitchen FLUTICASONE PROPIONATE 50 MCG/ACT NA SUSP Nasal Place 2 sprays into the nose every morning.    . FUROSEMIDE 20 MG PO TABS Oral Take 20 mg by mouth 2 (two) times  daily.    Marland Kitchen LEVOTHYROXINE SODIUM 88 MCG PO TABS Oral Take 88 mcg by mouth daily before breakfast.     . LISINOPRIL 20 MG PO TABS Oral Take 10 mg by mouth every morning.    Marland Kitchen OMEPRAZOLE 40 MG PO CPDR Oral Take 1 capsule (40 mg total) by mouth 2 (two) times daily. 60 capsule 2  . POTASSIUM GLUCONATE 595 MG PO CAPS Oral Take 1 capsule by mouth every morning.    Bernadette Hoit SODIUM 8.6-50 MG PO TABS Oral Take 2 tablets by mouth daily as needed. Constipation    . THEOPHYLLINE ER 300 MG PO CP24 Oral Take 300 mg by mouth 2 (two) times daily.    Marland Kitchen TIOTROPIUM BROMIDE MONOHYDRATE 18 MCG IN CAPS Inhalation Place 18 mcg into inhaler and inhale daily.      BP 131/72  Pulse 97  Temp 98.8 F (37.1 C) (Oral)  Resp 18  Ht 5\' 7"  (1.702 m)  Wt 135 lb (61.236 kg)  BMI 21.14 kg/m2  SpO2 98%  Vital signs normal    Physical Exam  Nursing note and vitals reviewed. Constitutional: She is oriented to person, place, and time. She appears well-developed and well-nourished.  Non-toxic appearance. She does not appear ill. No distress.  HENT:  Head: Normocephalic and atraumatic.  Right Ear: External ear normal.  Left Ear: External ear normal.  Nose: Nose normal. No mucosal edema or rhinorrhea.  Mouth/Throat: Oropharynx is clear and moist and mucous membranes are normal. No dental abscesses or uvula swelling.  Eyes: Conjunctivae and EOM are normal. Pupils are equal, round, and reactive to light.  Neck: Normal range of motion and full passive range of motion without pain. Neck supple. No tracheal deviation present.  Cardiovascular: Normal rate, regular rhythm and normal heart sounds.  Exam reveals no gallop and no friction rub.   No murmur heard. Pulmonary/Chest: Effort normal. No respiratory distress. She has wheezes (course, rattling whezzing at the bases). She has no rhonchi. She has no rales. She exhibits no tenderness and no crepitus.       Course breath sounds  Abdominal: Soft. Normal appearance  and bowel sounds are normal. She exhibits no distension. There is tenderness (left lateral abdomen). There is no rebound and no guarding.  Musculoskeletal: Normal range of motion. She exhibits no edema and no tenderness.       Moves all extremities well.   Neurological: She is alert and oriented to person, place, and time. She has normal strength. No cranial nerve deficit.  Skin: Skin is  warm, dry and intact. No rash noted. No erythema. No pallor.  Psychiatric: She has a normal mood and affect. Her speech is normal and behavior is normal. Her mood appears not anxious.    ED Course  Procedures (including critical care time)   Medications  0.9 %  sodium chloride infusion (  Intravenous New Bag/Given 07/10/12 1629)  sodium chloride 0.9 % bolus 700 mL (not administered)  venlafaxine XR (EFFEXOR-XR) 150 MG 24 hr capsule (not administered)  piperacillin-tazobactam (ZOSYN) IVPB 3.375 g (not administered)  albuterol (PROVENTIL) (5 MG/ML) 0.5% nebulizer solution 5 mg (5 mg Nebulization Given 07/10/12 1650)  ipratropium (ATROVENT) nebulizer solution 0.5 mg (0.5 mg Nebulization Given 07/10/12 1650)  acetaminophen (TYLENOL) tablet 650 mg (650 mg Oral Given 07/10/12 1647)     DIAGNOSTIC STUDIES: .    COORDINATION OF CARE:  3:35PM - PMHx reviewed; pt had the colonosocpy 9 days ago; colonoscopy showed possible cancerous activity around the sigmoid colon; but reason for abd CT is unknown possibly to evaluate the sigmoid more fully. Abd CT was performed today; results showed a 3 cm left perinephric fluid collection, possibly an abscess. IV fluids, blood w/u, and UA will be ordered for the pt.   CT scan reviewed with Dr Kearney Hard at 15:38, states the area would be hard to access by percutaneous drainage, recommends conservative treatment initially.   Pt given nebulizer for her wheezing. States she hasn't had any inhaler/nebulizers today.   4:07PM - Pt will need to be admitted; pt was not initially agreeable  with decision - pt thought she was only here for one shot of abx; pt is willing to be admitted.  4:40PM - Pt state to nurse that she has a HA; Tylenol will be ordered for the pt  5:35PM - recheck; pt informed her blood work was back, waiting to hear from admitting doctor. Has not provided urine sample yet.  Given bolus of fluid to facilitate urine output.   17:50 Dr Sherrie Mustache, admit to team 1, tele, go ahead and start Zosyn, don't wait for UA to be obtained.   Results for orders placed during the hospital encounter of 07/10/12  CBC WITH DIFFERENTIAL      Component Value Range   WBC 5.7  4.0 - 10.5 K/uL   RBC 4.14  3.87 - 5.11 MIL/uL   Hemoglobin 10.6 (*) 12.0 - 15.0 g/dL   HCT 21.3 (*) 08.6 - 57.8 %   MCV 82.9  78.0 - 100.0 fL   MCH 25.6 (*) 26.0 - 34.0 pg   MCHC 30.9  30.0 - 36.0 g/dL   RDW 46.9 (*) 62.9 - 52.8 %   Platelets 228  150 - 400 K/uL   Neutrophils Relative 72  43 - 77 %   Neutro Abs 4.1  1.7 - 7.7 K/uL   Lymphocytes Relative 19  12 - 46 %   Lymphs Abs 1.1  0.7 - 4.0 K/uL   Monocytes Relative 7  3 - 12 %   Monocytes Absolute 0.4  0.1 - 1.0 K/uL   Eosinophils Relative 2  0 - 5 %   Eosinophils Absolute 0.1  0.0 - 0.7 K/uL   Basophils Relative 0  0 - 1 %   Basophils Absolute 0.0  0.0 - 0.1 K/uL  COMPREHENSIVE METABOLIC PANEL      Component Value Range   Sodium 134 (*) 135 - 145 mEq/L   Potassium 3.8  3.5 - 5.1 mEq/L   Chloride 95 (*) 96 - 112  mEq/L   CO2 33 (*) 19 - 32 mEq/L   Glucose, Bld 96  70 - 99 mg/dL   BUN 6  6 - 23 mg/dL   Creatinine, Ser 3.08  0.50 - 1.10 mg/dL   Calcium 9.6  8.4 - 65.7 mg/dL   Total Protein 7.0  6.0 - 8.3 g/dL   Albumin 3.2 (*) 3.5 - 5.2 g/dL   AST 11  0 - 37 U/L   ALT 6  0 - 35 U/L   Alkaline Phosphatase 119 (*) 39 - 117 U/L   Total Bilirubin 0.2 (*) 0.3 - 1.2 mg/dL   GFR calc non Af Amer 90 (*) >90 mL/min   GFR calc Af Amer >90  >90 mL/min  CULTURE, BLOOD (ROUTINE X 2)      Component Value Range   Specimen Description BLOOD RIGHT ARM      Special Requests BOTTLES DRAWN AEROBIC AND ANAEROBIC 6CC     Culture PENDING     Report Status PENDING    CULTURE, BLOOD (ROUTINE X 2)      Component Value Range   Specimen Description BLOOD LEFT ARM     Special Requests BOTTLES DRAWN AEROBIC ONLY 8CC     Culture PENDING     Report Status PENDING      Laboratory interpretation all normal except hyponatremia, low chloride, anemia     Ct Abdomen Pelvis W Contrast  07/10/2012  *RADIOLOGY REPORT*  Clinical Data: Cecal polyps and sigmoid inflammatory changes on recent colonoscopy.  History of diabetes and lung cancer.  CT ABDOMEN AND PELVIS WITH CONTRAST  Technique:  Multidetector CT imaging of the abdomen and pelvis was performed following the standard protocol during bolus administration of intravenous contrast.  Contrast: OMNIPAQUE IOHEXOL 300 MG/ML  SOLN  Comparison: Chest CT 04/22/2012.  Findings: Irregular subpleural scarring in both lung bases appears stable.  Of note, the spiculated mass demonstrated within the right lower lobe on the prior chest CT is not imaged but still appears to be present based on the scout film.  There is an irregular perinephric fluid collection superiorly on the left with peripheral enhancing fluid collections distorting the upper pole of the left kidney.  Components of this fluid collection measure up to 3.0 cm on image 21 and 3.2 cm on image 22.  There is no significant inferior extension in the perinephric space.  No intra renal abnormality, hydronephrosis or delayed contrast excretion is seen.  The right kidney appears normal without cortical abnormality or perinephric fluid collection.  There are scattered calcified hepatic granulomas.  The liver otherwise appears normal.  There are small gallstones within the gallbladder lumen.  There is no gallbladder wall thickening or biliary dilatation.  The pancreatic tail abuts the left pararenal fluid collections and there appears to be a small amount of fluid  adjacent to the pancreatic tail.  The pancreatic body and head appear normal.  The spleen and adrenal glands appear normal.  The splenic and portal veins are patent.  The stomach is incompletely distended.  There is colon wall thickening at the splenic flexure with minimal adjacent fluid in the left pericolic gutter.  The remainder of the colon appears unremarkable. No inflammatory changes are seen associated with the sigmoid colon.  There is no pelvic fluid collection or ascites. There is no extravasated enteric contrast or free intraperitoneal air.  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  There is aortoiliac atherosclerosis.  No pelvic mass is  seen status post hysterectomy.  Facet degenerative changes are present in the lower lumbar spine associated with a mild convex left scoliosis.  IMPRESSION:  1.  Left upper quadrant inflammatory process with irregularly enhancing left perinephric fluid collections and colonic wall thickening at the splenic flexure. The findings are concerning for infection, potentially related to diverticulitis or recent colonoscopy.  Although there is a small amount of fluid adjacent to the pancreatic tail, pancreatitis is considered unlikely. 2.  No other intra-abdominal fluid collections identified. No extravasated enteric contrast or evidence of bowel obstruction. 3.  Cholelithiasis. 4.  Previously noted right lower lobe spiculated lung mass is not imaged on the axial images but still appears to be present based on the scout image. This remains concerning for lung cancer.  These results were called by telephone on 07/10/2012 at 1145 hours to Dr. Jena Gauss, covering for Dr. Karilyn Cota, who verbally acknowledged these results.  Original Report Authenticated By: Gerrianne Scale, M.D.     1. Perinephric abscess   2. Anemia     Plan admission  Devoria Albe, MD, FACEP   MDM    I personally performed the services described in this documentation, which  was scribed in my presence. The recorded information has been reviewed and considered.         Ward Givens, MD 07/10/12 1754

## 2012-07-10 NOTE — Telephone Encounter (Signed)
Discussed w/ Dr Jena Gauss & pt.  Pt should present to ED for possible admission/IV antibiotics for perinephric abscess noted on CT. Pt agrees w/ plan. Discussed w/ triage RN.

## 2012-07-10 NOTE — ED Notes (Addendum)
Received call from Dr Luvenia Starch office that this pt was to be seen for abscess of kidney, Had ct scan done here today.   Pt is on 02 at 2 L

## 2012-07-11 DIAGNOSIS — R933 Abnormal findings on diagnostic imaging of other parts of digestive tract: Secondary | ICD-10-CM

## 2012-07-11 DIAGNOSIS — K651 Peritoneal abscess: Principal | ICD-10-CM

## 2012-07-11 DIAGNOSIS — Z85118 Personal history of other malignant neoplasm of bronchus and lung: Secondary | ICD-10-CM

## 2012-07-11 DIAGNOSIS — D649 Anemia, unspecified: Secondary | ICD-10-CM

## 2012-07-11 LAB — URINALYSIS, ROUTINE W REFLEX MICROSCOPIC
Bilirubin Urine: NEGATIVE
Hgb urine dipstick: NEGATIVE
Protein, ur: NEGATIVE mg/dL
Urobilinogen, UA: 0.2 mg/dL (ref 0.0–1.0)

## 2012-07-11 LAB — COMPREHENSIVE METABOLIC PANEL
AST: 9 U/L (ref 0–37)
Albumin: 2.9 g/dL — ABNORMAL LOW (ref 3.5–5.2)
Calcium: 9.4 mg/dL (ref 8.4–10.5)
Creatinine, Ser: 0.59 mg/dL (ref 0.50–1.10)
Total Protein: 6.4 g/dL (ref 6.0–8.3)

## 2012-07-11 LAB — CBC
MCH: 25.6 pg — ABNORMAL LOW (ref 26.0–34.0)
MCHC: 30.6 g/dL (ref 30.0–36.0)
MCV: 83.5 fL (ref 78.0–100.0)
Platelets: 194 10*3/uL (ref 150–400)
RDW: 17.9 % — ABNORMAL HIGH (ref 11.5–15.5)
WBC: 4.8 10*3/uL (ref 4.0–10.5)

## 2012-07-11 MED ORDER — SODIUM CHLORIDE 0.9 % IN NEBU
INHALATION_SOLUTION | RESPIRATORY_TRACT | Status: AC
  Filled 2012-07-11: qty 3

## 2012-07-11 MED ORDER — SODIUM CHLORIDE 0.9 % IJ SOLN
INTRAMUSCULAR | Status: AC
  Administered 2012-07-11: 14:00:00
  Filled 2012-07-11: qty 3

## 2012-07-11 MED ORDER — SENNOSIDES-DOCUSATE SODIUM 8.6-50 MG PO TABS: 2.0000 | ORAL_TABLET | Freq: Every evening | ORAL | Status: DC | PRN

## 2012-07-11 MED ORDER — PIPERACILLIN-TAZOBACTAM 3.375 G IVPB
INTRAVENOUS | Status: AC
  Filled 2012-07-11: qty 50

## 2012-07-11 NOTE — Consult Note (Addendum)
Referring Provider: No ref. provider found Primary Care Physician:  Ninfa Linden, NP Primary Gastroenterologist:  Dr. Karilyn Cota  Reason for Consultation: Abnormal CT    HPI:  66 year old lady admitted yesterday after being found to have an abnormal left kidney/left Colon on CT concerning for perinephric abscess versus diverticulitis.  This lady was recently noted to the hospital with profound anemia nausea and vomiting/melena. She was found to have severe inflammation in her esophagus felt to be due to trauma of nausea and vomiting.. She came back as an outpatient and had both an EGD and a colonoscopy (the latter predominantly for screening and IDA as well). Esophageal lesion had healed nicely. Patient had a couple small adenomas and left-sided diverticulosis;  Abnormal segment of proximal sigmoid colon  concerning for neoplastic process. However, biopsies were negative for neoplasia. Dr. Karilyn Cota subsequently ordered a CT which was done yesterday. Salient findings as outlined above.  This lady denies having any fever chills, urinary tract symptoms of dysuria, urinary frequency hematuria or flank pain. She states for several months she's had some very vague lateral left upper quadrant abdominal pain which comes and goes and lasts 10-15 minutes. This is the setting of chronic constipation. She denies any episode of diarrhea or hematochezia. She states the pain has not ever been bad enough that she would think about seeking out medical evaluation.  She is remained afebrile during this hospitalization. She's been seen by Dr. Lovell Sheehan. Urinalysis and blood/urine cultures negative so far. On empiric antibiotics. She is on basically a regular diet and is tolerating it well.  Size of perinephric fluid collection  no more than 3 cm-not amenable to drainage at this time.   Past Medical History  Diagnosis Date  . Aneurysm     brain  . GERD (gastroesophageal reflux disease)   . Hypertension   . Anxiety     . Oxygen dependent     requires oxygen for at least 16 hours per day,   . Hypothyroidism   . COPD (chronic obstructive pulmonary disease)     on home oxygen  . Anemia     iron deficiency anemia  . Gastric erosions     causing anemia  . Chronic respiratory failure   . Anemia   . Gastric ulcer   . Borderline diabetes   . Cancer   . Lung cancer     Past Surgical History  Procedure Date  . Lung removal, partial   . Abdominal hysterectomy   . Esophagogastroduodenoscopy 05/11/2012    Procedure: ESOPHAGOGASTRODUODENOSCOPY (EGD);  Surgeon: Malissa Hippo, MD;  Location: [facility];  Service: Endoscopy;  Laterality: N/A;  . Cataract extraction, bilateral     Prior to Admission medications   Medication Sig Start Date End Date Taking? Authorizing Provider  acetaminophen (TYLENOL) 500 MG tablet Take 1,000 mg by mouth every 4 (four) hours as needed. For pain   Yes Historical Provider, MD  albuterol (PROVENTIL) (2.5 MG/3ML) 0.083% nebulizer solution Take 2.5 mg by nebulization 2 (two) times daily.    Yes Historical Provider, MD  amitriptyline (ELAVIL) 25 MG tablet Take 50 mg by mouth at bedtime.   Yes Historical Provider, MD  arformoterol (BROVANA) 15 MCG/2ML NEBU Take 15 mcg by nebulization 2 (two) times daily.   Yes Historical Provider, MD  busPIRone (BUSPAR) 10 MG tablet Take 10 mg by mouth 3 (three) times daily as needed. For anxiety   Yes Historical Provider, MD  CALCIUM-MAGNESIUM-ZINC PO Take 1 tablet by mouth every morning.  Yes Historical Provider, MD  cholecalciferol (VITAMIN D) 1000 UNITS tablet Take 1,000 Units by mouth every morning.    Yes Historical Provider, MD  docusate sodium (COLACE) 100 MG capsule Take 200 mg by mouth every evening.    Yes Historical Provider, MD  fluticasone (FLONASE) 50 MCG/ACT nasal spray Place 2 sprays into the nose every morning.   Yes Historical Provider, MD  furosemide (LASIX) 20 MG tablet Take 20 mg by mouth every morning.    Yes Historical  Provider, MD  levothyroxine (SYNTHROID, LEVOTHROID) 88 MCG tablet Take 88 mcg by mouth daily before breakfast.    Yes Historical Provider, MD  lisinopril (PRINIVIL,ZESTRIL) 20 MG tablet Take 10 mg by mouth every morning.   Yes Historical Provider, MD  omeprazole (PRILOSEC) 40 MG capsule Take 1 capsule (40 mg total) by mouth 2 (two) times daily. 05/12/12  Yes Erick Blinks, MD  Potassium Gluconate 595 MG CAPS Take 1 capsule by mouth every morning.   Yes Historical Provider, MD  theophylline (THEO-24) 300 MG 24 hr capsule Take 300 mg by mouth 2 (two) times daily.   Yes Historical Provider, MD  tiotropium (SPIRIVA) 18 MCG inhalation capsule Place 18 mcg into inhaler and inhale daily.   Yes Historical Provider, MD  venlafaxine XR (EFFEXOR-XR) 150 MG 24 hr capsule Take 300 mg by mouth daily with breakfast.   Yes Historical Provider, MD  senna-docusate (SENNA PLUS) 8.6-50 MG per tablet Take 2 tablets by mouth at bedtime as needed. Constipation    Historical Provider, MD    Current Facility-Administered Medications  Medication Dose Route Frequency Provider Last Rate Last Dose  . 0.9 % NaCl with KCl 20 mEq/ L  infusion   Intravenous Continuous Christiane Ha, MD 50 mL/hr at 07/10/12 2209 50 mL/hr at 07/10/12 2209  . acetaminophen (TYLENOL) tablet 1,000 mg  1,000 mg Oral Q4H PRN Vania Rea, MD   1,000 mg at 07/11/12 [phone]  . acetaminophen (TYLENOL) tablet 650 mg  650 mg Oral Once Ward Givens, MD   650 mg at 07/10/12 1647  . albuterol (PROVENTIL) (5 MG/ML) 0.5% nebulizer solution 2.5 mg  2.5 mg Nebulization BID Vania Rea, MD   2.5 mg at 07/11/12 0721  . albuterol (PROVENTIL) (5 MG/ML) 0.5% nebulizer solution 2.5 mg  2.5 mg Nebulization Q6H PRN Vania Rea, MD      . albuterol (PROVENTIL) (5 MG/ML) 0.5% nebulizer solution 5 mg  5 mg Nebulization Once Ward Givens, MD   5 mg at 07/10/12 1650  . amitriptyline (ELAVIL) tablet 50 mg  50 mg Oral QHS Vania Rea, MD   50 mg at 07/10/12  2208  . arformoterol (BROVANA) nebulizer solution 15 mcg  15 mcg Nebulization BID Vania Rea, MD      . bisacodyl (DULCOLAX) EC tablet 5 mg  5 mg Oral Daily PRN Vania Rea, MD      . busPIRone (BUSPAR) tablet 10 mg  10 mg Oral TID PRN Vania Rea, MD      . docusate sodium (COLACE) capsule 200 mg  200 mg Oral QPM Vania Rea, MD   200 mg at 07/10/12 2208  . fluticasone (FLONASE) 50 MCG/ACT nasal spray 2 spray  2 spray Each Nare [id number] Vania Rea, MD   2 spray at 07/11/12 0700  . guaiFENesin (MUCINEX) 12 hr tablet 1,200 mg  1,200 mg Oral BID Vania Rea, MD   1,200 mg at 07/10/12 2208  . HYDROmorphone (DILAUDID) injection 0.5 mg  0.5 mg Intravenous  Q2H PRN Vania Rea, MD      . ipratropium (ATROVENT) nebulizer solution 0.5 mg  0.5 mg Nebulization Once Ward Givens, MD   0.5 mg at 07/10/12 1650  . levothyroxine (SYNTHROID, LEVOTHROID) tablet 88 mcg  88 mcg Oral QAC breakfast Vania Rea, MD   88 mcg at 07/11/12 0846  . lisinopril (PRINIVIL,ZESTRIL) tablet 10 mg  10 mg Oral Chauncey Cruel, MD   10 mg at 07/11/12 [phone]  . ondansetron (ZOFRAN) injection 4 mg  4 mg Intravenous Q6H PRN Vania Rea, MD      . oxyCODONE (Oxy IR/ROXICODONE) immediate release tablet 5 mg  5 mg Oral Q4H PRN Vania Rea, MD      . pantoprazole (PROTONIX) EC tablet 80 mg  80 mg Oral BID AC Vania Rea, MD   80 mg at 07/11/12 0846  . piperacillin-tazobactam (ZOSYN) IVPB 3.375 g  3.375 g Intravenous Once Ward Givens, MD 12.5 mL/hr at 07/10/12 1800 3.375 g at 07/10/12 1800  . piperacillin-tazobactam (ZOSYN) IVPB 3.375 g  3.375 g Intravenous Q8H Vania Rea, MD   3.375 g at 07/11/12 0148  . senna-docusate (Senokot-S) tablet 2 tablet  2 tablet Oral QHS PRN Christiane Ha, MD      . sodium chloride 0.9 % bolus 700 mL  700 mL Intravenous Once Ward Givens, MD      . sodium chloride 0.9 % nebulizer solution           . sodium phosphate (FLEET) 7-19 GM/118ML enema  1 enema  1 enema Rectal Once PRN Vania Rea, MD      . theophylline (THEODUR) 12 hr tablet 300 mg  300 mg Oral BID Elliot Cousin, MD   300 mg at 07/10/12 2328  . tiotropium (SPIRIVA) inhalation capsule 18 mcg  18 mcg Inhalation Daily Vania Rea, MD      . venlafaxine XR (EFFEXOR-XR) 24 hr capsule 300 mg  300 mg Oral Q breakfast Vania Rea, MD   300 mg at 07/11/12 0846  . DISCONTD: 0.9 %  sodium chloride infusion   Intravenous Continuous Ward Givens, MD 75 mL/hr at 07/10/12 1629    . DISCONTD: 0.9 %  sodium chloride infusion   Intravenous Continuous Ward Givens, MD      . DISCONTD: theophylline (THEO-24) 24 hr capsule 300 mg  300 mg Oral BID Vania Rea, MD       Facility-Administered Medications Ordered in Other Encounters  Medication Dose Route Frequency Provider Last Rate Last Dose  . iohexol (OMNIPAQUE) 300 MG/ML solution 100 mL  100 mL Intravenous Once PRN Medication Radiologist, MD   100 mL at 07/10/12 1020    Allergies as of 07/10/2012 - Review Complete 07/10/2012  Allergen Reaction Noted  . Sulfa antibiotics Other (See Comments) 04/22/2012    Family History  Problem Relation Age of Onset  . Hypertension Mother   . Hypertension Father   . Diabetes Mother   . Cancer Brother   . Diabetes Brother   . Diabetes Brother     History   Social History  . Marital Status: Married    Spouse Name: N/A    Number of Children: N/A  . Years of Education: N/A   Occupational History  . Service advisor for [employer]    Social History Main Topics  . Smoking status: Current Some Day Smoker -- 0.2 packs/day for 55 years  . Smokeless tobacco: Not on file  . Alcohol Use: No  .  Drug Use: No  . Sexually Active: Yes    Birth Control/ Protection: Surgical   Other Topics Concern  . Not on file   Social History Narrative  . No narrative on file    Review of Systems: Gen: Denies any fever, chills, sweats, anorexia, fatigue, weakness, malaise, weight loss,  and sleep disorder CV: Denies chest pain, angina, palpitations, syncope, orthopnea, PND, peripheral edema, and claudication. Resp: Denies dyspnea at rest, dyspnea with exercise, cough, sputum, wheezing, coughing up blood, and pleurisy. GI: jaundice, and fecal incontinence.   Denies dysphagia or odynophagia. Derm: Denies rash, itching, dry skin, hives, moles, warts, or unhealing ulcers.  Psych: Denies depression, anxiety, memory loss, suicidal ideation, hallucinations, paranoia, and confusion. Heme: Denies bruising, bleeding, and enlarged lymph nodes.   Physical Exam: Vital signs in last 24 hours: Temp:  [98.1 F (36.7 C)-98.8 F (37.1 C)] 98.1 F (36.7 C) (08/10 0500) Pulse Rate:  [84-101] 87  (08/10 0500) Resp:  [16-20] 20  (08/10 0500) BP: (131-157)/(70-90) 150/70 mmHg (08/10 0500) SpO2:  [95 %-100 %] 96 % (08/10 0721) Weight:  [135 lb (61.236 kg)-138 lb 14.2 oz (63 kg)] 138 lb 14.2 oz (63 kg) (08/10 0500) Last BM Date: 07/10/12 General:   Thin, somewhat chronically ill, appearing, pleasant and cooperative in NAD Head:  Normocephalic and atraumatic. Eyes:  Sclera clear, no icterus.   Conjunctiva pink. Ears:  Normal auditory acuity. Nose:  No deformity, discharge,  or lesions. Mouth:  No deformity or lesions, dentition normal. Neck:  Supple; no masses or thyromegaly. Lungs:  Clear throughout to auscultation.   Heart:  Regular rate and rhythm; no murmurs, clicks, rubs,  or gallops. Abdomen:  Flat. Positive bowel sounds. No bruits. Abdomen is soft with minimal tenderness to deep palpation left upper quadrant. No appreciable mass or organomegaly. No CVA tenderness Msk:  Symmetrical without gross deformities. Normal posture. Pulses:  Normal pulses noted. Extremities:  Without clubbing or edema. Neurologic:  Alert and  oriented x4;  grossly normal neurologically. Skin:  Intact without significant lesions or rashes. Cervical Nodes:  No significant cervical adenopathy. Psych:  Alert  and cooperative. Normal mood and affect.  Intake/Output from previous day: 08/09 0701 - 08/10 0700 In: 342.5 [I.V.:342.5] Out: 1650 [Urine:1650] Intake/Output this shift: Total I/O In: 220 [P.O.:220] Out: -   Lab Results:  Basename 07/11/12 0542 07/10/12 1608  WBC 4.8 5.7  HGB 10.2* 10.6*  HCT 33.3* 34.3*  PLT 194 228   BMET  Basename 07/11/12 0542 07/10/12 1608 07/08/12 1528  NA 138 134* --  K 3.9 3.8 --  CL 99 95* --  CO2 34* 33* --  GLUCOSE 121* 96 --  BUN 5* 6 --  CREATININE 0.59 0.65 0.64  CALCIUM 9.4 9.6 --   LFT  Basename 07/11/12 0542  PROT 6.4  ALBUMIN 2.9*  AST 9  ALT 6  ALKPHOS 112  BILITOT 0.3  BILIDIR --  IBILI --   PT/INR No results found for this basename: LABPROT:2,INR:2 in the last 72 hours Hepatitis Panel No results found for this basename: HEPBSAG,HCVAB,HEPAIGM,HEPBIGM in the last 72 hours C-Diff No results found for this basename: CDIFFTOX:3 in the last 72 hours  Studies/Results: Ct Abdomen Pelvis W Contrast  07/10/2012  *RADIOLOGY REPORT*  Clinical Data: Cecal polyps and sigmoid inflammatory changes on recent colonoscopy.  History of diabetes and lung cancer.  CT ABDOMEN AND PELVIS WITH CONTRAST  Technique:  Multidetector CT imaging of the abdomen and pelvis was performed following the standard protocol  during bolus administration of intravenous contrast.  Contrast: OMNIPAQUE IOHEXOL 300 MG/ML  SOLN  Comparison: Chest CT 04/22/2012.  Findings: Irregular subpleural scarring in both lung bases appears stable.  Of note, the spiculated mass demonstrated within the right lower lobe on the prior chest CT is not imaged but still appears to be present based on the scout film.  There is an irregular perinephric fluid collection superiorly on the left with peripheral enhancing fluid collections distorting the upper pole of the left kidney.  Components of this fluid collection measure up to 3.0 cm on image 21 and 3.2 cm on image 22.  There is no  significant inferior extension in the perinephric space.  No intra renal abnormality, hydronephrosis or delayed contrast excretion is seen.  The right kidney appears normal without cortical abnormality or perinephric fluid collection.  There are scattered calcified hepatic granulomas.  The liver otherwise appears normal.  There are small gallstones within the gallbladder lumen.  There is no gallbladder wall thickening or biliary dilatation.  The pancreatic tail abuts the left pararenal fluid collections and there appears to be a small amount of fluid adjacent to the pancreatic tail.  The pancreatic body and head appear normal.  The spleen and adrenal glands appear normal.  The splenic and portal veins are patent.  The stomach is incompletely distended.  There is colon wall thickening at the splenic flexure with minimal adjacent fluid in the left pericolic gutter.  The remainder of the colon appears unremarkable. No inflammatory changes are seen associated with the sigmoid colon.  There is no pelvic fluid collection or ascites. There is no extravasated enteric contrast or free intraperitoneal air.  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  There is aortoiliac atherosclerosis.  No pelvic mass is seen status post hysterectomy.  Facet degenerative changes are present in the lower lumbar spine associated with a mild convex left scoliosis.  IMPRESSION:  1.  Left upper quadrant inflammatory process with irregularly enhancing left perinephric fluid collections and colonic wall thickening at the splenic flexure. The findings are concerning for infection, potentially related to diverticulitis or recent colonoscopy.  Although there is a small amount of fluid adjacent to the pancreatic tail, pancreatitis is considered unlikely. 2.  No other intra-abdominal fluid collections identified. No extravasated enteric contrast or evidence of bowel obstruction. 3.  Cholelithiasis. 4.  Previously noted  right lower lobe spiculated lung mass is not imaged on the axial images but still appears to be present based on the scout image. This remains concerning for lung cancer.  These results were called by telephone on 07/10/2012 at 1145 hours to Dr. Jena Gauss, covering for Dr. Karilyn Cota, who verbally acknowledged these results.  Original Report Authenticated By: Gerrianne Scale, M.D.    Impression:    66 year old lady admitted to hospital after findings of marked abnormalities involving the left kidney/left: Concerning for infectious process, possibly diverticulitis versus perinephric abscess.  This process has nothing to do with her recent colonoscopy.  However, clinical scenario not really consistent with either diverticulitis or perinephric abscess.  CT ordered presumably because of the abnormal findings on colonoscopy but  biopsies negative for neoplasm.  We really do not have any evidence that this lady has a urinary tract infection, whatsoever.  I suspect the process has emanated from her left colon. She had focal abnormality in this area at the time of colonoscopy.  Diverticulitis or diverticular colitis cannot be ruled out at this time. In addition,  we really cannot totally dismiss the possibility of focal segmental colonic ischemia as well.  But, again, her clinical presentation would be a atypical for any of the above entities and she looks much better than her CT.  She is tolerating a regular diet.   Suggest:  Would transition her to oral antibiotics over the next 24-48 hours and complete a 10-14 day course empirically as recommended by Dr. Lovell Sheehan.  Agree with relatively short interval followup imaging of her abdomen to reassess this process. Would not do anything else aside from antibiotics in the short run as long as patient is doing well.  Continue OTC laxative/stool softeners for chronic constipation which seems to work well for the patient.  Lung lesion on recent imaging continues to be  bothersome. History of lung cancer. Recommend further evaluation per attending physician as appropriate.

## 2012-07-11 NOTE — Progress Notes (Signed)
Chart reviewed. CAT scan reviewed. Discussed with Dr. Lovell Sheehan.  Subjective: Some mild left-sided abdominal and flank pain. Started prior to colonoscopy and biopsy, but patient cannot recall the exact duration. No fevers or chills. No previous history of diverticulitis. Patient is asking for a regular diet and stent of heart healthy.  Objective: Vital signs in last 24 hours: Filed Vitals:   07/10/12 2251 07/10/12 2300 07/11/12 0500 07/11/12 0721  BP:  145/78 150/70   Pulse:  84 87   Temp:  98.2 F (36.8 C) 98.1 F (36.7 C)   TempSrc:  Oral Oral   Resp:  18 20   Height:      Weight:   63 kg (138 lb 14.2 oz)   SpO2: 98% 97% 95% 96%   Weight change:   Intake/Output Summary (Last 24 hours) at 07/11/12 0900 Last data filed at 07/11/12 0700  Gross per 24 hour  Intake  342.5 ml  Output   1650 ml  Net -1307.5 ml   General: Comfortable. Watching TV. Lungs clear to auscultation bilaterally without wheeze rhonchi or rales Cardiovascular regular rate rhythm without murmurs gallops rubs Abdomen soft nontender nondistended normal bowel sounds Extremities no clubbing cyanosis or edema  Lab Results: Basic Metabolic Panel:  Lab 07/11/12 4782 07/10/12 1608  NA 138 134*  K 3.9 3.8  CL 99 95*  CO2 34* 33*  GLUCOSE 121* 96  BUN 5* 6  CREATININE 0.59 0.65  CALCIUM 9.4 9.6  MG -- --  PHOS -- --   Liver Function Tests:  Lab 07/11/12 0542 07/10/12 1608  AST 9 11  ALT 6 6  ALKPHOS 112 119*  BILITOT 0.3 0.2*  PROT 6.4 7.0  ALBUMIN 2.9* 3.2*   No results found for this basename: LIPASE:2,AMYLASE:2 in the last 168 hours No results found for this basename: AMMONIA:2 in the last 168 hours CBC:  Lab 07/11/12 0542 07/10/12 1608  WBC 4.8 5.7  NEUTROABS -- 4.1  HGB 10.2* 10.6*  HCT 33.3* 34.3*  MCV 83.5 82.9  PLT 194 228   Cardiac Enzymes: No results found for this basename: CKTOTAL:3,CKMB:3,CKMBINDEX:3,TROPONINI:3 in the last 168 hours BNP: No results found for this basename:  PROBNP:3 in the last 168 hours D-Dimer: No results found for this basename: DDIMER:2 in the last 168 hours CBG: No results found for this basename: GLUCAP:6 in the last 168 hours Hemoglobin A1C: No results found for this basename: HGBA1C in the last 168 hours Fasting Lipid Panel: No results found for this basename: CHOL,HDL,LDLCALC,TRIG,CHOLHDL,LDLDIRECT in the last 956 hours Thyroid Function Tests: No results found for this basename: TSH,T4TOTAL,FREET4,T3FREE,THYROIDAB in the last 168 hours Coagulation: No results found for this basename: LABPROT:4,INR:4 in the last 168 hours Anemia Panel: No results found for this basename: VITAMINB12,FOLATE,FERRITIN,TIBC,IRON,RETICCTPCT in the last 168 hours Urine Drug Screen: Drugs of Abuse  No results found for this basename: labopia, cocainscrnur, labbenz, amphetmu, thcu, labbarb    Alcohol Level: No results found for this basename: ETH:2 in the last 168 hours Urinalysis:  Lab 07/11/12 0131 07/10/12 1927  COLORURINE YELLOW YELLOW  LABSPEC <1.005* 1.010  PHURINE 6.5 7.0  GLUCOSEU NEGATIVE NEGATIVE  HGBUR NEGATIVE NEGATIVE  BILIRUBINUR NEGATIVE NEGATIVE  KETONESUR NEGATIVE NEGATIVE  PROTEINUR NEGATIVE NEGATIVE  UROBILINOGEN 0.2 0.2  NITRITE NEGATIVE NEGATIVE  LEUKOCYTESUR NEGATIVE NEGATIVE   Micro Results: Recent Results (from the past 240 hour(s))  CULTURE, BLOOD (ROUTINE X 2)     Status: Normal (Preliminary result)   Collection Time   07/10/12  4:09  PM      Component Value Range Status Comment   Specimen Description BLOOD RIGHT ARM   Final    Special Requests BOTTLES DRAWN AEROBIC AND ANAEROBIC 6CC   Final    Culture PENDING   Incomplete    Report Status PENDING   Incomplete   CULTURE, BLOOD (ROUTINE X 2)     Status: Normal (Preliminary result)   Collection Time   07/10/12  4:20 PM      Component Value Range Status Comment   Specimen Description BLOOD LEFT ARM   Final    Special Requests BOTTLES DRAWN AEROBIC ONLY 8CC   Final     Culture PENDING   Incomplete    Report Status PENDING   Incomplete    Studies/Results: Ct Abdomen Pelvis W Contrast  07/10/2012  *RADIOLOGY REPORT*  Clinical Data: Cecal polyps and sigmoid inflammatory changes on recent colonoscopy.  History of diabetes and lung cancer.  CT ABDOMEN AND PELVIS WITH CONTRAST  Technique:  Multidetector CT imaging of the abdomen and pelvis was performed following the standard protocol during bolus administration of intravenous contrast.  Contrast: OMNIPAQUE IOHEXOL 300 MG/ML  SOLN  Comparison: Chest CT 04/22/2012.  Findings: Irregular subpleural scarring in both lung bases appears stable.  Of note, the spiculated mass demonstrated within the right lower lobe on the prior chest CT is not imaged but still appears to be present based on the scout film.  There is an irregular perinephric fluid collection superiorly on the left with peripheral enhancing fluid collections distorting the upper pole of the left kidney.  Components of this fluid collection measure up to 3.0 cm on image 21 and 3.2 cm on image 22.  There is no significant inferior extension in the perinephric space.  No intra renal abnormality, hydronephrosis or delayed contrast excretion is seen.  The right kidney appears normal without cortical abnormality or perinephric fluid collection.  There are scattered calcified hepatic granulomas.  The liver otherwise appears normal.  There are small gallstones within the gallbladder lumen.  There is no gallbladder wall thickening or biliary dilatation.  The pancreatic tail abuts the left pararenal fluid collections and there appears to be a small amount of fluid adjacent to the pancreatic tail.  The pancreatic body and head appear normal.  The spleen and adrenal glands appear normal.  The splenic and portal veins are patent.  The stomach is incompletely distended.  There is colon wall thickening at the splenic flexure with minimal adjacent fluid in the left pericolic gutter.   The remainder of the colon appears unremarkable. No inflammatory changes are seen associated with the sigmoid colon.  There is no pelvic fluid collection or ascites. There is no extravasated enteric contrast or free intraperitoneal air.  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  There is aortoiliac atherosclerosis.  No pelvic mass is seen status post hysterectomy.  Facet degenerative changes are present in the lower lumbar spine associated with a mild convex left scoliosis.  IMPRESSION:  1.  Left upper quadrant inflammatory process with irregularly enhancing left perinephric fluid collections and colonic wall thickening at the splenic flexure. The findings are concerning for infection, potentially related to diverticulitis or recent colonoscopy.  Although there is a small amount of fluid adjacent to the pancreatic tail, pancreatitis is considered unlikely. 2.  No other intra-abdominal fluid collections identified. No extravasated enteric contrast or evidence of bowel obstruction. 3.  Cholelithiasis. 4.  Previously noted right lower lobe spiculated  lung mass is not imaged on the axial images but still appears to be present based on the scout image. This remains concerning for lung cancer.  These results were called by telephone on 07/10/2012 at 1145 hours to Dr. Jena Gauss, covering for Dr. Karilyn Cota, who verbally acknowledged these results.  Original Report Authenticated By: Gerrianne Scale, M.D.   Scheduled Meds:   . acetaminophen  650 mg Oral Once  . albuterol  2.5 mg Nebulization BID  . albuterol  5 mg Nebulization Once  . amitriptyline  50 mg Oral QHS  . arformoterol  15 mcg Nebulization BID  . docusate sodium  200 mg Oral QPM  . fluticasone  2 spray Each Nare BH-q7a  . guaiFENesin  1,200 mg Oral BID  . ipratropium  0.5 mg Nebulization Once  . levothyroxine  88 mcg Oral QAC breakfast  . lisinopril  10 mg Oral BH-q7a  . pantoprazole  80 mg Oral BID AC  .  piperacillin-tazobactam  3.375 g Intravenous Once  . piperacillin-tazobactam (ZOSYN)  IV  3.375 g Intravenous Q8H  . sodium chloride  700 mL Intravenous Once  . sodium chloride      . theophylline  300 mg Oral BID  . tiotropium  18 mcg Inhalation Daily  . venlafaxine XR  300 mg Oral Q breakfast  . DISCONTD: theophylline  300 mg Oral BID   Continuous Infusions:   . 0.9 % NaCl with KCl 20 mEq / L 50 mL/hr (07/10/12 2209)  . DISCONTD: sodium chloride 75 mL/hr at 07/10/12 1629  . DISCONTD: sodium chloride     PRN Meds:.acetaminophen, albuterol, bisacodyl, busPIRone, HYDROmorphone (DILAUDID) injection, ondansetron (ZOFRAN) IV, oxyCODONE, sodium phosphate Assessment/Plan: Principal Problem:  *Intra-abdominal abscess Active Problems:  Anemia  HTN (hypertension)  COPD (chronic obstructive pulmonary disease)  Tobacco abuse  History of lung cancer  Hypothyroidism  Patient's urinalysis is clean. Suspect that the abscess emanates from the colon at the splenic flexure rather than the kidney. Patient reports that her discomfort started before her colonoscopy. Biopsies taken during colonoscopy were benign. No leukocytosis or fever. Area is not amenable to percutaneous drainage. Patient is tolerating solid diet. Would recommend several weeks of antibiotics then rescan. Will get [facility] opinion. Dr. Lovell Sheehan agrees.   LOS: 1 day   Jeri Jeanbaptiste L 07/11/2012, 9:00 AM

## 2012-07-11 NOTE — Consult Note (Signed)
Reason for Consult: Left upper quadrant abdominal abscess Referring Physician: [facility] hospitalist, Dr. Gillian Scarce Tracey Martinez is an 66 y.o. female.  HPI: Patient is a 66 year old black female who underwent a colonoscopy by Dr. Dionicia Abler on 07/02/2012. He found small cecal polyps and significant narrowing and inflammatory changes in the proximal sigmoid colon. Biopsies were negative for malignancy.  A CT scan the abdomen and pelvis was performed which revealed a left perinephric fluid collection and thickening of the splenic flexure. There is also a possible spiculated mass in the left lung. Apparently, she did not want coming in the hospital but was admitted by the hospitalist for further evaluation treatment.  She denies any abdominal pain. No nausea or vomiting have been noted. Patient is somewhat a difficult historian.  Past Medical History  Diagnosis Date  . Aneurysm     brain  . GERD (gastroesophageal reflux disease)   . Hypertension   . Anxiety   . Oxygen dependent     requires oxygen for at least 16 hours per day,   . Hypothyroidism   . COPD (chronic obstructive pulmonary disease)     on home oxygen  . Anemia     iron deficiency anemia  . Gastric erosions     causing anemia  . Chronic respiratory failure   . Anemia   . Gastric ulcer   . Borderline diabetes   . Cancer   . Lung cancer     Past Surgical History  Procedure Date  . Lung removal, partial   . Abdominal hysterectomy   . Esophagogastroduodenoscopy 05/11/2012    Procedure: ESOPHAGOGASTRODUODENOSCOPY (EGD);  Surgeon: Malissa Hippo, MD;  Location: [facility];  Service: Endoscopy;  Laterality: N/A;  . Cataract extraction, bilateral     Family History  Problem Relation Age of Onset  . Hypertension Mother   . Hypertension Father   . Diabetes Mother   . Cancer Brother   . Diabetes Brother   . Diabetes Brother     Social History:  reports that she has been smoking.  She does not have any smokeless  tobacco history on file. She reports that she does not drink alcohol or use illicit drugs.  Allergies:  Allergies  Allergen Reactions  . Sulfa Antibiotics Other (See Comments)    HANDS,FEET AND MOUTH BLISTERING    Medications: I have reviewed the patient's current medications.  Results for orders placed during the hospital encounter of 07/10/12 (from the past 48 hour(s))  CBC WITH DIFFERENTIAL     Status: Abnormal   Collection Time   07/10/12  4:08 PM      Component Value Range Comment   WBC 5.7  4.0 - 10.5 K/uL    RBC 4.14  3.87 - 5.11 MIL/uL    Hemoglobin 10.6 (*) 12.0 - 15.0 g/dL    HCT 16.1 (*) 09.6 - 46.0 %    MCV 82.9  78.0 - 100.0 fL    MCH 25.6 (*) 26.0 - 34.0 pg    MCHC 30.9  30.0 - 36.0 g/dL    RDW 04.5 (*) 40.9 - 15.5 %    Platelets 228  150 - 400 K/uL    Neutrophils Relative 72  43 - 77 %    Neutro Abs 4.1  1.7 - 7.7 K/uL    Lymphocytes Relative 19  12 - 46 %    Lymphs Abs 1.1  0.7 - 4.0 K/uL    Monocytes Relative 7  3 - 12 %  Monocytes Absolute 0.4  0.1 - 1.0 K/uL    Eosinophils Relative 2  0 - 5 %    Eosinophils Absolute 0.1  0.0 - 0.7 K/uL    Basophils Relative 0  0 - 1 %    Basophils Absolute 0.0  0.0 - 0.1 K/uL   COMPREHENSIVE METABOLIC PANEL     Status: Abnormal   Collection Time   07/10/12  4:08 PM      Component Value Range Comment   Sodium 134 (*) 135 - 145 mEq/L    Potassium 3.8  3.5 - 5.1 mEq/L    Chloride 95 (*) 96 - 112 mEq/L    CO2 33 (*) 19 - 32 mEq/L    Glucose, Bld 96  70 - 99 mg/dL    BUN 6  6 - 23 mg/dL    Creatinine, Ser 1.61  0.50 - 1.10 mg/dL    Calcium 9.6  8.4 - 09.6 mg/dL    Total Protein 7.0  6.0 - 8.3 g/dL    Albumin 3.2 (*) 3.5 - 5.2 g/dL    AST 11  0 - 37 U/L    ALT 6  0 - 35 U/L    Alkaline Phosphatase 119 (*) 39 - 117 U/L    Total Bilirubin 0.2 (*) 0.3 - 1.2 mg/dL    GFR calc non Af Amer 90 (*) >90 mL/min    GFR calc Af Amer >90  >90 mL/min   CULTURE, BLOOD (ROUTINE X 2)     Status: Normal (Preliminary result)    Collection Time   07/10/12  4:09 PM      Component Value Range Comment   Specimen Description BLOOD RIGHT ARM      Special Requests BOTTLES DRAWN AEROBIC AND ANAEROBIC 6CC      Culture PENDING      Report Status PENDING     CULTURE, BLOOD (ROUTINE X 2)     Status: Normal (Preliminary result)   Collection Time   07/10/12  4:20 PM      Component Value Range Comment   Specimen Description BLOOD LEFT ARM      Special Requests BOTTLES DRAWN AEROBIC ONLY 8CC      Culture PENDING      Report Status PENDING     URINALYSIS, ROUTINE W REFLEX MICROSCOPIC     Status: Normal   Collection Time   07/10/12  7:27 PM      Component Value Range Comment   Color, Urine YELLOW  YELLOW    APPearance CLEAR  CLEAR    Specific Gravity, Urine 1.010  1.005 - 1.030    pH 7.0  5.0 - 8.0    Glucose, UA NEGATIVE  NEGATIVE mg/dL    Hgb urine dipstick NEGATIVE  NEGATIVE    Bilirubin Urine NEGATIVE  NEGATIVE    Ketones, ur NEGATIVE  NEGATIVE mg/dL    Protein, ur NEGATIVE  NEGATIVE mg/dL    Urobilinogen, UA 0.2  0.0 - 1.0 mg/dL    Nitrite NEGATIVE  NEGATIVE    Leukocytes, UA NEGATIVE  NEGATIVE MICROSCOPIC NOT DONE ON URINES WITH NEGATIVE PROTEIN, BLOOD, LEUKOCYTES, NITRITE, OR GLUCOSE <1000 mg/dL.  URINALYSIS, ROUTINE W REFLEX MICROSCOPIC     Status: Abnormal   Collection Time   07/11/12  1:31 AM      Component Value Range Comment   Color, Urine YELLOW  YELLOW    APPearance CLEAR  CLEAR    Specific Gravity, Urine <1.005 (*) 1.005 - 1.030  pH 6.5  5.0 - 8.0    Glucose, UA NEGATIVE  NEGATIVE mg/dL    Hgb urine dipstick NEGATIVE  NEGATIVE    Bilirubin Urine NEGATIVE  NEGATIVE    Ketones, ur NEGATIVE  NEGATIVE mg/dL    Protein, ur NEGATIVE  NEGATIVE mg/dL    Urobilinogen, UA 0.2  0.0 - 1.0 mg/dL    Nitrite NEGATIVE  NEGATIVE    Leukocytes, UA NEGATIVE  NEGATIVE MICROSCOPIC NOT DONE ON URINES WITH NEGATIVE PROTEIN, BLOOD, LEUKOCYTES, NITRITE, OR GLUCOSE <1000 mg/dL.  CBC     Status: Abnormal   Collection Time    07/11/12  5:42 AM      Component Value Range Comment   WBC 4.8  4.0 - 10.5 K/uL    RBC 3.99  3.87 - 5.11 MIL/uL    Hemoglobin 10.2 (*) 12.0 - 15.0 g/dL    HCT 29.5 (*) 62.1 - 46.0 %    MCV 83.5  78.0 - 100.0 fL    MCH 25.6 (*) 26.0 - 34.0 pg    MCHC 30.6  30.0 - 36.0 g/dL    RDW 30.8 (*) 65.7 - 15.5 %    Platelets 194  150 - 400 K/uL   COMPREHENSIVE METABOLIC PANEL     Status: Abnormal   Collection Time   07/11/12  5:42 AM      Component Value Range Comment   Sodium 138  135 - 145 mEq/L    Potassium 3.9  3.5 - 5.1 mEq/L    Chloride 99  96 - 112 mEq/L    CO2 34 (*) 19 - 32 mEq/L    Glucose, Bld 121 (*) 70 - 99 mg/dL    BUN 5 (*) 6 - 23 mg/dL    Creatinine, Ser 8.46  0.50 - 1.10 mg/dL    Calcium 9.4  8.4 - 96.2 mg/dL    Total Protein 6.4  6.0 - 8.3 g/dL    Albumin 2.9 (*) 3.5 - 5.2 g/dL    AST 9  0 - 37 U/L    ALT 6  0 - 35 U/L    Alkaline Phosphatase 112  39 - 117 U/L    Total Bilirubin 0.3  0.3 - 1.2 mg/dL    GFR calc non Af Amer >90  >90 mL/min    GFR calc Af Amer >90  >90 mL/min     Ct Abdomen Pelvis W Contrast  07/10/2012  *RADIOLOGY REPORT*  Clinical Data: Cecal polyps and sigmoid inflammatory changes on recent colonoscopy.  History of diabetes and lung cancer.  CT ABDOMEN AND PELVIS WITH CONTRAST  Technique:  Multidetector CT imaging of the abdomen and pelvis was performed following the standard protocol during bolus administration of intravenous contrast.  Contrast: OMNIPAQUE IOHEXOL 300 MG/ML  SOLN  Comparison: Chest CT 04/22/2012.  Findings: Irregular subpleural scarring in both lung bases appears stable.  Of note, the spiculated mass demonstrated within the right lower lobe on the prior chest CT is not imaged but still appears to be present based on the scout film.  There is an irregular perinephric fluid collection superiorly on the left with peripheral enhancing fluid collections distorting the upper pole of the left kidney.  Components of this fluid collection measure  up to 3.0 cm on image 21 and 3.2 cm on image 22.  There is no significant inferior extension in the perinephric space.  No intra renal abnormality, hydronephrosis or delayed contrast excretion is seen.  The right kidney appears normal without  cortical abnormality or perinephric fluid collection.  There are scattered calcified hepatic granulomas.  The liver otherwise appears normal.  There are small gallstones within the gallbladder lumen.  There is no gallbladder wall thickening or biliary dilatation.  The pancreatic tail abuts the left pararenal fluid collections and there appears to be a small amount of fluid adjacent to the pancreatic tail.  The pancreatic body and head appear normal.  The spleen and adrenal glands appear normal.  The splenic and portal veins are patent.  The stomach is incompletely distended.  There is colon wall thickening at the splenic flexure with minimal adjacent fluid in the left pericolic gutter.  The remainder of the colon appears unremarkable. No inflammatory changes are seen associated with the sigmoid colon.  There is no pelvic fluid collection or ascites. There is no extravasated enteric contrast or free intraperitoneal air.  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  There is aortoiliac atherosclerosis.  No pelvic mass is seen status post hysterectomy.  Facet degenerative changes are present in the lower lumbar spine associated with a mild convex left scoliosis.  IMPRESSION:  1.  Left upper quadrant inflammatory process with irregularly enhancing left perinephric fluid collections and colonic wall thickening at the splenic flexure. The findings are concerning for infection, potentially related to diverticulitis or recent colonoscopy.  Although there is a small amount of fluid adjacent to the pancreatic tail, pancreatitis is considered unlikely. 2.  No other intra-abdominal fluid collections identified. No extravasated enteric contrast or evidence of  bowel obstruction. 3.  Cholelithiasis. 4.  Previously noted right lower lobe spiculated lung mass is not imaged on the axial images but still appears to be present based on the scout image. This remains concerning for lung cancer.  These results were called by telephone on 07/10/2012 at 1145 hours to Dr. Jena Gauss, covering for Dr. Karilyn Cota, who verbally acknowledged these results.  Original Report Authenticated By: Gerrianne Scale, M.D.    ROS: See chart Blood pressure 150/70, pulse 87, temperature 98.1 F (36.7 C), temperature source Oral, resp. rate 20, height 5\' 7"  (1.702 m), weight 63 kg (138 lb 14.2 oz), SpO2 96.00%. Physical Exam: Pleasant black female no acute distress. Back examination reveals no CVA tenderness. Abdomen is soft. No rigidity noted. It is difficult to elicit any specific abdominal pain.  Assessment/Plan: Impression: Perinephric abscess, question colitis/diverticulitis. Doubt actual perforation from colonoscopy. This process may have been going on prior to the colonoscopy and thus explains those findings. In any event, she does not need acute surgical intervention. I will continue a full course of antibiotics for approximately 10 days to 2 weeks. I would also suggest a followup CAT scan to assess the abdomen again. Will follow with you.  Lamia Mariner A 07/11/2012, 9:02 AM

## 2012-07-12 LAB — URINE CULTURE: Colony Count: 2000

## 2012-07-12 MED ORDER — METRONIDAZOLE 500 MG PO TABS
500.0000 mg | ORAL_TABLET | Freq: Three times a day (TID) | ORAL | Status: DC
  Administered 2012-07-12: 500 mg via ORAL
  Filled 2012-07-12: qty 1

## 2012-07-12 MED ORDER — CIPROFLOXACIN HCL 500 MG PO TABS: 500.0000 mg | ORAL_TABLET | Freq: Two times a day (BID) | ORAL | Status: AC

## 2012-07-12 MED ORDER — METRONIDAZOLE 500 MG PO TABS: 500.0000 mg | ORAL_TABLET | Freq: Three times a day (TID) | ORAL | Status: AC

## 2012-07-12 MED ORDER — CIPROFLOXACIN HCL 250 MG PO TABS
500.0000 mg | ORAL_TABLET | Freq: Two times a day (BID) | ORAL | Status: DC
Start: 2012-07-12 — End: 2012-07-12
  Administered 2012-07-12: 500 mg via ORAL
  Filled 2012-07-12: qty 2

## 2012-07-12 NOTE — Progress Notes (Signed)
Notes reviewed.  Pt has no pain today.  Tolerating diet.  Remains in NSR.  Has known lung cancer and is followed at [facility] with serial scans.  Will change to PO abx, arrange outpt CT abdomen in 2 weeks, f/u with Dr. Karilyn Cota after CT scan. Radiology tech to burn CD ROM of previous CT angio chest for pt to f/u with oncologist at [facility].  Discharge home if ok with consultants.  No further w/u needed as inpatient.

## 2012-07-12 NOTE — Progress Notes (Signed)
Patient received discharge instructions along with follow up appointments and prescriptions. Patient verbalized understanding of all instructions. Patient was escorted by staff via wheelchair to vehicle. Patient discharged to home in stable condition. 

## 2012-07-12 NOTE — Discharge Summary (Signed)
Physician Discharge Summary  Patient ID: Tracey Martinez MRN: [id number] DOB/AGE: [birth year] 66 y.o.  Admit date: 07/10/2012 Discharge date: 07/12/2012  Discharge Diagnoses:  Principal Problem:  *Intra-abdominal abscess Active Problems:  Anemia  HTN (hypertension)  COPD (chronic obstructive pulmonary disease)  Tobacco abuse  History of lung cancer  Hypothyroidism   Medication List  As of 07/12/2012  9:37 AM   TAKE these medications         acetaminophen 500 MG tablet   Commonly known as: TYLENOL   Take 1,000 mg by mouth every 4 (four) hours as needed. For pain      albuterol (2.5 MG/3ML) 0.083% nebulizer solution   Commonly known as: PROVENTIL   Take 2.5 mg by nebulization 2 (two) times daily.      amitriptyline 25 MG tablet   Commonly known as: ELAVIL   Take 50 mg by mouth at bedtime.      arformoterol 15 MCG/2ML Nebu   Commonly known as: BROVANA   Take 15 mcg by nebulization 2 (two) times daily.      busPIRone 10 MG tablet   Commonly known as: BUSPAR   Take 10 mg by mouth 3 (three) times daily as needed. For anxiety      CALCIUM-MAGNESIUM-ZINC PO   Take 1 tablet by mouth every morning.      cholecalciferol 1000 UNITS tablet   Commonly known as: VITAMIN D   Take 1,000 Units by mouth every morning.      ciprofloxacin 500 MG tablet   Commonly known as: CIPRO   Take 1 tablet (500 mg total) by mouth 2 (two) times daily.      docusate sodium 100 MG capsule   Commonly known as: COLACE   Take 200 mg by mouth every evening.      fluticasone 50 MCG/ACT nasal spray   Commonly known as: FLONASE   Place 2 sprays into the nose every morning.      furosemide 20 MG tablet   Commonly known as: LASIX   Take 20 mg by mouth every morning.      levothyroxine 88 MCG tablet   Commonly known as: SYNTHROID, LEVOTHROID   Take 88 mcg by mouth daily before breakfast.      lisinopril 20 MG tablet   Commonly known as: PRINIVIL,ZESTRIL   Take 10 mg by mouth every morning.     metroNIDAZOLE 500 MG tablet   Commonly known as: FLAGYL   Take 1 tablet (500 mg total) by mouth every 8 (eight) hours.      omeprazole 40 MG capsule   Commonly known as: PRILOSEC   Take 1 capsule (40 mg total) by mouth 2 (two) times daily.      Potassium Gluconate 595 MG Caps   Take 1 capsule by mouth every morning.      SENNA PLUS 8.6-50 MG per tablet   Generic drug: senna-docusate   Take 2 tablets by mouth at bedtime as needed. Constipation      theophylline 300 MG 24 hr capsule   Commonly known as: THEO-24   Take 300 mg by mouth 2 (two) times daily.      tiotropium 18 MCG inhalation capsule   Commonly known as: SPIRIVA   Place 18 mcg into inhaler and inhale daily.      venlafaxine XR 150 MG 24 hr capsule   Commonly known as: EFFEXOR-XR   Take 300 mg by mouth daily with breakfast.  Discharge Orders    Future Orders Please Complete By Expires   Diet - low sodium heart healthy      Activity as tolerated - No restrictions         Follow-up Information    Follow up with REPEAT CT SCAN OF THE ABDOMEN IN 2 WEEKS. (RADIOLOGY DEPARTMENT WILL CALL YOU WITH TIME AND DATE)       Follow up with REHMAN,NAJEEB U, MD. Schedule an appointment as soon as possible for a visit in 2 weeks. (AFTER CT SCAN COMPLETE)    Contact information:   [address], [address] [place] [address] [phone]       Schedule an appointment as soon as possible for a visit with YOUR ONCOLOGIST AT [facility]. (BRING DISCS WITH YOU TO FOLLOW UP LUNG CANCER)          Disposition: 01-Home or Self Care  Discharged Condition:  Stable  Consults: Treatment Team:  Dalia Heading, MD Corbin Ade, MD  Labs:   Results for orders placed during the hospital encounter of 07/10/12 (from the past 48 hour(s))  CBC WITH DIFFERENTIAL     Status: Abnormal   Collection Time   07/10/12  4:08 PM      Component Value Range Comment   WBC 5.7  4.0 - 10.5 K/uL    RBC 4.14  3.87 - 5.11 MIL/uL     Hemoglobin 10.6 (*) 12.0 - 15.0 g/dL    HCT 91.4 (*) 78.2 - 46.0 %    MCV 82.9  78.0 - 100.0 fL    MCH 25.6 (*) 26.0 - 34.0 pg    MCHC 30.9  30.0 - 36.0 g/dL    RDW 95.6 (*) 21.3 - 15.5 %    Platelets 228  150 - 400 K/uL    Neutrophils Relative 72  43 - 77 %    Neutro Abs 4.1  1.7 - 7.7 K/uL    Lymphocytes Relative 19  12 - 46 %    Lymphs Abs 1.1  0.7 - 4.0 K/uL    Monocytes Relative 7  3 - 12 %    Monocytes Absolute 0.4  0.1 - 1.0 K/uL    Eosinophils Relative 2  0 - 5 %    Eosinophils Absolute 0.1  0.0 - 0.7 K/uL    Basophils Relative 0  0 - 1 %    Basophils Absolute 0.0  0.0 - 0.1 K/uL   COMPREHENSIVE METABOLIC PANEL     Status: Abnormal   Collection Time   07/10/12  4:08 PM      Component Value Range Comment   Sodium 134 (*) 135 - 145 mEq/L    Potassium 3.8  3.5 - 5.1 mEq/L    Chloride 95 (*) 96 - 112 mEq/L    CO2 33 (*) 19 - 32 mEq/L    Glucose, Bld 96  70 - 99 mg/dL    BUN 6  6 - 23 mg/dL    Creatinine, Ser 0.86  0.50 - 1.10 mg/dL    Calcium 9.6  8.4 - 57.8 mg/dL    Total Protein 7.0  6.0 - 8.3 g/dL    Albumin 3.2 (*) 3.5 - 5.2 g/dL    AST 11  0 - 37 U/L    ALT 6  0 - 35 U/L    Alkaline Phosphatase 119 (*) 39 - 117 U/L    Total Bilirubin 0.2 (*) 0.3 - 1.2 mg/dL    GFR calc  non Af Amer 90 (*) >90 mL/min    GFR calc Af Amer >90  >90 mL/min   CULTURE, BLOOD (ROUTINE X 2)     Status: Normal (Preliminary result)   Collection Time   07/10/12  4:09 PM      Component Value Range Comment   Specimen Description BLOOD RIGHT ARM      Special Requests BOTTLES DRAWN AEROBIC AND ANAEROBIC 6CC      Culture NO GROWTH 1 DAY      Report Status PENDING     CULTURE, BLOOD (ROUTINE X 2)     Status: Normal (Preliminary result)   Collection Time   07/10/12  4:20 PM      Component Value Range Comment   Specimen Description BLOOD LEFT ARM      Special Requests BOTTLES DRAWN AEROBIC ONLY 8CC      Culture NO GROWTH 1 DAY      Report Status PENDING     URINALYSIS, ROUTINE W REFLEX  MICROSCOPIC     Status: Normal   Collection Time   07/10/12  7:27 PM      Component Value Range Comment   Color, Urine YELLOW  YELLOW    APPearance CLEAR  CLEAR    Specific Gravity, Urine 1.010  1.005 - 1.030    pH 7.0  5.0 - 8.0    Glucose, UA NEGATIVE  NEGATIVE mg/dL    Hgb urine dipstick NEGATIVE  NEGATIVE    Bilirubin Urine NEGATIVE  NEGATIVE    Ketones, ur NEGATIVE  NEGATIVE mg/dL    Protein, ur NEGATIVE  NEGATIVE mg/dL    Urobilinogen, UA 0.2  0.0 - 1.0 mg/dL    Nitrite NEGATIVE  NEGATIVE    Leukocytes, UA NEGATIVE  NEGATIVE MICROSCOPIC NOT DONE ON URINES WITH NEGATIVE PROTEIN, BLOOD, LEUKOCYTES, NITRITE, OR GLUCOSE <1000 mg/dL.  URINALYSIS, ROUTINE W REFLEX MICROSCOPIC     Status: Abnormal   Collection Time   07/11/12  1:31 AM      Component Value Range Comment   Color, Urine YELLOW  YELLOW    APPearance CLEAR  CLEAR    Specific Gravity, Urine <1.005 (*) 1.005 - 1.030    pH 6.5  5.0 - 8.0    Glucose, UA NEGATIVE  NEGATIVE mg/dL    Hgb urine dipstick NEGATIVE  NEGATIVE    Bilirubin Urine NEGATIVE  NEGATIVE    Ketones, ur NEGATIVE  NEGATIVE mg/dL    Protein, ur NEGATIVE  NEGATIVE mg/dL    Urobilinogen, UA 0.2  0.0 - 1.0 mg/dL    Nitrite NEGATIVE  NEGATIVE    Leukocytes, UA NEGATIVE  NEGATIVE MICROSCOPIC NOT DONE ON URINES WITH NEGATIVE PROTEIN, BLOOD, LEUKOCYTES, NITRITE, OR GLUCOSE <1000 mg/dL.  CBC     Status: Abnormal   Collection Time   07/11/12  5:42 AM      Component Value Range Comment   WBC 4.8  4.0 - 10.5 K/uL    RBC 3.99  3.87 - 5.11 MIL/uL    Hemoglobin 10.2 (*) 12.0 - 15.0 g/dL    HCT 16.1 (*) 09.6 - 46.0 %    MCV 83.5  78.0 - 100.0 fL    MCH 25.6 (*) 26.0 - 34.0 pg    MCHC 30.6  30.0 - 36.0 g/dL    RDW 04.5 (*) 40.9 - 15.5 %    Platelets 194  150 - 400 K/uL   COMPREHENSIVE METABOLIC PANEL     Status: Abnormal   Collection Time  07/11/12  5:42 AM      Component Value Range Comment   Sodium 138  135 - 145 mEq/L    Potassium 3.9  3.5 - 5.1 mEq/L     Chloride 99  96 - 112 mEq/L    CO2 34 (*) 19 - 32 mEq/L    Glucose, Bld 121 (*) 70 - 99 mg/dL    BUN 5 (*) 6 - 23 mg/dL    Creatinine, Ser 2.95  0.50 - 1.10 mg/dL    Calcium 9.4  8.4 - 62.1 mg/dL    Total Protein 6.4  6.0 - 8.3 g/dL    Albumin 2.9 (*) 3.5 - 5.2 g/dL    AST 9  0 - 37 U/L    ALT 6  0 - 35 U/L    Alkaline Phosphatase 112  39 - 117 U/L    Total Bilirubin 0.3  0.3 - 1.2 mg/dL    GFR calc non Af Amer >90  >90 mL/min    GFR calc Af Amer >90  >90 mL/min     Diagnostics:  Ct Abdomen Pelvis W Contrast  07/10/2012  *RADIOLOGY REPORT*  Clinical Data: Cecal polyps and sigmoid inflammatory changes on recent colonoscopy.  History of diabetes and lung cancer.  CT ABDOMEN AND PELVIS WITH CONTRAST  Technique:  Multidetector CT imaging of the abdomen and pelvis was performed following the standard protocol during bolus administration of intravenous contrast.  Contrast: OMNIPAQUE IOHEXOL 300 MG/ML  SOLN  Comparison: Chest CT 04/22/2012.  Findings: Irregular subpleural scarring in both lung bases appears stable.  Of note, the spiculated mass demonstrated within the right lower lobe on the prior chest CT is not imaged but still appears to be present based on the scout film.  There is an irregular perinephric fluid collection superiorly on the left with peripheral enhancing fluid collections distorting the upper pole of the left kidney.  Components of this fluid collection measure up to 3.0 cm on image 21 and 3.2 cm on image 22.  There is no significant inferior extension in the perinephric space.  No intra renal abnormality, hydronephrosis or delayed contrast excretion is seen.  The right kidney appears normal without cortical abnormality or perinephric fluid collection.  There are scattered calcified hepatic granulomas.  The liver otherwise appears normal.  There are small gallstones within the gallbladder lumen.  There is no gallbladder wall thickening or biliary dilatation.  The pancreatic tail  abuts the left pararenal fluid collections and there appears to be a small amount of fluid adjacent to the pancreatic tail.  The pancreatic body and head appear normal.  The spleen and adrenal glands appear normal.  The splenic and portal veins are patent.  The stomach is incompletely distended.  There is colon wall thickening at the splenic flexure with minimal adjacent fluid in the left pericolic gutter.  The remainder of the colon appears unremarkable. No inflammatory changes are seen associated with the sigmoid colon.  There is no pelvic fluid collection or ascites. There is no extravasated enteric contrast or free intraperitoneal air.  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  There is aortoiliac atherosclerosis.  No pelvic mass is seen status post hysterectomy.  Facet degenerative changes are present in the lower lumbar spine associated with a mild convex left scoliosis.  IMPRESSION:  1.  Left upper quadrant inflammatory process with irregularly enhancing left perinephric fluid collections and colonic wall thickening at the splenic flexure. The findings are concerning  for infection, potentially related to diverticulitis or recent colonoscopy.  Although there is a small amount of fluid adjacent to the pancreatic tail, pancreatitis is considered unlikely. 2.  No other intra-abdominal fluid collections identified. No extravasated enteric contrast or evidence of bowel obstruction. 3.  Cholelithiasis. 4.  Previously noted right lower lobe spiculated lung mass is not imaged on the axial images but still appears to be present based on the scout image. This remains concerning for lung cancer.  These results were called by telephone on 07/10/2012 at 1145 hours to Dr. Jena Gauss, covering for Dr. Karilyn Cota, who verbally acknowledged these results.  Original Report Authenticated By: Gerrianne Scale, M.D.   Procedures: none  Full Code   Hospital Course: See H&P for complete admission  details. Ms. Hendel is a pleasant 66 year old black female who had an outpatient CAT scan done. Incidentally found was an abscess near the left kidney and splenic flexure of the colon. Apparently, she had had an outpatient colonoscopy by Dr. Dionicia Abler on August 1. It was concerning for malignancy. Biopsies however were negative were benign, so a CT of the abdomen and pelvis was ordered. Patient had had no fevers or chills. No dysuria hematuria. She did have some mild left-sided abdominal pain that started prior to her colonoscopy. CAT scan showed an abscess near the left kidney, possibly related to the inflammation of the colon. There was no free air. Patient was instructed by GI to come to the emergency room for admission. She was admitted to the hospitalist service. She was started on IV antibiotics. Her abdomen was soft and minimally tender. Patient felt well and actually initially resisted admission. She had a normal white blood cell count and normal vital signs. Surgery and GI were consulted. Patient's pain is resolved. She has been switched to oral antibiotics. She will need 2 weeks of Cipro and Flagyl, then repeat the CAT scan. Followup with Dr. Dionicia Abler. Today is Sunday and as such we are unable to schedule appointments but I will follow this up tomorrow. Dr. Kendell Bane has spoken with Dr. Dionicia Abler.  Patient has a history of lung cancer and on previous CT angiogram showed a spiculated mass. It's unclear whether this is chronic or new. I have made copies on CD-ROM and have asked patient to followup with her oncologist. She is followed at [facility] with serial scans. I will call patient's primary care provider tomorrow to ensure followup plans. Total time on the day of discharge greater than 30 minutes.  Discharge Exam:  Blood pressure 166/88, pulse 92, temperature 98.1 F (36.7 C), temperature source Oral, resp. rate 20, height 5\' 7"  (1.702 m), weight 64.32 kg (141 lb 12.8 oz), SpO2 98.00%.  Lungs CTA without WRR Abd  S, NT, ND  Signed: Elbia Paro L 07/12/2012, 9:37 AM

## 2012-07-15 LAB — CULTURE, BLOOD (ROUTINE X 2)
Culture: NO GROWTH
Culture: NO GROWTH

## 2012-07-23 ENCOUNTER — Encounter (INDEPENDENT_AMBULATORY_CARE_PROVIDER_SITE_OTHER): Payer: Self-pay | Admitting: Internal Medicine

## 2012-07-23 ENCOUNTER — Ambulatory Visit (INDEPENDENT_AMBULATORY_CARE_PROVIDER_SITE_OTHER): Payer: Medicare Other | Admitting: Internal Medicine

## 2012-07-23 VITALS — BP 116/70 | HR 72 | Temp 99.1°F | Resp 20 | Ht 67.0 in | Wt 133.3 lb

## 2012-07-23 DIAGNOSIS — K56609 Unspecified intestinal obstruction, unspecified as to partial versus complete obstruction: Secondary | ICD-10-CM

## 2012-07-23 DIAGNOSIS — K56699 Other intestinal obstruction unspecified as to partial versus complete obstruction: Secondary | ICD-10-CM

## 2012-07-23 NOTE — Patient Instructions (Signed)
Abdominopelvic CT to be scheduled. 

## 2012-07-23 NOTE — Progress Notes (Signed)
Presenting complaint;  Followup for colonic wall thickening and pericolonic inflammatory process.  Subjective:  Patient is 66 year old African female who was initially evaluated in early June,2013 for profound anemia and GI bleed. EGD revealed long wide linear ulcer felt to be source of bleeding. She returned on 07/02/2012 for followup EGD and colonoscopy. Esophageal ulcer had completely healed. Colonoscopy was completed to cecum. She had small tubular adenoma and serrated adenoma removed from her cecum. She was also found to have  abnormality to mucosa felt to be suspicious for neoplasm in the region of proximal sigmoid or descending colon. Biopsy was negative for malignancy but suggested chronic inflammatory changes. She was therefore brought back for abdominopelvic CT which she had on 07/08/2012. It showed colonic wall thickening in the region of splenic flexure and descending colon with very colonic inflammatory changes as well as perinephric changes and small fluid collection in pancreatic tail. Infection was suspected from colonic or renal source. Patient was treated with antibiotics. She was seen by Dr. Lovell Sheehan it was felt she did not need surgical intervention. Patient denies abdominal pain fever chills nausea or vomiting. As a matter of fact she does not recall she is having abdominal pain in the last several weeks. She has poor appetite and forcing herself to eat. She has not lost any weight since her last admission. She denies melena or rectal bleeding. She has had diarrhea ever since she's been on antibiotic therapy generally 3-4 stools per day. She will finish her antibiotics. .  Current Medications: Current Outpatient Prescriptions  Medication Sig Dispense Refill  . acetaminophen (TYLENOL) 500 MG tablet Take 1,000 mg by mouth every 4 (four) hours as needed. For pain      . albuterol (PROVENTIL) (2.5 MG/3ML) 0.083% nebulizer solution Take 2.5 mg by nebulization 2 (two) times daily.         Marland Kitchen amitriptyline (ELAVIL) 25 MG tablet Take 50 mg by mouth at bedtime.      Marland Kitchen arformoterol (BROVANA) 15 MCG/2ML NEBU Take 15 mcg by nebulization 2 (two) times daily.      . busPIRone (BUSPAR) 10 MG tablet Take 10 mg by mouth 3 (three) times daily as needed. For anxiety      . CALCIUM-MAGNESIUM-ZINC PO Take 1 tablet by mouth every morning.       . cholecalciferol (VITAMIN D) 1000 UNITS tablet Take 1,000 Units by mouth every morning.       . fluticasone (FLONASE) 50 MCG/ACT nasal spray Place 2 sprays into the nose every morning.      . furosemide (LASIX) 20 MG tablet Take 20 mg by mouth every morning.       Marland Kitchen levothyroxine (SYNTHROID, LEVOTHROID) 88 MCG tablet Take 88 mcg by mouth daily before breakfast.       . lisinopril (PRINIVIL,ZESTRIL) 20 MG tablet Take 10 mg by mouth every morning.      Marland Kitchen omeprazole (PRILOSEC) 40 MG capsule Take 1 capsule (40 mg total) by mouth 2 (two) times daily.  60 capsule  2  . Potassium Gluconate 595 MG CAPS Take 1 capsule by mouth every morning.      . theophylline (THEO-24) 300 MG 24 hr capsule Take 300 mg by mouth 2 (two) times daily.      Marland Kitchen tiotropium (SPIRIVA) 18 MCG inhalation capsule Place 18 mcg into inhaler and inhale daily.      Marland Kitchen venlafaxine XR (EFFEXOR-XR) 150 MG 24 hr capsule Take 300 mg by mouth daily with breakfast.      .  ciprofloxacin (CIPRO) 500 MG tablet Take 1 tablet (500 mg total) by mouth 2 (two) times daily.  24 tablet  0  . metroNIDAZOLE (FLAGYL) 500 MG tablet Take 1 tablet (500 mg total) by mouth every 8 (eight) hours.  36 tablet  0     Objective: Blood pressure 116/70, pulse 72, temperature 99.1 F (37.3 C), temperature source Oral, resp. rate 20, height 5\' 7"  (1.702 m), weight 133 lb 4.8 oz (60.464 kg). Patient patient is alert and in no acute distress. She is on O2 via nasal cannula. Conjunctiva is pink. Sclera is nonicteric Oropharyngeal mucosa is normal. No neck masses or thyromegaly noted. Abdomen is symmetrical. It is soft and  nontender without organomegaly or masses. No LE edema or clubbing noted.  Labs/studies Results: Abdominopelvic CT reviewed along with the patient and her sister.  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 Other possibility would be ischemic colitis with stricture. However she never had any acute symptoms. I suspect pericolonic changes are secondary to colonic process and not originating in kidney. For diarrhea would appear to be secondary to antibiotic therapy which she will finish tomorrow. Would proceed with repeat CT with rectal contrast and determine next step.   Plan:  Abdominopelvic CT with IV and rectal contrast.

## 2012-07-24 ENCOUNTER — Other Ambulatory Visit (INDEPENDENT_AMBULATORY_CARE_PROVIDER_SITE_OTHER): Payer: Self-pay | Admitting: Internal Medicine

## 2012-07-24 DIAGNOSIS — K639 Disease of intestine, unspecified: Secondary | ICD-10-CM

## 2012-07-28 ENCOUNTER — Ambulatory Visit (HOSPITAL_COMMUNITY)
Admission: RE | Admit: 2012-07-28 | Discharge: 2012-07-28 | Disposition: A | Discharge status: Home / Self Care | Payer: Medicare Other | Source: Ambulatory Visit | Attending: Internal Medicine | Admitting: Internal Medicine

## 2012-07-28 ENCOUNTER — Encounter (INDEPENDENT_AMBULATORY_CARE_PROVIDER_SITE_OTHER): Payer: Self-pay

## 2012-07-28 DIAGNOSIS — K6389 Other specified diseases of intestine: Secondary | ICD-10-CM | POA: Insufficient documentation

## 2012-07-28 DIAGNOSIS — R935 Abnormal findings on diagnostic imaging of other abdominal regions, including retroperitoneum: Secondary | ICD-10-CM | POA: Insufficient documentation

## 2012-07-28 DIAGNOSIS — K639 Disease of intestine, unspecified: Secondary | ICD-10-CM

## 2012-07-28 MED ORDER — IOHEXOL 300 MG/ML  SOLN
100.0000 mL | Freq: Once | INTRAMUSCULAR | Status: AC | PRN
  Administered 2012-07-28: 100 mL via INTRAVENOUS

## 2012-07-30 NOTE — Progress Notes (Signed)
UR Chart Review Completed  

## 2012-09-13 ENCOUNTER — Encounter (HOSPITAL_COMMUNITY): Payer: Self-pay | Admitting: *Deleted

## 2012-09-13 ENCOUNTER — Emergency Department (HOSPITAL_COMMUNITY)
Admission: EM | Admit: 2012-09-13 | Discharge: 2012-09-13 | Disposition: A | Discharge status: Home / Self Care | Payer: Medicare Other | Attending: Emergency Medicine | Admitting: Emergency Medicine

## 2012-09-13 DIAGNOSIS — K219 Gastro-esophageal reflux disease without esophagitis: Secondary | ICD-10-CM | POA: Insufficient documentation

## 2012-09-13 DIAGNOSIS — S01501A Unspecified open wound of lip, initial encounter: Secondary | ICD-10-CM | POA: Insufficient documentation

## 2012-09-13 DIAGNOSIS — F411 Generalized anxiety disorder: Secondary | ICD-10-CM | POA: Insufficient documentation

## 2012-09-13 DIAGNOSIS — Z23 Encounter for immunization: Secondary | ICD-10-CM | POA: Insufficient documentation

## 2012-09-13 DIAGNOSIS — F17200 Nicotine dependence, unspecified, uncomplicated: Secondary | ICD-10-CM | POA: Insufficient documentation

## 2012-09-13 DIAGNOSIS — I10 Essential (primary) hypertension: Secondary | ICD-10-CM | POA: Insufficient documentation

## 2012-09-13 DIAGNOSIS — Z882 Allergy status to sulfonamides status: Secondary | ICD-10-CM | POA: Insufficient documentation

## 2012-09-13 DIAGNOSIS — Y92009 Unspecified place in unspecified non-institutional (private) residence as the place of occurrence of the external cause: Secondary | ICD-10-CM | POA: Insufficient documentation

## 2012-09-13 DIAGNOSIS — S01511A Laceration without foreign body of lip, initial encounter: Secondary | ICD-10-CM

## 2012-09-13 DIAGNOSIS — Z85118 Personal history of other malignant neoplasm of bronchus and lung: Secondary | ICD-10-CM | POA: Insufficient documentation

## 2012-09-13 DIAGNOSIS — W06XXXA Fall from bed, initial encounter: Secondary | ICD-10-CM | POA: Insufficient documentation

## 2012-09-13 DIAGNOSIS — Z9981 Dependence on supplemental oxygen: Secondary | ICD-10-CM | POA: Insufficient documentation

## 2012-09-13 MED ORDER — TETANUS-DIPHTH-ACELL PERTUSSIS 5-2.5-18.5 LF-MCG/0.5 IM SUSP
0.5000 mL | Freq: Once | INTRAMUSCULAR | Status: AC
  Administered 2012-09-13: 0.5 mL via INTRAMUSCULAR
  Filled 2012-09-13: qty 0.5

## 2012-09-13 MED ORDER — PENICILLIN V POTASSIUM 250 MG PO TABS
500.0000 mg | ORAL_TABLET | Freq: Once | ORAL | Status: AC
  Administered 2012-09-13: 500 mg via ORAL
  Filled 2012-09-13: qty 2

## 2012-09-13 MED ORDER — LIDOCAINE-EPINEPHRINE (PF) 1 %-1:200000 IJ SOLN
INTRAMUSCULAR | Status: AC
  Administered 2012-09-13: 10 mL
  Filled 2012-09-13: qty 10

## 2012-09-13 MED ORDER — DOUBLE ANTIBIOTIC 500-10000 UNIT/GM EX OINT
TOPICAL_OINTMENT | Freq: Once | CUTANEOUS | Status: AC
  Administered 2012-09-13: 1 via TOPICAL
  Filled 2012-09-13: qty 1

## 2012-09-13 MED ORDER — LIDOCAINE-EPINEPHRINE (PF) 1 %-1:200000 IJ SOLN
10.0000 mL | Freq: Once | INTRAMUSCULAR | Status: AC
  Administered 2012-09-13: 10 mL

## 2012-09-13 MED ORDER — PENICILLIN V POTASSIUM 500 MG PO TABS: 500.0000 mg | ORAL_TABLET | Freq: Four times a day (QID) | ORAL | Status: AC

## 2012-09-13 NOTE — ED Provider Notes (Signed)
Medical screening examination/treatment/procedure(s) were performed by non-physician practitioner and as supervising physician I was immediately available for consultation/collaboration.  Donnetta Hutching, MD 09/13/12 [phone]

## 2012-09-13 NOTE — ED Notes (Signed)
Pt states that she fell out of bed this morning, hit lip on nightstand, denies LOC

## 2012-09-13 NOTE — ED Provider Notes (Signed)
History     CSN: [id number]  Arrival date & time 09/13/12  1525   First MD Initiated Contact with Patient 09/13/12 1542      Chief Complaint  Patient presents with  . Lip Laceration    (Consider location/radiation/quality/duration/timing/severity/associated sxs/prior treatment) HPI Comments: Pt states she fell out of bed ~ 0600 today.  She was not aware that she had lacerated her upper lip.  ? Last dT.  No LOC.  Not taking anticoagulants.  Denies any other injuries.  The history is provided by the patient. No language interpreter was used.    Past Medical History  Diagnosis Date  . Aneurysm     brain  . GERD (gastroesophageal reflux disease)   . Hypertension   . Anxiety   . Oxygen dependent     requires oxygen for at least 16 hours per day,   . Hypothyroidism   . COPD (chronic obstructive pulmonary disease)     on home oxygen  . Anemia     iron deficiency anemia  . Gastric erosions     causing anemia  . Chronic respiratory failure   . Anemia   . Gastric ulcer   . Borderline diabetes   . Cancer   . Lung cancer     Past Surgical History  Procedure Date  . Lung removal, partial   . Abdominal hysterectomy   . Esophagogastroduodenoscopy 05/11/2012    Procedure: ESOPHAGOGASTRODUODENOSCOPY (EGD);  Surgeon: Malissa Hippo, MD;  Location: [facility];  Service: Endoscopy;  Laterality: N/A;  . Cataract extraction, bilateral     Family History  Problem Relation Age of Onset  . Hypertension Mother   . Hypertension Father   . Diabetes Mother   . Cancer Brother   . Diabetes Brother   . Diabetes Brother     History  Substance Use Topics  . Smoking status: Current Some Day Smoker -- 0.2 packs/day for 55 years  . Smokeless tobacco: Not on file  . Alcohol Use: No    OB History    Grav Para Term Preterm Abortions TAB SAB Ect Mult Living                  Review of Systems  Constitutional: Negative for fever and chills.  Skin: Positive for wound.    Neurological: Negative for weakness and numbness.  Hematological: Does not bruise/bleed easily.  All other systems reviewed and are negative.    Allergies  Sulfa antibiotics  Home Medications   Current Outpatient Rx  Name Route Sig Dispense Refill  . ACETAMINOPHEN 500 MG PO TABS Oral Take 1,000 mg by mouth every 4 (four) hours as needed. For pain    . ALBUTEROL SULFATE (2.5 MG/3ML) 0.083% IN NEBU Nebulization Take 2.5 mg by nebulization 2 (two) times daily.     Marland Kitchen AMITRIPTYLINE HCL 25 MG PO TABS Oral Take 50 mg by mouth at bedtime.    . ARFORMOTEROL TARTRATE 15 MCG/2ML IN NEBU Nebulization Take 15 mcg by nebulization 2 (two) times daily.    . BUSPIRONE HCL 10 MG PO TABS Oral Take 10 mg by mouth 3 (three) times daily as needed. For anxiety    . CALCIUM-MAGNESIUM-ZINC PO Oral Take 1 tablet by mouth every morning.     Marland Kitchen VITAMIN D 1000 UNITS PO TABS Oral Take 1,000 Units by mouth every morning.     Marland Kitchen FLUTICASONE PROPIONATE 50 MCG/ACT NA SUSP Nasal Place 2 sprays into the nose every morning.    Marland Kitchen  FUROSEMIDE 20 MG PO TABS Oral Take 20 mg by mouth every morning.     Marland Kitchen LEVOTHYROXINE SODIUM 88 MCG PO TABS Oral Take 88 mcg by mouth daily before breakfast.     . LISINOPRIL 20 MG PO TABS Oral Take 10 mg by mouth every morning.    Marland Kitchen OMEPRAZOLE 40 MG PO CPDR Oral Take 1 capsule (40 mg total) by mouth 2 (two) times daily. 60 capsule 2  . POTASSIUM GLUCONATE 595 MG PO CAPS Oral Take 1 capsule by mouth every morning.    . THEOPHYLLINE ER 300 MG PO CP24 Oral Take 300 mg by mouth 2 (two) times daily.    Marland Kitchen TIOTROPIUM BROMIDE MONOHYDRATE 18 MCG IN CAPS Inhalation Place 18 mcg into inhaler and inhale daily.    . VENLAFAXINE HCL ER 150 MG PO CP24 Oral Take 300 mg by mouth daily with breakfast.    . PENICILLIN V POTASSIUM 500 MG PO TABS Oral Take 1 tablet (500 mg total) by mouth 4 (four) times daily. 40 tablet 0    BP 140/87  Pulse 97  Temp 98.3 F (36.8 C) (Oral)  Resp 20  SpO2 100%  Physical Exam   Nursing note and vitals reviewed. Constitutional: She is oriented to person, place, and time. She appears well-developed and well-nourished. No distress.  HENT:  Head: Normocephalic and atraumatic.    Right Ear: Hearing, tympanic membrane, external ear and ear canal normal.  Left Ear: Hearing, tympanic membrane, external ear and ear canal normal.  Eyes: EOM and lids are normal. Pupils are equal, round, and reactive to light. Right conjunctiva is not injected. Right conjunctiva has no hemorrhage. Left conjunctiva is not injected. Left conjunctiva has no hemorrhage. Right eye exhibits normal extraocular motion and no nystagmus. Left eye exhibits normal extraocular motion and no nystagmus.  Neck: Trachea normal and normal range of motion. No spinous process tenderness and no muscular tenderness present. Normal range of motion present.  Cardiovascular: Normal rate, regular rhythm and normal heart sounds.   Pulmonary/Chest: Effort normal. No accessory muscle usage. Not tachypneic. No respiratory distress. She has rhonchi.       Copd and chronic bronchitis.  Abdominal: Soft. Normal appearance. She exhibits no distension. There is no tenderness.  Musculoskeletal: Normal range of motion.  Neurological: She is alert and oriented to person, place, and time. She has normal strength. No cranial nerve deficit or sensory deficit. Coordination normal. GCS eye subscore is 4. GCS verbal subscore is 5. GCS motor subscore is 6.  Skin: Skin is warm and dry.  Psychiatric: She has a normal mood and affect. Judgment normal.    ED Course  LACERATION REPAIR Date/Time: 09/13/2012 5:00 PM Performed by: Evalina Field Authorized by: Evalina Field Consent: Verbal consent obtained. Written consent not obtained. Risks and benefits: risks, benefits and alternatives were discussed Consent given by: patient Patient understanding: patient states understanding of the procedure being performed Patient consent: the  patient's understanding of the procedure matches consent given Site marked: the operative site was not marked Imaging studies: imaging studies not available Patient identity confirmed: verbally with patient Time out: Immediately prior to procedure a "time out" was called to verify the correct patient, procedure, equipment, support staff and site/side marked as required. Location: exterior upper lip. Laceration length: 3 cm Foreign bodies: no foreign bodies Tendon involvement: none Nerve involvement: none Vascular damage: no Anesthesia: local infiltration Local anesthetic: lidocaine 1% with epinephrine Anesthetic total: 2 ml Patient sedated: no Preparation: Patient was prepped and  draped in the usual sterile fashion. Irrigation solution: saline Irrigation method: syringe Amount of cleaning: extensive Debridement: none Degree of undermining: none Skin closure: 6-0 Prolene (7 sutures) Subcutaneous closure: 5-0 Vicryl (1 suture) Number of sutures: 8 Technique: simple Approximation: close Approximation difficulty: complex Dressing: antibiotic ointment Patient tolerance: Patient tolerated the procedure well with no immediate complications. Comments: Vermilion border well-aligned.   (including critical care time)  Labs Reviewed - No data to display No results found.   1. Lip laceration       MDM  rx-pen VK 500 mg QID x 10 days. Wash/abx oint BID Suture removal in 1 week.        Evalina Field, [place] 09/13/12 [phone]

## 2012-09-13 NOTE — Discharge Instructions (Signed)
Facial Laceration  A facial laceration is a cut on the face. Lacerations usually heal quickly, but they need special care to reduce scarring. It will take 1 to 2 years for the scar to lose its redness and to heal completely.  TREATMENT   Some facial lacerations may not require closure. Some lacerations may not be able to be closed due to an increased risk of infection. It is important to see your caregiver as soon as possible after an injury to minimize the risk of infection and to maximize the opportunity for successful closure.  If closure is appropriate, pain medicines may be given, if needed. The wound will be cleaned to help prevent infection. Your caregiver will use stitches (sutures), staples, wound glue (adhesive), or skin adhesive strips to repair the laceration. These tools bring the skin edges together to allow for faster healing and a better cosmetic outcome. However, all wounds will heal with a scar.   Once the wound has healed, scarring can be minimized by covering the wound with sunscreen during the day for 1 full year. Use a sunscreen with an SPF of at least 30. Sunscreen helps to reduce the pigment that will form in the scar. When applying sunscreen to a completely healed wound, massage the scar for a few minutes to help reduce the appearance of the scar. Use circular motions with your fingertips, on and around the scar. Do not massage a healing wound.  HOME CARE INSTRUCTIONS  For sutures:   Keep the wound clean and dry.   If you were given a bandage (dressing), you should change it at least once a day. Also change the dressing if it becomes wet or dirty, or as directed by your caregiver.   Wash the wound with soap and water 2 times a day. Rinse the wound off with water to remove all soap. Pat the wound dry with a clean towel.   After cleaning, apply a thin layer of the antibiotic ointment recommended by your caregiver. This will help prevent infection and keep the dressing from sticking.   You  may shower as usual after the first 24 hours. Do not soak the wound in water until the sutures are removed.   Only take over-the-counter or prescription medicines for pain, discomfort, or fever as directed by your caregiver.   Get your sutures removed as directed by your caregiver. With facial lacerations, sutures should usually be taken out after 4 to 5 days to avoid stitch marks.   Wait a few days after your sutures are removed before applying makeup.  For skin adhesive strips:   Keep the wound clean and dry.   Do not get the skin adhesive strips wet. You may bathe carefully, using caution to keep the wound dry.   If the wound gets wet, pat it dry with a clean towel.   Skin adhesive strips will fall off on their own. You may trim the strips as the wound heals. Do not remove skin adhesive strips that are still stuck to the wound. They will fall off in time.  For wound adhesive:   You may briefly wet your wound in the shower or bath. Do not soak or scrub the wound. Do not swim. Avoid periods of heavy perspiration until the skin adhesive has fallen off on its own. After showering or bathing, gently pat the wound dry with a clean towel.   Do not apply liquid medicine, cream medicine, ointment medicine, or makeup to your wound while the   wound, be careful not to apply tape directly over the skin adhesive. This may cause the adhesive to be pulled off before the wound is healed.  Avoid prolonged exposure to sunlight or tanning lamps while the skin adhesive is in place. Exposure to ultraviolet light in the first year will darken the scar.  The skin adhesive will usually remain in place for 5 to 10 days, then naturally fall off the skin. Do not pick at the adhesive film. You may need a tetanus shot if:  You cannot remember when you had your last tetanus shot.  You have never had a tetanus  shot. If you get a tetanus shot, your arm may swell, get red, and feel warm to the touch. This is common and not a problem. If you need a tetanus shot and you choose not to have one, there is a rare chance of getting tetanus. Sickness from tetanus can be serious. SEEK IMMEDIATE MEDICAL CARE IF:  You develop redness, pain, or swelling around the wound.  There is yellowish-white fluid (pus) coming from the wound.  You develop chills or a fever. MAKE SURE YOU:  Understand these instructions.  Will watch your condition.  Will get help right away if you are not doing well or get worse. Document Released: 12/26/2004 Document Revised: 02/10/2012 Document Reviewed: 05/13/2011 [facility] Patient Information 2013 [place], [place].   Wash wound twice daily with soap and water then apply antibiotic ointment.  See your MD for suture removal in 1 week.  Return to ED if any problems.

## 2012-10-29 ENCOUNTER — Emergency Department (HOSPITAL_COMMUNITY): Payer: Medicare Other

## 2012-10-29 ENCOUNTER — Encounter (HOSPITAL_COMMUNITY): Payer: Self-pay | Admitting: *Deleted

## 2012-10-29 ENCOUNTER — Inpatient Hospital Stay (HOSPITAL_COMMUNITY)
Admission: EM | Admit: 2012-10-29 | Discharge: 2012-11-08 | DRG: 193 | Disposition: A | Discharge status: Other Acute Inpatient Hospital | Payer: Medicare Other | Attending: Internal Medicine | Admitting: Internal Medicine

## 2012-10-29 DIAGNOSIS — Z982 Presence of cerebrospinal fluid drainage device: Secondary | ICD-10-CM

## 2012-10-29 DIAGNOSIS — R739 Hyperglycemia, unspecified: Secondary | ICD-10-CM

## 2012-10-29 DIAGNOSIS — R935 Abnormal findings on diagnostic imaging of other abdominal regions, including retroperitoneum: Secondary | ICD-10-CM | POA: Diagnosis present

## 2012-10-29 DIAGNOSIS — E872 Acidosis, unspecified: Secondary | ICD-10-CM | POA: Diagnosis present

## 2012-10-29 DIAGNOSIS — R0600 Dyspnea, unspecified: Secondary | ICD-10-CM

## 2012-10-29 DIAGNOSIS — D509 Iron deficiency anemia, unspecified: Secondary | ICD-10-CM | POA: Diagnosis present

## 2012-10-29 DIAGNOSIS — C3490 Malignant neoplasm of unspecified part of unspecified bronchus or lung: Secondary | ICD-10-CM

## 2012-10-29 DIAGNOSIS — J9620 Acute and chronic respiratory failure, unspecified whether with hypoxia or hypercapnia: Secondary | ICD-10-CM | POA: Diagnosis present

## 2012-10-29 DIAGNOSIS — Z9181 History of falling: Secondary | ICD-10-CM

## 2012-10-29 DIAGNOSIS — R7309 Other abnormal glucose: Secondary | ICD-10-CM

## 2012-10-29 DIAGNOSIS — IMO0002 Reserved for concepts with insufficient information to code with codable children: Secondary | ICD-10-CM

## 2012-10-29 DIAGNOSIS — E039 Hypothyroidism, unspecified: Secondary | ICD-10-CM | POA: Diagnosis present

## 2012-10-29 DIAGNOSIS — S3600XA Unspecified injury of spleen, initial encounter: Secondary | ICD-10-CM | POA: Diagnosis present

## 2012-10-29 DIAGNOSIS — Z79899 Other long term (current) drug therapy: Secondary | ICD-10-CM

## 2012-10-29 DIAGNOSIS — D72829 Elevated white blood cell count, unspecified: Secondary | ICD-10-CM | POA: Diagnosis present

## 2012-10-29 DIAGNOSIS — Z9221 Personal history of antineoplastic chemotherapy: Secondary | ICD-10-CM

## 2012-10-29 DIAGNOSIS — J189 Pneumonia, unspecified organism: Principal | ICD-10-CM | POA: Diagnosis present

## 2012-10-29 DIAGNOSIS — Z85118 Personal history of other malignant neoplasm of bronchus and lung: Secondary | ICD-10-CM

## 2012-10-29 DIAGNOSIS — F411 Generalized anxiety disorder: Secondary | ICD-10-CM | POA: Diagnosis present

## 2012-10-29 DIAGNOSIS — J441 Chronic obstructive pulmonary disease with (acute) exacerbation: Secondary | ICD-10-CM | POA: Diagnosis present

## 2012-10-29 DIAGNOSIS — Z8249 Family history of ischemic heart disease and other diseases of the circulatory system: Secondary | ICD-10-CM

## 2012-10-29 DIAGNOSIS — K651 Peritoneal abscess: Secondary | ICD-10-CM

## 2012-10-29 DIAGNOSIS — R634 Abnormal weight loss: Secondary | ICD-10-CM | POA: Diagnosis present

## 2012-10-29 DIAGNOSIS — K862 Cyst of pancreas: Secondary | ICD-10-CM | POA: Diagnosis present

## 2012-10-29 DIAGNOSIS — D62 Acute posthemorrhagic anemia: Secondary | ICD-10-CM | POA: Diagnosis present

## 2012-10-29 DIAGNOSIS — E871 Hypo-osmolality and hyponatremia: Secondary | ICD-10-CM | POA: Diagnosis present

## 2012-10-29 DIAGNOSIS — W19XXXA Unspecified fall, initial encounter: Secondary | ICD-10-CM | POA: Diagnosis present

## 2012-10-29 DIAGNOSIS — J90 Pleural effusion, not elsewhere classified: Secondary | ICD-10-CM | POA: Diagnosis present

## 2012-10-29 DIAGNOSIS — Z833 Family history of diabetes mellitus: Secondary | ICD-10-CM

## 2012-10-29 DIAGNOSIS — Z902 Acquired absence of lung [part of]: Secondary | ICD-10-CM

## 2012-10-29 DIAGNOSIS — I10 Essential (primary) hypertension: Secondary | ICD-10-CM | POA: Diagnosis present

## 2012-10-29 DIAGNOSIS — Z9981 Dependence on supplemental oxygen: Secondary | ICD-10-CM

## 2012-10-29 DIAGNOSIS — F17200 Nicotine dependence, unspecified, uncomplicated: Secondary | ICD-10-CM | POA: Diagnosis present

## 2012-10-29 DIAGNOSIS — Z9889 Other specified postprocedural states: Secondary | ICD-10-CM

## 2012-10-29 DIAGNOSIS — Z720 Tobacco use: Secondary | ICD-10-CM

## 2012-10-29 DIAGNOSIS — E43 Unspecified severe protein-calorie malnutrition: Secondary | ICD-10-CM | POA: Diagnosis present

## 2012-10-29 DIAGNOSIS — K219 Gastro-esophageal reflux disease without esophagitis: Secondary | ICD-10-CM | POA: Diagnosis present

## 2012-10-29 DIAGNOSIS — K863 Pseudocyst of pancreas: Secondary | ICD-10-CM | POA: Diagnosis present

## 2012-10-29 DIAGNOSIS — D638 Anemia in other chronic diseases classified elsewhere: Secondary | ICD-10-CM | POA: Diagnosis present

## 2012-10-29 DIAGNOSIS — D649 Anemia, unspecified: Secondary | ICD-10-CM

## 2012-10-29 HISTORY — DX: Presence of cerebrospinal fluid drainage device: Z98.2

## 2012-10-29 HISTORY — DX: Personal history of other diseases of the circulatory system: Z86.79

## 2012-10-29 HISTORY — DX: Other specified postprocedural states: Z98.890

## 2012-10-29 LAB — URINALYSIS, MICROSCOPIC ONLY
Hgb urine dipstick: NEGATIVE
Specific Gravity, Urine: 1.025 (ref 1.005–1.030)
Urobilinogen, UA: 0.2 mg/dL (ref 0.0–1.0)
pH: 5.5 (ref 5.0–8.0)

## 2012-10-29 LAB — CBC WITH DIFFERENTIAL/PLATELET
Basophils Absolute: 0 10*3/uL (ref 0.0–0.1)
Eosinophils Relative: 0 % (ref 0–5)
Lymphocytes Relative: 10 % — ABNORMAL LOW (ref 12–46)
Lymphs Abs: 1.2 10*3/uL (ref 0.7–4.0)
MCV: 77.8 fL — ABNORMAL LOW (ref 78.0–100.0)
Neutro Abs: 10.5 10*3/uL — ABNORMAL HIGH (ref 1.7–7.7)
Platelets: 476 10*3/uL — ABNORMAL HIGH (ref 150–400)
RBC: 3.78 MIL/uL — ABNORMAL LOW (ref 3.87–5.11)
RDW: 17.1 % — ABNORMAL HIGH (ref 11.5–15.5)
WBC: 12.6 10*3/uL — ABNORMAL HIGH (ref 4.0–10.5)

## 2012-10-29 LAB — HEPATIC FUNCTION PANEL
Bilirubin, Direct: 0.2 mg/dL (ref 0.0–0.3)
Indirect Bilirubin: 0.1 mg/dL — ABNORMAL LOW (ref 0.3–0.9)
Total Bilirubin: 0.3 mg/dL (ref 0.3–1.2)

## 2012-10-29 LAB — BLOOD GAS, ARTERIAL
Acid-Base Excess: 0.4 mmol/L (ref 0.0–2.0)
Bicarbonate: 25.1 mEq/L — ABNORMAL HIGH (ref 20.0–24.0)
Inspiratory PAP: 18
O2 Saturation: 99.6 %
TCO2: 23.8 mmol/L (ref 0–100)
pCO2 arterial: 45.2 mmHg — ABNORMAL HIGH (ref 35.0–45.0)
pO2, Arterial: 153 mmHg — ABNORMAL HIGH (ref 80.0–100.0)

## 2012-10-29 LAB — HEMOGLOBIN A1C
Hgb A1c MFr Bld: 6.6 % — ABNORMAL HIGH (ref <5.7)
Mean Plasma Glucose: 143 mg/dL — ABNORMAL HIGH (ref <117)

## 2012-10-29 LAB — BASIC METABOLIC PANEL
CO2: 27 mEq/L (ref 19–32)
Calcium: 9.4 mg/dL (ref 8.4–10.5)
Chloride: 85 mEq/L — ABNORMAL LOW (ref 96–112)
GFR calc Af Amer: >90 mL/min (ref >90)
Sodium: 124 mEq/L — ABNORMAL LOW (ref 135–145)

## 2012-10-29 LAB — GLUCOSE, CAPILLARY

## 2012-10-29 LAB — TROPONIN I: Troponin I: <0.3 ng/mL (ref <0.30)

## 2012-10-29 LAB — PROCALCITONIN: Procalcitonin: 1.67 ng/mL

## 2012-10-29 LAB — LACTIC ACID, PLASMA: Lactic Acid, Venous: 3.3 mmol/L — ABNORMAL HIGH (ref 0.5–2.2)

## 2012-10-29 MED ORDER — INSULIN ASPART 100 UNIT/ML ~~LOC~~ SOLN
0.0000 [IU] | Freq: Three times a day (TID) | SUBCUTANEOUS | Status: DC
  Administered 2012-10-29: 2 [IU] via SUBCUTANEOUS

## 2012-10-29 MED ORDER — IPRATROPIUM BROMIDE 0.02 % IN SOLN
0.5000 mg | Freq: Four times a day (QID) | RESPIRATORY_TRACT | Status: DC
  Administered 2012-10-29 – 2012-11-06 (×33): 0.5 mg via RESPIRATORY_TRACT
  Filled 2012-10-29 (×33): qty 2.5

## 2012-10-29 MED ORDER — ALBUTEROL SULFATE (5 MG/ML) 0.5% IN NEBU
5.0000 mg | INHALATION_SOLUTION | Freq: Once | RESPIRATORY_TRACT | Status: AC
  Administered 2012-10-29: 5 mg via RESPIRATORY_TRACT
  Filled 2012-10-29: qty 1

## 2012-10-29 MED ORDER — PANTOPRAZOLE SODIUM 40 MG IV SOLR
40.0000 mg | INTRAVENOUS | Status: DC
  Administered 2012-10-29: 40 mg via INTRAVENOUS
  Filled 2012-10-29: qty 40

## 2012-10-29 MED ORDER — LEVOFLOXACIN IN D5W 750 MG/150ML IV SOLN
750.0000 mg | Freq: Once | INTRAVENOUS | Status: AC
  Administered 2012-10-29: 750 mg via INTRAVENOUS
  Filled 2012-10-29: qty 150

## 2012-10-29 MED ORDER — METHYLPREDNISOLONE SODIUM SUCC 125 MG IJ SOLR
125.0000 mg | Freq: Once | INTRAMUSCULAR | Status: AC
  Administered 2012-10-29: 125 mg via INTRAVENOUS
  Filled 2012-10-29: qty 2

## 2012-10-29 MED ORDER — IPRATROPIUM BROMIDE 0.02 % IN SOLN
1.0000 mg | Freq: Once | RESPIRATORY_TRACT | Status: DC
  Filled 2012-10-29: qty 2.5

## 2012-10-29 MED ORDER — NICOTINE 21 MG/24HR TD PT24
21.0000 mg | MEDICATED_PATCH | Freq: Every day | TRANSDERMAL | Status: DC
  Administered 2012-10-29 – 2012-11-08 (×11): 21 mg via TRANSDERMAL
  Filled 2012-10-29 (×11): qty 1

## 2012-10-29 MED ORDER — IPRATROPIUM BROMIDE 0.02 % IN SOLN
0.5000 mg | Freq: Once | RESPIRATORY_TRACT | Status: AC
  Administered 2012-10-29: 0.5 mg via RESPIRATORY_TRACT
  Filled 2012-10-29: qty 2.5

## 2012-10-29 MED ORDER — ALBUTEROL SULFATE (5 MG/ML) 0.5% IN NEBU
5.0000 mg | INHALATION_SOLUTION | Freq: Once | RESPIRATORY_TRACT | Status: AC
  Administered 2012-10-29: 5 mg via RESPIRATORY_TRACT

## 2012-10-29 MED ORDER — FLUTICASONE PROPIONATE 50 MCG/ACT NA SUSP
2.0000 | NASAL | Status: DC
  Administered 2012-10-30 – 2012-11-08 (×10): 2 via NASAL
  Filled 2012-10-29: qty 16

## 2012-10-29 MED ORDER — LEVOTHYROXINE SODIUM 88 MCG PO TABS
88.0000 ug | ORAL_TABLET | Freq: Every day | ORAL | Status: DC
  Administered 2012-10-30 – 2012-11-08 (×10): 88 ug via ORAL
  Filled 2012-10-29 (×12): qty 1

## 2012-10-29 MED ORDER — ALBUTEROL SULFATE (5 MG/ML) 0.5% IN NEBU
15.0000 mg | INHALATION_SOLUTION | Freq: Once | RESPIRATORY_TRACT | Status: DC
  Filled 2012-10-29: qty 1
  Filled 2012-10-29: qty 3

## 2012-10-29 MED ORDER — ACETAMINOPHEN 325 MG PO TABS
650.0000 mg | ORAL_TABLET | Freq: Four times a day (QID) | ORAL | Status: DC | PRN
  Administered 2012-10-30 – 2012-11-08 (×18): 650 mg via ORAL
  Filled 2012-10-29 (×18): qty 2

## 2012-10-29 MED ORDER — ONDANSETRON HCL 4 MG PO TABS: 4.0000 mg | ORAL_TABLET | Freq: Four times a day (QID) | ORAL | Status: DC | PRN

## 2012-10-29 MED ORDER — MORPHINE SULFATE 2 MG/ML IJ SOLN: 1.0000 mg | INTRAMUSCULAR | Status: DC | PRN

## 2012-10-29 MED ORDER — METHYLPREDNISOLONE SODIUM SUCC 125 MG IJ SOLR
60.0000 mg | Freq: Two times a day (BID) | INTRAMUSCULAR | Status: DC
  Administered 2012-10-29 (×2): 60 mg via INTRAVENOUS
  Filled 2012-10-29 (×2): qty 2

## 2012-10-29 MED ORDER — ACETAMINOPHEN 650 MG RE SUPP: 650.0000 mg | Freq: Four times a day (QID) | RECTAL | Status: DC | PRN

## 2012-10-29 MED ORDER — ENOXAPARIN SODIUM 40 MG/0.4ML ~~LOC~~ SOLN
40.0000 mg | SUBCUTANEOUS | Status: DC
  Administered 2012-10-29: 40 mg via SUBCUTANEOUS
  Filled 2012-10-29: qty 0.4

## 2012-10-29 MED ORDER — THEOPHYLLINE ER 300 MG PO CP24
300.0000 mg | ORAL_CAPSULE | Freq: Two times a day (BID) | ORAL | Status: DC
  Administered 2012-10-29 – 2012-11-04 (×14): 300 mg via ORAL
  Filled 2012-10-29 (×17): qty 1

## 2012-10-29 MED ORDER — VENLAFAXINE HCL ER 75 MG PO CP24
300.0000 mg | ORAL_CAPSULE | Freq: Every day | ORAL | Status: DC
  Administered 2012-10-30 – 2012-11-08 (×10): 300 mg via ORAL
  Filled 2012-10-29 (×11): qty 4

## 2012-10-29 MED ORDER — ONDANSETRON HCL 4 MG/2ML IJ SOLN: 4.0000 mg | Freq: Four times a day (QID) | INTRAMUSCULAR | Status: DC | PRN

## 2012-10-29 MED ORDER — BIOTENE DRY MOUTH MT LIQD
15.0000 mL | Freq: Two times a day (BID) | OROMUCOSAL | Status: DC
  Administered 2012-10-29 – 2012-11-08 (×19): 15 mL via OROMUCOSAL

## 2012-10-29 MED ORDER — VANCOMYCIN HCL 1000 MG IV SOLR
750.0000 mg | Freq: Two times a day (BID) | INTRAVENOUS | Status: DC
  Administered 2012-10-29: 750 mg via INTRAVENOUS
  Filled 2012-10-29 (×2): qty 750

## 2012-10-29 MED ORDER — VANCOMYCIN HCL IN DEXTROSE 1-5 GM/200ML-% IV SOLN
1000.0000 mg | Freq: Once | INTRAVENOUS | Status: AC
  Administered 2012-10-29: 1000 mg via INTRAVENOUS
  Filled 2012-10-29: qty 200

## 2012-10-29 MED ORDER — DEXTROSE 5 % IV SOLN
1.0000 g | Freq: Once | INTRAVENOUS | Status: AC
  Administered 2012-10-29: 1 g via INTRAVENOUS
  Filled 2012-10-29: qty 10

## 2012-10-29 MED ORDER — SODIUM CHLORIDE 0.9 % IJ SOLN
3.0000 mL | Freq: Two times a day (BID) | INTRAMUSCULAR | Status: DC
  Administered 2012-10-29 – 2012-11-01 (×7): 3 mL via INTRAVENOUS
  Administered 2012-11-01: 10 mL via INTRAVENOUS
  Administered 2012-11-02 – 2012-11-08 (×13): 3 mL via INTRAVENOUS

## 2012-10-29 MED ORDER — ALBUTEROL SULFATE (5 MG/ML) 0.5% IN NEBU
2.5000 mg | INHALATION_SOLUTION | RESPIRATORY_TRACT | Status: DC
  Administered 2012-10-29 – 2012-10-30 (×6): 2.5 mg via RESPIRATORY_TRACT
  Filled 2012-10-29 (×6): qty 0.5

## 2012-10-29 MED ORDER — LEVOFLOXACIN IN D5W 750 MG/150ML IV SOLN
750.0000 mg | INTRAVENOUS | Status: DC
  Filled 2012-10-29: qty 150

## 2012-10-29 MED ORDER — SODIUM CHLORIDE 0.9 % IV SOLN
INTRAVENOUS | Status: DC
  Administered 2012-10-29 (×2): via INTRAVENOUS

## 2012-10-29 MED ORDER — BUSPIRONE HCL 5 MG PO TABS
10.0000 mg | ORAL_TABLET | Freq: Three times a day (TID) | ORAL | Status: DC | PRN
  Administered 2012-10-29 – 2012-11-08 (×8): 10 mg via ORAL
  Filled 2012-10-29: qty 2
  Filled 2012-10-29: qty 1
  Filled 2012-10-29 (×4): qty 2
  Filled 2012-10-29: qty 1
  Filled 2012-10-29 (×2): qty 2

## 2012-10-29 MED ORDER — IPRATROPIUM BROMIDE 0.02 % IN SOLN
0.5000 mg | Freq: Once | RESPIRATORY_TRACT | Status: AC
  Administered 2012-10-29: 0.5 mg via RESPIRATORY_TRACT

## 2012-10-29 NOTE — Progress Notes (Addendum)
ANTIBIOTIC CONSULT NOTE - INITIAL  Pharmacy Consult for Vancomycin  Indication: rule out pneumonia  Allergies  Allergen Reactions  . Sulfa Antibiotics Other (See Comments)    HANDS,FEET AND MOUTH BLISTERING    Patient Measurements:  Pt weight approximately 60 kg   Vital Signs: Temp: 97.3 F (36.3 C) (11/28 0849) Temp src: Axillary (11/28 0849) BP: 118/82 mmHg (11/28 0910) Pulse Rate: 113  (11/28 1011) Intake/Output from previous day:   Intake/Output from this shift:    Labs:  [facility] 10/29/12 0859  WBC 12.6*  HGB 9.6*  PLT 476*  LABCREA --  CREATININE 0.66   The CrCl is unknown because both a height and weight (above a minimum accepted value) are required for this calculation. No results found for this basename: VANCOTROUGH:2,VANCOPEAK:2,VANCORANDOM:2,GENTTROUGH:2,GENTPEAK:2,GENTRANDOM:2,TOBRATROUGH:2,TOBRAPEAK:2,TOBRARND:2,AMIKACINPEAK:2,AMIKACINTROU:2,AMIKACIN:2, in the last 72 hours   Microbiology: No results found for this or any previous visit (from the past 720 hour(s)).  Medical History: Past Medical History  Diagnosis Date  . Aneurysm     brain  . GERD (gastroesophageal reflux disease)   . Hypertension   . Anxiety   . Oxygen dependent     requires oxygen for at least 16 hours per day,   . Hypothyroidism   . COPD (chronic obstructive pulmonary disease)     on home oxygen  . Anemia     iron deficiency anemia  . Gastric erosions     causing anemia  . Chronic respiratory failure   . Anemia   . Gastric ulcer   . Borderline diabetes   . Cancer   . Lung cancer     Medications:  Scheduled:    . [COMPLETED] albuterol  5 mg Nebulization Once  . [COMPLETED] albuterol  5 mg Nebulization Once  . [COMPLETED] albuterol  5 mg Nebulization Once  . [COMPLETED] ipratropium  0.5 mg Nebulization Once  . [COMPLETED] ipratropium  0.5 mg Nebulization Once  . [COMPLETED] ipratropium  0.5 mg Nebulization Once  . [COMPLETED] methylPREDNISolone (SOLU-MEDROL)  injection  125 mg Intravenous Once  . [DISCONTINUED] albuterol  15 mg Nebulization Once  . [DISCONTINUED] ipratropium  1 mg Nebulization Once   Assessment: Suspected pneumonia SCr 0.66 Calculated CrCl approximately 65 ml/min  Goal of Therapy:  Vancomycin trough level 15-20 mcg/ml  Plan:  Vancomycin 1 GM IV loading dose, then 750 mg IV every 12 hours Vancomycin trough at steady state Monitor renal function Labs per protocol  Raquel James, Clarinda Obi Bennett 10/29/2012,10:27 AM

## 2012-10-29 NOTE — ED Provider Notes (Signed)
History     CSN: [id number]  Arrival date & time 10/29/12  0846   First MD Initiated Contact with Patient 10/29/12 [phone]      Chief Complaint  Patient presents with  . Shortness of Breath     Patient is a 66 y.o. female presenting with shortness of breath. The history is provided by the EMS personnel and the patient. The history is limited by the condition of the patient (urgent need for intervention).  Shortness of Breath  Associated symptoms include shortness of breath.  Pt was seen at 0845.  Pt seen on arrival to ED exam room.  Per pt and EMS, pt with c/o gradual onset and worsening of constant SOB that began earlier this morning PTA.  Pt did not use her MDI or neb.  Upon EMS arrival to scene, pt was wearing her home O2 N/C 2L, with Sats 98-100%.  EMS gave neb and started Bipap en route with minimal relief.  Pt with significant hx COPD, on home O2 N/C, continues to smoke cigarettes.      Past Medical History  Diagnosis Date  . Aneurysm     brain  . GERD (gastroesophageal reflux disease)   . Hypertension   . Anxiety   . Oxygen dependent     requires oxygen for at least 16 hours per day,   . Hypothyroidism   . COPD (chronic obstructive pulmonary disease)     on home oxygen  . Anemia     iron deficiency anemia  . Gastric erosions     causing anemia  . Chronic respiratory failure   . Anemia   . Gastric ulcer   . Borderline diabetes   . Cancer   . Lung cancer     Past Surgical History  Procedure Date  . Lung removal, partial   . Abdominal hysterectomy   . Esophagogastroduodenoscopy 05/11/2012    Procedure: ESOPHAGOGASTRODUODENOSCOPY (EGD);  Surgeon: Malissa Hippo, MD;  Location: [facility];  Service: Endoscopy;  Laterality: N/A;  . Cataract extraction, bilateral     Family History  Problem Relation Age of Onset  . Hypertension Mother   . Hypertension Father   . Diabetes Mother   . Cancer Brother   . Diabetes Brother   . Diabetes Brother     History    Substance Use Topics  . Smoking status: Current Every Day Smoker -- 0.2 packs/day for 55 years  . Smokeless tobacco: Not on file  . Alcohol Use: No    Review of Systems  Unable to perform ROS: Unstable vital signs  Respiratory: Positive for shortness of breath.     Allergies  Sulfa antibiotics  Home Medications   Current Outpatient Rx  Name  Route  Sig  Dispense  Refill  . ACETAMINOPHEN 500 MG PO TABS   Oral   Take 1,000 mg by mouth every 4 (four) hours as needed. For pain         . ALBUTEROL SULFATE (2.5 MG/3ML) 0.083% IN NEBU   Nebulization   Take 2.5 mg by nebulization 2 (two) times daily.          Marland Kitchen AMITRIPTYLINE HCL 25 MG PO TABS   Oral   Take 50 mg by mouth at bedtime.         . ARFORMOTEROL TARTRATE 15 MCG/2ML IN NEBU   Nebulization   Take 15 mcg by nebulization 2 (two) times daily.         . BUSPIRONE HCL 10  MG PO TABS   Oral   Take 10 mg by mouth 3 (three) times daily as needed. For anxiety         . CALCIUM-MAGNESIUM-ZINC PO   Oral   Take 1 tablet by mouth every morning.          Marland Kitchen VITAMIN D 1000 UNITS PO TABS   Oral   Take 1,000 Units by mouth every morning.          Marland Kitchen FLUTICASONE PROPIONATE 50 MCG/ACT NA SUSP   Nasal   Place 2 sprays into the nose every morning.         . FUROSEMIDE 20 MG PO TABS   Oral   Take 20 mg by mouth every morning.          Marland Kitchen LEVOTHYROXINE SODIUM 88 MCG PO TABS   Oral   Take 88 mcg by mouth daily before breakfast.          . LISINOPRIL 20 MG PO TABS   Oral   Take 10 mg by mouth every morning.         Marland Kitchen OMEPRAZOLE 40 MG PO CPDR   Oral   Take 1 capsule (40 mg total) by mouth 2 (two) times daily.   60 capsule   2   . POTASSIUM GLUCONATE 595 MG PO CAPS   Oral   Take 1 capsule by mouth every morning.         . THEOPHYLLINE ER 300 MG PO CP24   Oral   Take 300 mg by mouth 2 (two) times daily.         Marland Kitchen TIOTROPIUM BROMIDE MONOHYDRATE 18 MCG IN CAPS   Inhalation   Place 18 mcg into  inhaler and inhale daily.         . VENLAFAXINE HCL ER 150 MG PO CP24   Oral   Take 300 mg by mouth daily with breakfast.           BP 118/82  Pulse 117  Temp 97.3 F (36.3 C) (Axillary)  Resp 24  SpO2 100%  Physical Exam 0850: Physical examination:  Nursing notes reviewed; Vital signs and O2 SAT reviewed;  Constitutional: Thin, frail, In no acute distress; Head:  Normocephalic, atraumatic; Eyes: EOMI, PERRL, No scleral icterus; ENMT: Mouth and pharynx normal, Mucous membranes dry; Neck: Supple, Full range of motion, No lymphadenopathy; Cardiovascular: Regular rate and rhythm, No murmur, rub, or gallop; Respiratory: Breath sounds diminished, coarse & equal bilaterally, insp/exp wheezes bilat. Speaking in words, sitting upright. +access mm use.; Chest: Nontender, Movement normal; Abdomen: Soft, Nontender, Nondistended, Normal bowel sounds;; Extremities: Pulses normal, No tenderness, No edema, No calf edema or asymmetry.; Neuro: AA&Ox3, Major CN grossly intact.  Speech clear. No gross focal motor or sensory deficits in extremities.; Skin: Color normal, Warm, Dry.   ED Course  Procedures   [phone]:  Pt arrived to ED exam room on Bipap.  Pt able to speak in few words at a time, sitting upright.  Sats 100% on Bipap, afebrile, VS otherwise stable.  Will dose continuous neb, dose IV solumedrol, and continue bipap.  0915:  Pt on neb and bipap.  Sats continue 100%.  Pt awake, talking in few words to family at bedside.    1000:  Nebs completed.  IV solumedrol given. +infiltrates on CXR, will tx for CAP.  Pt is full code, and will likely need ICU bed, so will start abx after BC and UC, and draw lactic acid.  Dx  and testing d/w pt and family.  Questions answered.  Verb understanding, agreeable to admit.  1100:  Pt laying semi-fowler's, appears more comfortable.  Lungs diminished and coarse bilat, Sats remain 100% on bipap.  Now speaking in short sentences with her spouse at bedside. ABG pending.   T/C to [facility]. Lendell Caprice, case discussed, including:  HPI, pertinent PM/SHx, VS/PE, dx testing, ED course and treatment:  Agreeable to admit, requests she will come to the ED for eval.       MDM  MDM Reviewed: nursing note, vitals and previous chart Reviewed previous: ECG and labs Interpretation: labs and x-ray Total time providing critical care: 30-74 minutes. This excludes time spent performing separately reportable procedures and services. Consults: admitting MD     CRITICAL CARE Performed by: Laray Anger Total critical care time: 70 Critical care time was exclusive of separately billable procedures and treating other patients. Critical care was necessary to treat or prevent imminent or life-threatening deterioration. Critical care was time spent personally by me on the following activities: development of treatment plan with patient and/or surrogate as well as nursing, discussions with consultants, evaluation of patient's response to treatment, examination of patient, obtaining history from patient or surrogate, ordering and performing treatments and interventions, ordering and review of laboratory studies, ordering and review of radiographic studies, pulse oximetry and re-evaluation of patient's condition.    Date: 10/29/2012  Rate: 125  Rhythm: sinus tachycardia  QRS Axis: normal  Intervals: normal  ST/T Wave abnormalities: normal  Conduction Disutrbances:none  Narrative Interpretation:   Old EKG Reviewed: unchanged; no significant changes from previous EKG dated 05/08/2012.   Results for orders placed during the hospital encounter of 10/29/12  BASIC METABOLIC PANEL      Component Value Range   Sodium 124 (*) 135 - 145 mEq/L   Potassium 4.2  3.5 - 5.1 mEq/L   Chloride 85 (*) 96 - 112 mEq/L   CO2 27  19 - 32 mEq/L   Glucose, Bld 212 (*) 70 - 99 mg/dL   BUN 12  6 - 23 mg/dL   Creatinine, Ser 1.61  0.50 - 1.10 mg/dL   Calcium 9.4  8.4 - 09.6 mg/dL   GFR calc non Af  Amer 89 (*) >90 mL/min   GFR calc Af Amer >90  >90 mL/min  CBC WITH DIFFERENTIAL      Component Value Range   WBC 12.6 (*) 4.0 - 10.5 K/uL   RBC 3.78 (*) 3.87 - 5.11 MIL/uL   Hemoglobin 9.6 (*) 12.0 - 15.0 g/dL   HCT 04.5 (*) 40.9 - 81.1 %   MCV 77.8 (*) 78.0 - 100.0 fL   MCH 25.4 (*) 26.0 - 34.0 pg   MCHC 32.7  30.0 - 36.0 g/dL   RDW 91.4 (*) 78.2 - 95.6 %   Platelets 476 (*) 150 - 400 K/uL   Neutrophils Relative 84 (*) 43 - 77 %   Neutro Abs 10.5 (*) 1.7 - 7.7 K/uL   Lymphocytes Relative 10 (*) 12 - 46 %   Lymphs Abs 1.2  0.7 - 4.0 K/uL   Monocytes Relative 7  3 - 12 %   Monocytes Absolute 0.8  0.1 - 1.0 K/uL   Eosinophils Relative 0  0 - 5 %   Eosinophils Absolute 0.0  0.0 - 0.7 K/uL   Basophils Relative 0  0 - 1 %   Basophils Absolute 0.0  0.0 - 0.1 K/uL  TROPONIN I      Component  Value Range   Troponin I <0.30  <0.30 ng/mL   Dg Chest Port 1 View 10/29/2012  *RADIOLOGY REPORT*  Clinical Data: Cough, CHF, infiltrate  PORTABLE CHEST - 1 VIEW  Comparison: 04/22/2012; chest CT - 04/22/2012  Findings:  Unchanged enlarged cardiac silhouette and mediastinal contours. Atherosclerotic calcifications within the thoracic aorta.  Stable postsurgical change of the right lung and right hemithorax.  Left basilar heterogeneous air space opacities.  No focal right-sided airspace opacities.  Grossly unchanged nodular, slightly spiculated opacity overlying the peripheral aspect of the right lung corresponding to the nodule seen on recent chest CT.  Grossly unchanged bones including extensive postsurgical change of the superior aspect of the right thorax.  Ventriculoperitoneal tubing courses along the midline of the left hemithorax.  IMPRESSION: 1.  Left basilar heterogeneous possible air space opacities worrisome for infection. A follow-up chest radiograph in 4 to 6 weeks after treatment is recommended to ensure resolution. 2.  Grossly unchanged nodular opacity overlying the peripheral aspect of the  right lower lung as demonstrated on recent chest CT. This nodule again remains concerning for malignancy.   Original Report Authenticated By: Tacey Ruiz, MD     Results for CORELLA, DEMCHAK (MRN [id number]) as of 10/29/2012 10:24  Ref. Range 05/11/2012 04:34 07/10/2012 16:08 07/11/2012 05:42 10/29/2012 08:59  Sodium Latest Range: 135-145 mEq/L 136 134 (L) 138 124 (L)  Chloride Latest Range: 96-112 mEq/L 96 95 (L) 99 85 (L)    Results for BRINIYAH, VALIDO (MRN [id number]) as of 10/29/2012 10:26  Ref. Range 05/12/2012 05:22 05/26/2012 11:30 07/10/2012 16:08 07/11/2012 05:42 10/29/2012 08:59  Hemoglobin Latest Range: 12.0-15.0 g/dL 78.2 (L) 95.6 21.3 (L) 10.2 (L) 9.6 (L)  HCT Latest Range: 36.0-46.0 % 33.2 (L) 37.8 34.3 (L) 33.3 (L) 29.4 (L)      Laray Anger, DO 11/01/12 2342

## 2012-10-29 NOTE — H&P (Addendum)
Hospital Admission Note Date: 10/29/2012  Patient name: Tracey Martinez Medical record number: [id number] Date of birth: 03-08-1946 Age: 66 y.o. Gender: female PCP: Ninfa Linden, FNP  Attending physician: Christiane Ha, MD  Chief Complaint: Shortness of breath  History of Present Illness:  Tracey Martinez is an 66 y.o. female who presents to the ER but PE MS with shortness of breath. She has a history of previous lung cancer, COPD, chronic respiratory failure requiring home oxygen. She is unable to provide any history as she is on BiPAP. Her husband provides some of the history. Also, she is a spiculated mass seen on CAT scan of the chest from May of 2013. She is followed by an oncologist at [facility]. She has had abnormal colonoscopy concerning for cancer, but biopsy showed benign tissue. She also had an abnormal CT of the abdomen and pelvis which is being worked up at [facility]. Apparently she has an MRI which has been rescheduled for next month. The husband reports that she was doing fine last night and then woke up very short of breath this morning. Requesting 911 be called. Her oxygen saturations were reportedly normal, but she was put on BiPAP for respiratory distress and labored breathing. In the emergency room, white blood cell count is over 12,000. Chest x-ray shows left basilar opacity concerning for pneumonia. Right sided nodular opacity unchanged from previous, but concerning for malignancy. The husband reports that she may have lost weight recently, but cannot quantify further. He reports that she continues to smoke, but unable to quantify quantity. He reports that she has a poor appetite. He does not know whether she has had a cough, fevers or chills.  Past Medical History  Diagnosis Date  . Aneurysm     brain  . GERD (gastroesophageal reflux disease)   . Hypertension   . Anxiety   . Oxygen dependent     requires oxygen for at least 16 hours per day,   . Hypothyroidism   . COPD  (chronic obstructive pulmonary disease)     on home oxygen  . Anemia     iron deficiency anemia  . Gastric erosions     causing anemia  . Chronic respiratory failure   . Anemia   . Gastric ulcer   . Borderline diabetes   . Cancer   . Lung cancer     Meds: See medicine reconciliation  Allergies: Sulfa antibiotics History   Social History  . Marital Status: Married    Spouse Name: N/A    Number of Children: N/A  . Years of Education: N/A   Occupational History  . Service advisor for [employer]    Social History Main Topics  . Smoking status: Current Every Day Smoker -- 0.2 packs/day for 55 years  . Smokeless tobacco: Not on file  . Alcohol Use: No  . Drug Use: No  . Sexually Active: Yes    Birth Control/ Protection: Surgical   Other Topics Concern  . Not on file   Social History Narrative  . No narrative on file   Family History  Problem Relation Age of Onset  . Hypertension Mother   . Hypertension Father   . Diabetes Mother   . Cancer Brother   . Diabetes Brother   . Diabetes Brother    Past Surgical History  Procedure Date  . Lung removal, partial   . Abdominal hysterectomy   . Esophagogastroduodenoscopy 05/11/2012    Procedure: ESOPHAGOGASTRODUODENOSCOPY (EGD);  Surgeon: Joline Maxcy  Karilyn Cota, MD;  Location: [facility];  Service: Endoscopy;  Laterality: N/A;  . Cataract extraction, bilateral     Review of Systems: Systems reviewed and as per HPI, otherwise negative.  Physical Exam: Blood pressure 107/73, pulse 72, temperature 96.4 F (35.8 C), temperature source Axillary, resp. rate 24, SpO2 97.00%.  BP 107/73  Pulse 72  Temp 96.4 F (35.8 C) (Axillary)  Resp 24  SpO2 97%  General Appearance:    Eyes closed on BiPAP. Answers questions, but difficult to understand due to BiPAP. Follows simple commands. Cachectic appearing   Head:    Normocephalic, without obvious abnormality, atraumatic  Eyes:    PERRL, conjunctiva/corneas clear, EOM's  intact, fundi    benign, both eyes  Ears:    Normal TM's and external ear canals, both ears  Nose:   Nares normal, septum midline, mucosa normal, no drainage    or sinus tenderness  Throat:   Lips, mucosa, and tongue normal; teeth and gums normal  Neck:   Supple, symmetrical, trachea midline, no adenopathy;    thyroid:  no enlargement/tenderness/nodules; no carotid   bruit or JVD  Back:     Symmetric, no curvature, ROM normal, no CVA tenderness  Lungs:     Tachypneic, bilateral rhonchi and wheeze. Prolonged expiratory phase.  Chest Wall:    No tenderness or deformity   Heart:    Regular rate and rhythm, S1 and S2 normal, no murmur, rub   or gallop  Breast Exam:    deferred   Abdomen:     Soft, non-tender, bowel sounds active all four quadrants,    no masses, no organomegaly  Genitalia:   deferred   Rectal:   deferred   Extremities:   Extremities normal, atraumatic, no cyanosis or edema  Pulses:   2+ and symmetric all extremities  Skin:   Skin color, texture, turgor normal, no rashes or lesions  Lymph nodes:   Cervical, supraclavicular, and axillary nodes normal  Neurologic:   CNII-XII intact, normal strength, sensation and reflexes    throughout    Lab results: Basic Metabolic Panel:  Basename 10/29/12 0859  NA 124*  K 4.2  CL 85*  CO2 27  GLUCOSE 212*  BUN 12  CREATININE 0.66  CALCIUM 9.4  MG --  PHOS --   Liver Function Tests: No results found for this basename: AST:2,ALT:2,ALKPHOS:2,BILITOT:2,PROT:2,ALBUMIN:2 in the last 72 hours No results found for this basename: LIPASE:2,AMYLASE:2 in the last 72 hours No results found for this basename: AMMONIA:2 in the last 72 hours CBC:  Basename 10/29/12 0859  WBC 12.6*  NEUTROABS 10.5*  HGB 9.6*  HCT 29.4*  MCV 77.8*  PLT 476*   Cardiac Enzymes:  Basename 10/29/12 0859  CKTOTAL --  CKMB --  CKMBINDEX --  TROPONINI <0.30  Urinalysis:  Basename 10/29/12 1130  COLORURINE AMBER*  LABSPEC 1.025  PHURINE 5.5    GLUCOSEU NEGATIVE  HGBUR NEGATIVE  BILIRUBINUR SMALL*  KETONESUR NEGATIVE  PROTEINUR 30*  UROBILINOGEN 0.2  NITRITE NEGATIVE  LEUKOCYTESUR NEGATIVE   Imaging results:  Dg Chest Port 1 View  10/29/2012  *RADIOLOGY REPORT*  Clinical Data: Cough, CHF, infiltrate  PORTABLE CHEST - 1 VIEW  Comparison: 04/22/2012; chest CT - 04/22/2012  Findings:  Unchanged enlarged cardiac silhouette and mediastinal contours. Atherosclerotic calcifications within the thoracic aorta.  Stable postsurgical change of the right lung and right hemithorax.  Left basilar heterogeneous air space opacities.  No focal right-sided airspace opacities.  Grossly unchanged nodular, slightly spiculated  opacity overlying the peripheral aspect of the right lung corresponding to the nodule seen on recent chest CT.  Grossly unchanged bones including extensive postsurgical change of the superior aspect of the right thorax.  Ventriculoperitoneal tubing courses along the midline of the left hemithorax.  IMPRESSION: 1.  Left basilar heterogeneous possible air space opacities worrisome for infection. A follow-up chest radiograph in 4 to 6 weeks after treatment is recommended to ensure resolution. 2.  Grossly unchanged nodular opacity overlying the peripheral aspect of the right lower lung as demonstrated on recent chest CT. This nodule again remains concerning for malignancy.   Original Report Authenticated By: Tacey Ruiz, MD    EKG shows sinus tachycardia and possible left atrial enlargement  Assessment & Plan: Principal Problem:  *Acute-on-chronic respiratory failure: ABG is pending. Continue BiPAP. Patient will be admitted to step down. She reports full CODE STATUS. Continue steroids, bronchodilators, antibiotics, theophylline. Check theophylline level. Active Problems:  CAP (community acquired pneumonia): Continue levofloxacin. Check urine for Legionella and Streptococcus pneumonia, HIV  COPD exacerbation: See above. Needs to quit  smoking.  HTN (hypertension): Hold antihypertensives  Lactic acidosis secondary to above  Hyponatremia: Will check TSH. Give saline. Monitor.  Hyperglycemia: History of "borderline diabetes". We'll give sliding scale and check hemoglobin A1c  Tobacco abuse  History of lung cancer: May require repeat CT scan to further evaluate previously noted lung nodule.  Hypothyroidism Critical care time 40 minutes  Collis Thede L 10/29/2012, 12:08 PM

## 2012-10-29 NOTE — ED Notes (Signed)
Pt has had 2 episodes of loose stool.  Reports taking miralax this morning for constipation.

## 2012-10-29 NOTE — ED Notes (Signed)
Increased sob that started around 0500 this morning.  EMS gave 2 neb tx en route with minimal relief.  Cpap in place on arrival.  EMS reports 98-100% on 2L O2 upon EMS arrival to home.

## 2012-10-30 ENCOUNTER — Inpatient Hospital Stay (HOSPITAL_COMMUNITY): Payer: Medicare Other

## 2012-10-30 LAB — BASIC METABOLIC PANEL
BUN: 24 mg/dL — ABNORMAL HIGH (ref 6–23)
CO2: 26 mEq/L (ref 19–32)
Chloride: 87 mEq/L — ABNORMAL LOW (ref 96–112)
GFR calc non Af Amer: 60 mL/min — ABNORMAL LOW (ref >90)
Glucose, Bld: 129 mg/dL — ABNORMAL HIGH (ref 70–99)
Potassium: 4.5 mEq/L (ref 3.5–5.1)
Sodium: 125 mEq/L — ABNORMAL LOW (ref 135–145)

## 2012-10-30 LAB — URINE CULTURE

## 2012-10-30 LAB — OSMOLALITY, URINE: Osmolality, Ur: 516 mOsm/kg (ref 390–1090)

## 2012-10-30 LAB — LEGIONELLA ANTIGEN, URINE: Legionella Antigen, Urine: NEGATIVE

## 2012-10-30 LAB — CBC
Hemoglobin: 8 g/dL — ABNORMAL LOW (ref 12.0–15.0)
MCH: 25.7 pg — ABNORMAL LOW (ref 26.0–34.0)
Platelets: 500 10*3/uL — ABNORMAL HIGH (ref 150–400)
RBC: 3.11 MIL/uL — ABNORMAL LOW (ref 3.87–5.11)
WBC: 21.4 10*3/uL — ABNORMAL HIGH (ref 4.0–10.5)

## 2012-10-30 LAB — GLUCOSE, CAPILLARY: Glucose-Capillary: 120 mg/dL — ABNORMAL HIGH (ref 70–99)

## 2012-10-30 LAB — OSMOLALITY: Osmolality: 272 mOsm/kg — ABNORMAL LOW (ref 275–300)

## 2012-10-30 MED ORDER — ALBUTEROL SULFATE (5 MG/ML) 0.5% IN NEBU: 2.5000 mg | INHALATION_SOLUTION | RESPIRATORY_TRACT | Status: DC | PRN

## 2012-10-30 MED ORDER — ENSURE PUDDING PO PUDG
1.0000 | Freq: Three times a day (TID) | ORAL | Status: DC
  Administered 2012-10-30 – 2012-11-07 (×19): 1 via ORAL

## 2012-10-30 MED ORDER — PANTOPRAZOLE SODIUM 40 MG PO TBEC
40.0000 mg | DELAYED_RELEASE_TABLET | Freq: Every day | ORAL | Status: DC
  Administered 2012-10-30 – 2012-11-08 (×10): 40 mg via ORAL
  Filled 2012-10-30 (×10): qty 1

## 2012-10-30 MED ORDER — ARFORMOTEROL TARTRATE 15 MCG/2ML IN NEBU
15.0000 ug | INHALATION_SOLUTION | Freq: Two times a day (BID) | RESPIRATORY_TRACT | Status: DC
  Administered 2012-10-30 – 2012-11-05 (×12): 15 ug via RESPIRATORY_TRACT
  Filled 2012-10-30 (×25): qty 2

## 2012-10-30 MED ORDER — LEVOFLOXACIN 750 MG PO TABS
750.0000 mg | ORAL_TABLET | Freq: Every day | ORAL | Status: DC
  Administered 2012-10-30 – 2012-11-02 (×4): 750 mg via ORAL
  Filled 2012-10-30 (×4): qty 1

## 2012-10-30 MED ORDER — ALBUTEROL SULFATE (5 MG/ML) 0.5% IN NEBU
2.5000 mg | INHALATION_SOLUTION | Freq: Four times a day (QID) | RESPIRATORY_TRACT | Status: DC
  Administered 2012-10-30 – 2012-11-06 (×29): 2.5 mg via RESPIRATORY_TRACT
  Filled 2012-10-30 (×30): qty 0.5

## 2012-10-30 MED ORDER — PREDNISONE 20 MG PO TABS
40.0000 mg | ORAL_TABLET | Freq: Two times a day (BID) | ORAL | Status: DC
  Administered 2012-10-30 – 2012-10-31 (×3): 40 mg via ORAL
  Filled 2012-10-30 (×3): qty 2

## 2012-10-30 MED ORDER — IOHEXOL 300 MG/ML  SOLN
80.0000 mL | Freq: Once | INTRAMUSCULAR | Status: AC | PRN
  Administered 2012-10-30: 80 mL via INTRAVENOUS

## 2012-10-30 NOTE — Progress Notes (Signed)
INITIAL ADULT NUTRITION ASSESSMENT Date: 10/30/2012   Time: 12:09 PM Reason for Assessment: Malnutrition Screen  ASSESSMENT: Female 66 y.o.  Dx: Acute-on-chronic respiratory failure   Past Medical History  Diagnosis Date  . Aneurysm     brain  . GERD (gastroesophageal reflux disease)   . Hypertension   . Anxiety   . Oxygen dependent     requires oxygen for at least 16 hours per day,   . Hypothyroidism   . COPD (chronic obstructive pulmonary disease)     on home oxygen  . Anemia     iron deficiency anemia  . Gastric erosions     causing anemia  . Chronic respiratory failure   . Anemia   . Gastric ulcer   . Borderline diabetes   . Cancer   . Lung cancer     Scheduled Meds:   . albuterol  2.5 mg Nebulization QID  . antiseptic oral rinse  15 mL Mouth Rinse BID  . arformoterol  15 mcg Nebulization BID  . feeding supplement  1 Container Oral TID BM  . fluticasone  2 spray Each Nare [id number]  . ipratropium  0.5 mg Nebulization QID  . [COMPLETED] levofloxacin (LEVAQUIN) IV  750 mg Intravenous Once  . levofloxacin  750 mg Oral Daily  . levothyroxine  88 mcg Oral QAC breakfast  . nicotine  21 mg Transdermal Daily  . pantoprazole  40 mg Oral Daily  . predniSONE  40 mg Oral BID  . sodium chloride  3 mL Intravenous Q12H  . theophylline  300 mg Oral BID  . [COMPLETED] vancomycin  1,000 mg Intravenous Once  . venlafaxine XR  300 mg Oral Q breakfast  . [DISCONTINUED] albuterol  2.5 mg Nebulization Q4H  . [DISCONTINUED] enoxaparin (LOVENOX) injection  40 mg Subcutaneous Q24H  . [DISCONTINUED] insulin aspart  0-9 Units Subcutaneous TID WC  . [DISCONTINUED] levofloxacin (LEVAQUIN) IV  750 mg Intravenous Q24H  . [DISCONTINUED] methylPREDNISolone (SOLU-MEDROL) injection  60 mg Intravenous Q12H  . [DISCONTINUED] pantoprazole (PROTONIX) IV  40 mg Intravenous Q24H  . [DISCONTINUED] vancomycin  750 mg Intravenous Q12H   Continuous Infusions:   . [DISCONTINUED] sodium chloride 75  mL/hr at 10/29/12 2201   PRN Meds:.acetaminophen, albuterol, busPIRone, [COMPLETED] iohexol, morphine injection, ondansetron (ZOFRAN) IV, ondansetron, [DISCONTINUED] acetaminophen  Ht: 5\' 7"  (170.2 cm)  Wt: 130 lb 8.2 oz (59.2 kg)  Ideal Wt: 61.6 kg % Ideal Wt:97%  Usual Wt:  Wt Readings from Last 10 Encounters:  10/30/12 130 lb 8.2 oz (59.2 kg)  07/23/12 133 lb 4.8 oz (60.464 kg)  07/12/12 141 lb 12.8 oz (64.32 kg)  06/02/12 135 lb 8 oz (61.462 kg)  05/11/12 150 lb (68.04 kg)  05/11/12 150 lb (68.04 kg)  04/22/12 150 lb (68.04 kg)     Body mass index is 20.44 kg/(m^2). Normal Range  Food/Nutrition Related Hx:Pt reports poor appetite and po intakebreakfast intake was jello, grape juice and broth. Severe, non-volitional wt loss 20#, 13% in 5 months. She has been eating Ensure pudding at home to supplement nutrition and is agreeable to drink [employer] as well due to inadequate meal intake.  Pt meets criteria for Severe  MALNUTRITION in the context of acute illness as evidenced by non-volitional wt loss of >10% in 6 months, energy intake </=75% for >/= 1 month.    CMP     Component Value Date/Time   NA 125* 10/30/2012 0447   K 4.5 10/30/2012 0447   CL 87* 10/30/2012  0447   CO2 26 10/30/2012 0447   GLUCOSE 129* 10/30/2012 0447   BUN 24* 10/30/2012 0447   CREATININE 0.96 10/30/2012 0447   CREATININE 0.64 07/08/2012 1528   CALCIUM 8.8 10/30/2012 0447   PROT 6.6 10/29/2012 0900   ALBUMIN 2.3* 10/29/2012 0900   AST 12 10/29/2012 0900   ALT 9 10/29/2012 0900   ALKPHOS 117 10/29/2012 0900   BILITOT 0.3 10/29/2012 0900   GFRNONAA 60* 10/30/2012 0447   GFRAA 69* 10/30/2012 0447   CBG (last 3)   Basename 10/30/12 0736 10/29/12 2027 10/29/12 1612  GLUCAP 120* 123* 176*     Intake/Output Summary (Last 24 hours) at 10/30/12 1211 Last data filed at 10/30/12 0950  Gross per 24 hour  Intake 2236.75 ml  Output    600 ml  Net 1636.75 ml    Diet Order: General    Supplements: Ensure Pudding po TID, each supplement provides 170 kcal and 4 grams of protein.   IVF:    [DISCONTINUED] sodium chloride Last Rate: 75 mL/hr at 10/29/12 2201    Estimated Nutritional Needs:   Kcal:1652-1880 kcal/day Protein:77-89 gr/day Fluid:1 ml/kcal/day  NUTRITION DIAGNOSIS: -Inadequate oral intake (NI-2.1).  Status: Ongoing  RELATED TO: poor appetite  AS EVIDENCE BY: diet hx and observed meal intake <50%, wt loss of 20# x 5 months  MONITORING/EVALUATION(Goals): Monitor meals and supplement intake, wt trends Goal: Pt to meet >/= 90% of their estimated nutrition needs; not met  EDUCATION NEEDS: -Education needs addressed  INTERVENTION: -Add Resource Breeze po TID, each supplement provides 250 kcal and 9 grams of protein.  Dietitian [phone]  DOCUMENTATION CODES Per approved criteria  -Severe malnutrition in the context of acute illness or injury    Francene Boyers 10/30/2012, 12:09 PM

## 2012-10-30 NOTE — Progress Notes (Signed)
Up in chair with 1 assist. Patient tolerated transfer from bed to chair well.

## 2012-10-30 NOTE — Progress Notes (Addendum)
Overnight, patient has remained off BiPAP.  Subjective: Feels better. Reports about a 20-30 pound weight loss over the past year. Reports no imaging of her chest recently. Confirms that her MRI scan has been postponed to December 23. Has not yet been out of bed.  Objective: Vital signs in last 24 hours: Filed Vitals:   10/30/12 0307 10/30/12 0600 10/30/12 0700 10/30/12 0710  BP:   117/72   Pulse:   100   Temp:  97.3 F (36.3 C)    TempSrc:  Axillary    Resp:   30   Height:      Weight:  59.2 kg (130 lb 8.2 oz)    SpO2: 99%  100% 98%   Weight change:   Intake/Output Summary (Last 24 hours) at 10/30/12 0841 Last data filed at 10/30/12 0700  Gross per 24 hour  Intake 2461.75 ml  Output    500 ml  Net 1961.75 ml   General: Alert, eating breakfast. Breathing nonlabored. Occasional wet cough Lungs: Rhonchi. No wheeze Cardiovascular regular rate rhythm without murmurs gallops rubs Abdomen soft nontender nondistended Extremities no clubbing cyanosis or edema  Lab Results: Basic Metabolic Panel:  Lab 10/30/12 1610 10/29/12 0859  NA 125* 124*  K 4.5 4.2  CL 87* 85*  CO2 26 27  GLUCOSE 129* 212*  BUN 24* 12  CREATININE 0.96 0.66  CALCIUM 8.8 9.4  MG -- --  PHOS -- --   Liver Function Tests:  Lab 10/29/12 0900  AST 12  ALT 9  ALKPHOS 117  BILITOT 0.3  PROT 6.6  ALBUMIN 2.3*   No results found for this basename: LIPASE:2,AMYLASE:2 in the last 168 hours No results found for this basename: AMMONIA:2 in the last 168 hours CBC:  Lab 10/29/12 0859  WBC 12.6*  NEUTROABS 10.5*  HGB 9.6*  HCT 29.4*  MCV 77.8*  PLT 476*   Cardiac Enzymes:  Lab 10/29/12 0859  CKTOTAL --  CKMB --  CKMBINDEX --  TROPONINI <0.30   BNP: No results found for this basename: PROBNP:3 in the last 168 hours D-Dimer: No results found for this basename: DDIMER:2 in the last 168 hours CBG:  Lab 10/30/12 0736 10/29/12 2027 10/29/12 1612  GLUCAP 120* 123* 176*   Hemoglobin  A1C:  Lab 10/29/12 0900  HGBA1C 6.6*   Fasting Lipid Panel: No results found for this basename: CHOL,HDL,LDLCALC,TRIG,CHOLHDL,LDLDIRECT in the last 960 hours Thyroid Function Tests:  Lab 10/29/12 1225  TSH 2.173  T4TOTAL --  FREET4 --  T3FREE --  THYROIDAB --   Coagulation: No results found for this basename: LABPROT:4,INR:4 in the last 168 hours Anemia Panel:  Lab 10/29/12 1225  VITAMINB12 --  FOLATE --  FERRITIN 280  TIBC --  IRON --  RETICCTPCT --   Urine Drug Screen: Drugs of Abuse  No results found for this basename: labopia, cocainscrnur, labbenz, amphetmu, thcu, labbarb    Alcohol Level: No results found for this basename: ETH:2 in the last 168 hours Urinalysis:  Lab 10/29/12 1130  COLORURINE AMBER*  LABSPEC 1.025  PHURINE 5.5  GLUCOSEU NEGATIVE  HGBUR NEGATIVE  BILIRUBINUR SMALL*  KETONESUR NEGATIVE  PROTEINUR 30*  UROBILINOGEN 0.2  NITRITE NEGATIVE  LEUKOCYTESUR NEGATIVE    Micro Results: Recent Results (from the past 240 hour(s))  CULTURE, BLOOD (ROUTINE X 2)     Status: Normal (Preliminary result)   Collection Time   10/29/12  9:03 AM      Component Value Range Status Comment   Specimen  Description Blood LEFT HAND   Final    Special Requests NONE 6CC   Final    Culture NO GROWTH <24 HRS   Final    Report Status PENDING   Incomplete   CULTURE, BLOOD (ROUTINE X 2)     Status: Normal (Preliminary result)   Collection Time   10/29/12  9:04 AM      Component Value Range Status Comment   Specimen Description Blood RIGHT ASSIST CONTROL   Final    Special Requests NONE 6CC   Final    Culture NO GROWTH <24 HRS   Final    Report Status PENDING   Incomplete   MRSA PCR SCREENING     Status: Normal   Collection Time   10/29/12  1:24 PM      Component Value Range Status Comment   MRSA by PCR NEGATIVE  NEGATIVE Final    Studies/Results: Dg Chest Port 1 View  10/29/2012  *RADIOLOGY REPORT*  Clinical Data: Cough, CHF, infiltrate  PORTABLE CHEST -  1 VIEW  Comparison: 04/22/2012; chest CT - 04/22/2012  Findings:  Unchanged enlarged cardiac silhouette and mediastinal contours. Atherosclerotic calcifications within the thoracic aorta.  Stable postsurgical change of the right lung and right hemithorax.  Left basilar heterogeneous air space opacities.  No focal right-sided airspace opacities.  Grossly unchanged nodular, slightly spiculated opacity overlying the peripheral aspect of the right lung corresponding to the nodule seen on recent chest CT.  Grossly unchanged bones including extensive postsurgical change of the superior aspect of the right thorax.  Ventriculoperitoneal tubing courses along the midline of the left hemithorax.  IMPRESSION: 1.  Left basilar heterogeneous possible air space opacities worrisome for infection. A follow-up chest radiograph in 4 to 6 weeks after treatment is recommended to ensure resolution. 2.  Grossly unchanged nodular opacity overlying the peripheral aspect of the right lower lung as demonstrated on recent chest CT. This nodule again remains concerning for malignancy.   Original Report Authenticated By: Tacey Ruiz, MD    Scheduled Meds:   . albuterol  2.5 mg Nebulization Q4H  . [COMPLETED] albuterol  5 mg Nebulization Once  . [COMPLETED] albuterol  5 mg Nebulization Once  . [COMPLETED] albuterol  5 mg Nebulization Once  . antiseptic oral rinse  15 mL Mouth Rinse BID  . [COMPLETED] cefTRIAXone (ROCEPHIN)  IV  1 g Intravenous Once  . enoxaparin (LOVENOX) injection  40 mg Subcutaneous Q24H  . fluticasone  2 spray Each Nare BH-q7a  . insulin aspart  0-9 Units Subcutaneous TID WC  . [COMPLETED] ipratropium  0.5 mg Nebulization Once  . [COMPLETED] ipratropium  0.5 mg Nebulization Once  . [COMPLETED] ipratropium  0.5 mg Nebulization Once  . ipratropium  0.5 mg Nebulization QID  . [COMPLETED] levofloxacin (LEVAQUIN) IV  750 mg Intravenous Once  . levofloxacin (LEVAQUIN) IV  750 mg Intravenous Q24H  . levothyroxine   88 mcg Oral QAC breakfast  . [COMPLETED] methylPREDNISolone (SOLU-MEDROL) injection  125 mg Intravenous Once  . methylPREDNISolone (SOLU-MEDROL) injection  60 mg Intravenous Q12H  . nicotine  21 mg Transdermal Daily  . pantoprazole (PROTONIX) IV  40 mg Intravenous Q24H  . sodium chloride  3 mL Intravenous Q12H  . theophylline  300 mg Oral BID  . vancomycin  750 mg Intravenous Q12H  . [COMPLETED] vancomycin  1,000 mg Intravenous Once  . venlafaxine XR  300 mg Oral Q breakfast  . [DISCONTINUED] albuterol  15 mg Nebulization Once  . [DISCONTINUED]  ipratropium  1 mg Nebulization Once   Continuous Infusions:   . sodium chloride 75 mL/hr at 10/29/12 2201   PRN Meds:.acetaminophen, acetaminophen, busPIRone, morphine injection, ondansetron (ZOFRAN) IV, ondansetron Assessment/Plan: Principal Problem:  *Acute-on-chronic respiratory failure Active Problems:  CAP (community acquired pneumonia)  COPD exacerbation  HTN (hypertension)  Lactic acidosis  Hyponatremia  Hyperglycemia  Tobacco abuse  History of lung cancer  Hypothyroidism  Patient is much improved today. Will change antibiotics and steroids to by mouth. Change bronchodilators to 4 times a day. Increase activity. Transfer to med-surg if stable. Patient's history of lung cancer and abnormal CT chest and abdomen in the setting of weight loss is concerning for recurrence. Will check CT chest. Blood sugars are not terribly elevated. Will monitor and change CBGs to her daily. Sodium only slightly improved despite saline. suspect SIADH. Will check urine and serum osmolality. TSH normal.   LOS: 1 day   Eyonna Sandstrom L 10/30/2012, 8:41 AM   Discussed with Dr. Steele Berg.  Spiculated lung nodule is about the same in size. Has an enlarging pleural effusion likely related to subphrenic process which has increased in size. Patient reports that she has seen Dr. Daisey Must in the past who is apparently a Careers adviser at [facility]. She reports that because  the lung nodule size has not changed in 10 years, serial scans were recommended. She has an appointment with him in March of next year. She also reports seeing a "Dr. Joselyn Glassman" at the [facility]. Conducted a Programmer, multimedia, and cannot find this person on staff there. I suspect this is the mid-level. Unfortunately, their office is closed. Patient in June weighed 150 pounds. Currently 130 pounds. Will consult IR to consider percutaneous drainage for diagnostic purposes. Patient was evaluated by Dr. Franky Macho and Dr. Karilyn Cota, and was treated empirically for possible abscess with a several week course of antibiotics. Despite this, the fluid collection has increased in size. She also had an area concerning for cancer on colonoscopy, but biopsy just showed inflammatory process and no malignancy. She has not been on anticoagulation.

## 2012-10-30 NOTE — Progress Notes (Signed)
UR chart review completed.  

## 2012-10-30 NOTE — Progress Notes (Signed)
Patient continues to do well off the BIPAP;has period of rapid breathing that has been during period of coughing. She continues to be on 2lpm [place] and BIPAP is at bedside.

## 2012-10-31 ENCOUNTER — Inpatient Hospital Stay (HOSPITAL_COMMUNITY): Payer: Medicare Other

## 2012-10-31 ENCOUNTER — Inpatient Hospital Stay: Admit: 2012-10-31 | Payer: Medicare Other

## 2012-10-31 DIAGNOSIS — R935 Abnormal findings on diagnostic imaging of other abdominal regions, including retroperitoneum: Secondary | ICD-10-CM

## 2012-10-31 DIAGNOSIS — D62 Acute posthemorrhagic anemia: Secondary | ICD-10-CM | POA: Diagnosis present

## 2012-10-31 DIAGNOSIS — D649 Anemia, unspecified: Secondary | ICD-10-CM

## 2012-10-31 LAB — BASIC METABOLIC PANEL
BUN: 22 mg/dL (ref 6–23)
Calcium: 8.8 mg/dL (ref 8.4–10.5)
Creatinine, Ser: 0.73 mg/dL (ref 0.50–1.10)
GFR calc Af Amer: >90 mL/min (ref >90)
GFR calc non Af Amer: 86 mL/min — ABNORMAL LOW (ref >90)
Glucose, Bld: 132 mg/dL — ABNORMAL HIGH (ref 70–99)

## 2012-10-31 MED ORDER — IOHEXOL 300 MG/ML  SOLN
100.0000 mL | Freq: Once | INTRAMUSCULAR | Status: AC | PRN
  Administered 2012-10-31: 100 mL via INTRAVENOUS

## 2012-10-31 MED ORDER — PREDNISONE 20 MG PO TABS
40.0000 mg | ORAL_TABLET | Freq: Every day | ORAL | Status: DC
  Administered 2012-11-01: 40 mg via ORAL
  Filled 2012-10-31: qty 2

## 2012-10-31 NOTE — Progress Notes (Signed)
Discussed with Dr. Archer Asa, IR.   Subjective: Shortness of breath resolved. Minimal cough. Appetite about the same. Larey Seat out of bed and lacerated her lip week or 2 ago. She denies having injured anything else. Denies any other trauma.  Objective: Vital signs in last 24 hours: Filed Vitals:   10/31/12 0406 10/31/12 0500 10/31/12 0824 10/31/12 1129  BP: 155/84     Pulse: 108     Temp: 97.8 F (36.6 C)     TempSrc: Oral     Resp: 24     Height:      Weight: 61.6 kg (135 lb 12.9 oz) 61.6 kg (135 lb 12.9 oz)    SpO2: 98%  94% 98%   Weight change: 3.3 kg (7 lb 4.4 oz)  Intake/Output Summary (Last 24 hours) at 10/31/12 1417 Last data filed at 10/30/12 2110  Gross per 24 hour  Intake    240 ml  Output    150 ml  Net     90 ml   General: Alert, comfortable. Breathing nonlabored.  Lungs: Rhonchi. No wheeze Cardiovascular regular rate rhythm without murmurs gallops rubs Abdomen soft nontender slightly distended Extremities no clubbing cyanosis or edema  Lab Results: Basic Metabolic Panel:  Lab 10/31/12 1610 10/30/12 0447  NA 127* 125*  K 4.8 4.5  CL 89* 87*  CO2 28 26  GLUCOSE 132* 129*  BUN 22 24*  CREATININE 0.73 0.96  CALCIUM 8.8 8.8  MG -- --  PHOS -- --   Liver Function Tests:  Lab 10/29/12 0900  AST 12  ALT 9  ALKPHOS 117  BILITOT 0.3  PROT 6.6  ALBUMIN 2.3*   No results found for this basename: LIPASE:2,AMYLASE:2 in the last 168 hours No results found for this basename: AMMONIA:2 in the last 168 hours CBC:  Lab 10/31/12 0635 10/30/12 1135 10/29/12 0859  WBC -- 21.4* 12.6*  NEUTROABS -- -- 10.5*  HGB 7.6* 8.0* --  HCT 22.4* 24.3* --  MCV -- 78.1 77.8*  PLT -- 500* 476*   Cardiac Enzymes:  Lab 10/29/12 0859  CKTOTAL --  CKMB --  CKMBINDEX --  TROPONINI <0.30   BNP: No results found for this basename: PROBNP:3 in the last 168 hours D-Dimer: No results found for this basename: DDIMER:2 in the last 168 hours CBG:  Lab 10/31/12 0751  10/30/12 1126 10/30/12 0736 10/29/12 2027 10/29/12 1612  GLUCAP 128* 133* 120* 123* 176*   Hemoglobin A1C:  Lab 10/29/12 0900  HGBA1C 6.6*   Fasting Lipid Panel: No results found for this basename: CHOL,HDL,LDLCALC,TRIG,CHOLHDL,LDLDIRECT in the last 960 hours Thyroid Function Tests:  Lab 10/29/12 1225  TSH 2.173  T4TOTAL --  FREET4 --  T3FREE --  THYROIDAB --   Coagulation: No results found for this basename: LABPROT:4,INR:4 in the last 168 hours Anemia Panel:  Lab 10/29/12 1225  VITAMINB12 --  FOLATE --  FERRITIN 280  TIBC --  IRON --  RETICCTPCT --   Serum osmolality 272.  Urine osmolality 516  Urinalysis:  Lab 10/29/12 1130  COLORURINE AMBER*  LABSPEC 1.025  PHURINE 5.5  GLUCOSEU NEGATIVE  HGBUR NEGATIVE  BILIRUBINUR SMALL*  KETONESUR NEGATIVE  PROTEINUR 30*  UROBILINOGEN 0.2  NITRITE NEGATIVE  LEUKOCYTESUR NEGATIVE    Micro Results: Recent Results (from the past 240 hour(s))  CULTURE, BLOOD (ROUTINE X 2)     Status: Normal (Preliminary result)   Collection Time   10/29/12  9:03 AM      Component Value Range Status Comment  Specimen Description Blood LEFT HAND   Final    Special Requests NONE 6CC   Final    Culture NO GROWTH 2 DAYS   Final    Report Status PENDING   Incomplete   CULTURE, BLOOD (ROUTINE X 2)     Status: Normal (Preliminary result)   Collection Time   10/29/12  9:04 AM      Component Value Range Status Comment   Specimen Description Blood RIGHT ASSIST CONTROL   Final    Special Requests NONE 6CC   Final    Culture NO GROWTH 2 DAYS   Final    Report Status PENDING   Incomplete   URINE CULTURE     Status: Normal   Collection Time   10/29/12 11:30 AM      Component Value Range Status Comment   Specimen Description URINE, CATHETERIZED   Final    Special Requests NONE   Final    Culture  Setup Time 10/29/2012 12:10   Final    Colony Count NO GROWTH   Final    Culture NO GROWTH   Final    Report Status 10/30/2012 FINAL   Final    MRSA PCR SCREENING     Status: Normal   Collection Time   10/29/12  1:24 PM      Component Value Range Status Comment   MRSA by PCR NEGATIVE  NEGATIVE Final    Studies/Results: Ct Chest W Contrast  10/30/2012  *RADIOLOGY REPORT*  Clinical Data: History of lung cancer with weight loss.  Follow-up pulmonary nodule.  CT CHEST WITH CONTRAST  Technique:  Multidetector CT imaging of the chest was performed following the standard protocol during bolus administration of intravenous contrast.  Contrast: 80mL OMNIPAQUE IOHEXOL 300 MG/ML  SOLN  Comparison: Chest CT 04/22/2012.  Abdominal pelvic CT 07/28/2012.  Findings: There is stable volume loss in the right hemithorax status post right upper lobe resection.  The spiculated right lower lobe nodule has not significantly changed, measuring 2.0 x 1.5 cm on image 38. A 4 mm retrosternal nodule in the right middle lobe on image 26 is unchanged.  The scarring superior to the aortic arch in the left upper lobe has improved.  There is no evidence of focal mass in this area.  There is new left lower lobe atelectasis adjacent to an enlarging left pleural effusion. Emphysematous changes are again noted.  The mediastinum has a stable appearance with aortic atherosclerosis and central enlargement of the pulmonary arteries.  There are no enlarged mediastinal or hilar lymph nodes.  There is a moderate sized dependent left pleural effusion.  No significant pleural fluid is seen on the right.  There is no pericardial effusion.  Images through the upper abdomen demonstrate an enlarging perinephric fluid collection on the left with increased mass effect on the left kidney.  This is incompletely imaged, but measures up to 8.1 x 6.1 cm transverse on image #6 of series 8.  There is a new complex splenic subcapsular fluid collection with intermediate and low density components.  The low density component measures 8.3 x 5.7 cm transverse on image 47.  The higher density component  beneath the left hemidiaphragm extends anteriorly and is suspicious for a subcapsular hematoma or infection.  There is an abnormal density inferior to the left hepatic lobe and adjacent to the stomach which does not appear to reflect the previously demonstrated redundant transverse colon.  Based on the reformatted images, this appears contiguous with the splenic  process and is suspicious for extracapsular extension.  There is persistent colonic wall thickening at the hepatic flexure.  IMPRESSION:  1.  No significant change in spiculated right lower lobe mass remaining very concerning for bronchogenic carcinoma based on morphology. 2.  No other evidence of intrathoracic malignancy status post right upper lobe resection. 3.  Moderate sized left pleural effusion with associated left lower lobe atelectasis.  This pleural fluid is likely related to the progressive subphrenic process. 4. Enlarging left perinephric fluid collection with new left subphrenic fluid collections, likely representing splenic subcapsular hematoma or abscess.  There is concern of extracapsular extension of this process anteriorly between the stomach and left hepatic lobe.  Percutaneous aspiration/drainage should be considered.  These results were called by telephone on 10/30/2012 at 1005 hours to Dr. Lendell Caprice, who verbally acknowledged these results.   Original Report Authenticated By: Carey Bullocks, M.D.    Scheduled Meds:    . albuterol  2.5 mg Nebulization QID  . antiseptic oral rinse  15 mL Mouth Rinse BID  . arformoterol  15 mcg Nebulization BID  . feeding supplement  1 Container Oral TID BM  . fluticasone  2 spray Each Nare BH-q7a  . ipratropium  0.5 mg Nebulization QID  . levofloxacin  750 mg Oral Daily  . levothyroxine  88 mcg Oral QAC breakfast  . nicotine  21 mg Transdermal Daily  . pantoprazole  40 mg Oral Daily  . predniSONE  40 mg Oral BID  . sodium chloride  3 mL Intravenous Q12H  . theophylline  300 mg Oral BID  .  venlafaxine XR  300 mg Oral Q breakfast   Continuous Infusions:  PRN Meds:.acetaminophen, albuterol, busPIRone, [COMPLETED] iohexol, morphine injection, ondansetron (ZOFRAN) IV, ondansetron Assessment/Plan: Principal Problem:  *Acute-on-chronic respiratory failure Active Problems:  CAP (community acquired pneumonia)  COPD exacerbation  Abnormal CT of the abdomen  Anemia, acute on chronic. Likely secondary to enlarging intra-abdominal hematoma.  HTN (hypertension)  Hyponatremia, likely SIADH. Will fluid restrict and monitor  Tobacco abuse  History of lung cancer  Hypothyroidism: TSH is normal Leukocytosis likely stress demargination from steroids.  Per Dr. Archer Asa, CT of the abdomen with contrast shows complex cystic structure which could be partially hematoma, partially abscess, even cystic mass. He recommends MRI of the abdomen and pelvis to further characterize prior to drainage, particularly in case this is a neoplastic process. Will order. If hemoglobin drops further, patient will require transfusion and she is agreeable. Patient's husband reports that her oncologist was Dr. Okey Dupre at [facility] previously. Also involved was Dr. Amie Critchley. Please see yesterday's note for further details. Taper steroids. Patient does not wish to quit smoking at this time   LOS: 2 days   Tracey Martinez 10/31/2012, 2:17 PM

## 2012-11-01 ENCOUNTER — Inpatient Hospital Stay (HOSPITAL_COMMUNITY): Payer: Medicare Other

## 2012-11-01 DIAGNOSIS — C3490 Malignant neoplasm of unspecified part of unspecified bronchus or lung: Secondary | ICD-10-CM

## 2012-11-01 LAB — CBC WITH DIFFERENTIAL/PLATELET
Basophils Relative: 0 % (ref 0–1)
Eosinophils Relative: 0 % (ref 0–5)
HCT: 21 % — ABNORMAL LOW (ref 36.0–46.0)
Hemoglobin: 7 g/dL — ABNORMAL LOW (ref 12.0–15.0)
MCHC: 33.3 g/dL (ref 30.0–36.0)
MCV: 76.1 fL — ABNORMAL LOW (ref 78.0–100.0)
Monocytes Absolute: 1.1 10*3/uL — ABNORMAL HIGH (ref 0.1–1.0)
Monocytes Relative: 5 % (ref 3–12)
Neutro Abs: 18.3 10*3/uL — ABNORMAL HIGH (ref 1.7–7.7)

## 2012-11-01 LAB — BASIC METABOLIC PANEL
BUN: 11 mg/dL (ref 6–23)
CO2: 30 mEq/L (ref 19–32)
Chloride: 90 mEq/L — ABNORMAL LOW (ref 96–112)
GFR calc Af Amer: >90 mL/min (ref >90)
Glucose, Bld: 96 mg/dL (ref 70–99)
Potassium: 4.6 mEq/L (ref 3.5–5.1)

## 2012-11-01 MED ORDER — DOCUSATE SODIUM 100 MG PO CAPS
100.0000 mg | ORAL_CAPSULE | Freq: Two times a day (BID) | ORAL | Status: DC
  Administered 2012-11-01 – 2012-11-06 (×12): 100 mg via ORAL
  Filled 2012-11-01 (×13): qty 1

## 2012-11-01 MED ORDER — SENNA 8.6 MG PO TABS: 2.0000 | ORAL_TABLET | Freq: Every day | ORAL | Status: DC | PRN

## 2012-11-01 MED ORDER — PREDNISONE 20 MG PO TABS
30.0000 mg | ORAL_TABLET | Freq: Every day | ORAL | Status: DC
  Administered 2012-11-02: 30 mg via ORAL
  Filled 2012-11-01: qty 1

## 2012-11-01 NOTE — Progress Notes (Signed)
Pt heart rate irregular ranges from 80 to 120 .

## 2012-11-01 NOTE — Progress Notes (Signed)
Subjective: Feels weak.  Breathing ok.  Some cough.  Ate a good breakfast.   Objective: Vital signs in last 24 hours: Filed Vitals:   10/31/12 2100 11/01/12 0611 11/01/12 0828 11/01/12 1211  BP: 127/86 139/90    Pulse: 114 100    Temp: 98.5 F (36.9 C) 98 F (36.7 C)    TempSrc: Oral Oral    Resp: 21 19    Height:      Weight:      SpO2: 99% 98% 96% 99%   Weight change:  No intake or output data in the 24 hours ending 11/01/12 1222 General: Alert, comfortable. Breathing nonlabored. In chair. Coughing Lungs: Rhonchi. No wheeze Cardiovascular regular rate rhythm without murmurs gallops rubs Abdomen soft nontender slightly distended Extremities no clubbing cyanosis or edema  Lab Results: Basic Metabolic Panel:  Lab 11/01/12 1610 10/31/12 0635  NA 127* 127*  K 4.6 4.8  CL 90* 89*  CO2 30 28  GLUCOSE 96 132*  BUN 11 22  CREATININE 0.58 0.73  CALCIUM 8.9 8.8  MG -- --  PHOS -- --   Liver Function Tests:  Lab 10/29/12 0900  AST 12  ALT 9  ALKPHOS 117  BILITOT 0.3  PROT 6.6  ALBUMIN 2.3*   No results found for this basename: LIPASE:2,AMYLASE:2 in the last 168 hours No results found for this basename: AMMONIA:2 in the last 168 hours CBC:  Lab 11/01/12 0644 10/31/12 0635 10/30/12 1135 10/29/12 0859  WBC 20.2* -- 21.4* --  NEUTROABS 18.3* -- -- 10.5*  HGB 7.0* 7.6* -- --  HCT 21.0* 22.4* -- --  MCV 76.1* -- 78.1 --  PLT 491* -- 500* --   Cardiac Enzymes:  Lab 10/29/12 0859  CKTOTAL --  CKMB --  CKMBINDEX --  TROPONINI <0.30   BNP: No results found for this basename: PROBNP:3 in the last 168 hours D-Dimer: No results found for this basename: DDIMER:2 in the last 168 hours CBG:  Lab 11/01/12 0734 10/31/12 0751 10/30/12 1126 10/30/12 0736 10/29/12 2027 10/29/12 1612  GLUCAP 112* 128* 133* 120* 123* 176*   Hemoglobin A1C:  Lab 10/29/12 0900  HGBA1C 6.6*   Fasting Lipid Panel: No results found for this basename:  CHOL,HDL,LDLCALC,TRIG,CHOLHDL,LDLDIRECT in the last 960 hours Thyroid Function Tests:  Lab 10/29/12 1225  TSH 2.173  T4TOTAL --  FREET4 --  T3FREE --  THYROIDAB --   Coagulation: No results found for this basename: LABPROT:4,INR:4 in the last 168 hours Anemia Panel:  Lab 10/29/12 1225  VITAMINB12 --  FOLATE --  FERRITIN 280  TIBC --  IRON --  RETICCTPCT --   Serum osmolality 272.  Urine osmolality 516  Urinalysis:  Lab 10/29/12 1130  COLORURINE AMBER*  LABSPEC 1.025  PHURINE 5.5  GLUCOSEU NEGATIVE  HGBUR NEGATIVE  BILIRUBINUR SMALL*  KETONESUR NEGATIVE  PROTEINUR 30*  UROBILINOGEN 0.2  NITRITE NEGATIVE  LEUKOCYTESUR NEGATIVE    Micro Results: Recent Results (from the past 240 hour(s))  CULTURE, BLOOD (ROUTINE X 2)     Status: Normal (Preliminary result)   Collection Time   10/29/12  9:03 AM      Component Value Range Status Comment   Specimen Description Blood LEFT HAND   Final    Special Requests NONE 6CC   Final    Culture NO GROWTH 3 DAYS   Final    Report Status PENDING   Incomplete   CULTURE, BLOOD (ROUTINE X 2)     Status: Normal (Preliminary result)  Collection Time   10/29/12  9:04 AM      Component Value Range Status Comment   Specimen Description Blood RIGHT ASSIST CONTROL   Final    Special Requests NONE 6CC   Final    Culture NO GROWTH 3 DAYS   Final    Report Status PENDING   Incomplete   URINE CULTURE     Status: Normal   Collection Time   10/29/12 11:30 AM      Component Value Range Status Comment   Specimen Description URINE, CATHETERIZED   Final    Special Requests NONE   Final    Culture  Setup Time 10/29/2012 12:10   Final    Colony Count NO GROWTH   Final    Culture NO GROWTH   Final    Report Status 10/30/2012 FINAL   Final   MRSA PCR SCREENING     Status: Normal   Collection Time   10/29/12  1:24 PM      Component Value Range Status Comment   MRSA by PCR NEGATIVE  NEGATIVE Final    Studies/Results: Ct Abdomen W  Contrast  10/31/2012  *RADIOLOGY REPORT*  Clinical Data: Evaluate complex left upper quadrant fluid collection  CT ABDOMEN WITH CONTRAST  Technique:  Multidetector CT imaging of the abdomen was performed following the standard protocol during bolus administration of intravenous contrast.  Contrast: OMNIPAQUE IOHEXOL 300 MG/ML  SOLN  Comparison: CT scan of the chest 10/30/2012; CT scan abdomen/pelvis 07/28/2012  Findings:  Lower Chest:  No significant interval change in the appearance of the moderate left layering pleural effusion and left lower lobe atelectasis.  Additionally, the previously identified spiculated nodule in the periphery of the right lower lobe is also unchanged at 2 x 1.5 cm. Stable visualized cardiac and mediastinal structures.  No pericardial effusion.  Abdomen: Interval development of a complex fluid collection in the left subdiaphragmatic space which measures 10 x 9.2 x 6.3 cm in greatest transverse dimension (AP by CC by TR).  This fluid collection is intimately associated with the upper pole of the spleen displacement inferiorly and exerting mass effect on the cortex.  This fluid collection is likely within the subcapsular space.  There is a well-defined attenuation differential within the collection.  The cephalad aspect of the collection is water attenuation while inferiorly the collection is of intermediate attenuation ranging from 49-64.  The collection appears to extend anteriorly between the left hepatic lobe and over the fundus of the stomach and then inferiorly into the gastrohepatic ligament.  This portion of the collection is all of relatively high attenuation.  Interval enlargement of complex multiloculated subcapsular fluid collection about the left kidney. Measuring along similar landmarks as a prior study, the anterior collection measures 6.8 x 5.9 cm compared 3.0 x 2.1 cm previously at the upper pole of the kidney, and 6.1 x 4.2 cm compared to 3.3 x 1.6 cm in the  interpolar region. The collections are low attenuation centrally but demonstrate a thick enhancing capsule.  The collection does abut the inferior margin of the complex cystic collection in the subdiaphragmatic space.  The tail of the pancreas is also interposition between these two complex subcapsular fluid collections and cannot be separated from them. High attenuation material layering within the gallbladder consistent with sludge and small stones.  No evidence of bowel obstruction.  Left adrenal gland is displaced by the subdiaphragmatic fluid collection.  The right adrenal gland is within normal limits.  Right kidney  is within normal limits.  Bones: No acute fracture or aggressive appearing lytic or blastic osseous lesion.  Vascular: Scattered atherosclerotic vascular calcification.  No evidence of active extravasation of contrast on the delayed images.  IMPRESSION:  1.  Interval development of a large, complex left subdiaphragmatic fluid collection the epicenter of which appears to be arising from the subcapsular space of the spleen.  The fluid collection demonstrates clear fluid-fluid levels with high attenuation layering inferiorly and extending anteriorly over the gastric fundus and into the gastrohepatic ligament.  The high attenuation may represent hematoma and / or enhancing soft tissue.  This complex fluid collection abuts an enlarging multiloculated cystic collection in the subcapsular space of the left kidney.  The pancreatic tail is sandwich between these two complex collections and is difficult to separate from the collections.  The findings are most consistent with a very complex multiloculated predominately cystic mass with areas of likely hemorrhage/hematoma and/or enhancing soft tissue.  Differential considerations include a mucinous cystic neoplasm of the pancreas with superimposed hemorrhage, a hemorrhagic pancreatic pseudocyst, and less likely (given the patients improving clinical status) an  abscess collection with a hemorrhagic component.  A mass lesion such as a neoplasm, or potentially a hemorrhagic pseudocyst is favored given the degree of mass effect on the surrounding structures.  Consider MRI with and without contrast to evaluate for an enhancing soft tissue complement which would favor a diagnosis of neoplasm. If the patient begins to develop signs and symptoms of infection/sepsis, CT guided aspiration could be performed to evaluate the fluid content of the more cystic portions of the complex collection.  I would hesitate to place a drain in these collections until neoplasm is ruled out to prevent seeding of the draining tract.  Importantly, there is no evidence of active bleeding on the delayed phase images.  These results and recommendations were called by telephone on 10/31/2012 at approximately 02:00 p.m. to Dr. Lendell Caprice, who verbally acknowledged these results.   Original Report Authenticated By: Malachy Moan, M.D.    Scheduled Meds:    . albuterol  2.5 mg Nebulization QID  . antiseptic oral rinse  15 mL Mouth Rinse BID  . arformoterol  15 mcg Nebulization BID  . feeding supplement  1 Container Oral TID BM  . fluticasone  2 spray Each Nare BH-q7a  . ipratropium  0.5 mg Nebulization QID  . levofloxacin  750 mg Oral Daily  . levothyroxine  88 mcg Oral QAC breakfast  . nicotine  21 mg Transdermal Daily  . pantoprazole  40 mg Oral Daily  . predniSONE  40 mg Oral Q breakfast  . sodium chloride  3 mL Intravenous Q12H  . theophylline  300 mg Oral BID  . venlafaxine XR  300 mg Oral Q breakfast  . [DISCONTINUED] predniSONE  40 mg Oral BID   Continuous Infusions:  PRN Meds:.acetaminophen, albuterol, busPIRone, morphine injection, ondansetron (ZOFRAN) IV, ondansetron Assessment/Plan: Principal Problem:  *Acute-on-chronic respiratory failure Active Problems:  CAP (community acquired pneumonia)  COPD exacerbation  Abnormal CT of the abdomen  Anemia, acute on chronic.  Likely secondary to enlarging intra-abdominal hematoma.  HTN (hypertension)  Hyponatremia, likely SIADH. Will fluid restrict and monitor  Tobacco abuse  History of lung cancer  Hypothyroidism: TSH is normal Leukocytosis likely stress demargination from steroids.  Await MRI.  Anemia worse.  Will transfuse one unit PRBC.  Patient interested in seeing Dr. Mariel Sleet for lung nodule.   LOS: 3 days   Tracey Martinez 11/01/2012, 12:22 PM

## 2012-11-02 ENCOUNTER — Ambulatory Visit (HOSPITAL_COMMUNITY): Payer: Medicare Other

## 2012-11-02 ENCOUNTER — Inpatient Hospital Stay (HOSPITAL_COMMUNITY): Payer: Medicare Other

## 2012-11-02 DIAGNOSIS — F17200 Nicotine dependence, unspecified, uncomplicated: Secondary | ICD-10-CM

## 2012-11-02 LAB — CBC
MCHC: 33 g/dL (ref 30.0–36.0)
Platelets: 492 10*3/uL — ABNORMAL HIGH (ref 150–400)
RDW: 17.6 % — ABNORMAL HIGH (ref 11.5–15.5)

## 2012-11-02 LAB — GLUCOSE, CAPILLARY: Glucose-Capillary: 95 mg/dL (ref 70–99)

## 2012-11-02 MED ORDER — PREDNISONE 20 MG PO TABS: 20.0000 mg | ORAL_TABLET | Freq: Every day | ORAL | Status: DC

## 2012-11-02 NOTE — Progress Notes (Signed)
This Clinical research associate called blood bank to see if blood was ready, per blood bank blood has not arrived from [facility] hospital. Blood bank stated that they would call [facility] to get update.

## 2012-11-02 NOTE — Progress Notes (Addendum)
Discussed with Ms. Ninfa Linden, nurse practitioner. Records from her office and from [facility] reviewed. MRI was canceled, as skull films showed aneurysm clips. Patient's transfusion was held till today, as there was difficulty matching.  Subjective: Complains of abdominal bloating. Appetite has improved slightly over the past few days. No bleeding.  Objective: Vital signs in last 24 hours: Filed Vitals:   11/02/12 1352 11/02/12 1633 11/02/12 1645 11/02/12 1718  BP: 132/83  141/87 146/86  Pulse: 106  80 108  Temp: 98.3 F (36.8 C)  97.8 F (36.6 C) 97.6 F (36.4 C)  TempSrc:   Oral Oral  Resp: 22  20 20   Height:      Weight:      SpO2: 97% 100%     Weight change:   Intake/Output Summary (Last 24 hours) at 11/02/12 1724 Last data filed at 11/02/12 0900  Gross per 24 hour  Intake    480 ml  Output      0 ml  Net    480 ml   General: Alert, comfortable. Breathing nonlabored. In chair.  Lungs: Rhonchi. No wheeze Cardiovascular regular rate rhythm without murmurs gallops rubs Abdomen soft nontender more distended today. Extremities no clubbing cyanosis or edema  Lab Results: Basic Metabolic Panel:  Lab 11/01/12 1610 10/31/12 0635  NA 127* 127*  K 4.6 4.8  CL 90* 89*  CO2 30 28  GLUCOSE 96 132*  BUN 11 22  CREATININE 0.58 0.73  CALCIUM 8.9 8.8  MG -- --  PHOS -- --   Liver Function Tests:  Lab 10/29/12 0900  AST 12  ALT 9  ALKPHOS 117  BILITOT 0.3  PROT 6.6  ALBUMIN 2.3*   No results found for this basename: LIPASE:2,AMYLASE:2 in the last 168 hours No results found for this basename: AMMONIA:2 in the last 168 hours CBC:  Lab 11/02/12 0524 11/01/12 0644 10/29/12 0859  WBC 17.1* 20.2* --  NEUTROABS -- 18.3* 10.5*  HGB 7.5* 7.0* --  HCT 22.7* 21.0* --  MCV 76.7* 76.1* --  PLT 492* 491* --   Cardiac Enzymes:  Lab 10/29/12 0859  CKTOTAL --  CKMB --  CKMBINDEX --  TROPONINI <0.30   BNP: No results found for this basename: PROBNP:3 in the last 168  hours D-Dimer: No results found for this basename: DDIMER:2 in the last 168 hours CBG:  Lab 11/02/12 0726 11/01/12 0734 10/31/12 0751 10/30/12 1126 10/30/12 0736 10/29/12 2027  GLUCAP 95 112* 128* 133* 120* 123*   Hemoglobin A1C:  Lab 10/29/12 0900  HGBA1C 6.6*   Fasting Lipid Panel: No results found for this basename: CHOL,HDL,LDLCALC,TRIG,CHOLHDL,LDLDIRECT in the last 960 hours Thyroid Function Tests:  Lab 10/29/12 1225  TSH 2.173  T4TOTAL --  FREET4 --  T3FREE --  THYROIDAB --   Coagulation: No results found for this basename: LABPROT:4,INR:4 in the last 168 hours Anemia Panel:  Lab 10/29/12 1225  VITAMINB12 --  FOLATE --  FERRITIN 280  TIBC --  IRON --  RETICCTPCT --   Serum osmolality 272.  Urine osmolality 516  Urinalysis:  Lab 10/29/12 1130  COLORURINE AMBER*  LABSPEC 1.025  PHURINE 5.5  GLUCOSEU NEGATIVE  HGBUR NEGATIVE  BILIRUBINUR SMALL*  KETONESUR NEGATIVE  PROTEINUR 30*  UROBILINOGEN 0.2  NITRITE NEGATIVE  LEUKOCYTESUR NEGATIVE    Micro Results: Recent Results (from the past 240 hour(s))  CULTURE, BLOOD (ROUTINE X 2)     Status: Normal (Preliminary result)   Collection Time   10/29/12  9:03 AM  Component Value Range Status Comment   Specimen Description Blood LEFT HAND   Final    Special Requests NONE 6CC   Final    Culture NO GROWTH 4 DAYS   Final    Report Status PENDING   Incomplete   CULTURE, BLOOD (ROUTINE X 2)     Status: Normal (Preliminary result)   Collection Time   10/29/12  9:04 AM      Component Value Range Status Comment   Specimen Description Blood RIGHT ASSIST CONTROL   Final    Special Requests NONE 6CC   Final    Culture NO GROWTH 4 DAYS   Final    Report Status PENDING   Incomplete   URINE CULTURE     Status: Normal   Collection Time   10/29/12 11:30 AM      Component Value Range Status Comment   Specimen Description URINE, CATHETERIZED   Final    Special Requests NONE   Final    Culture  Setup Time  10/29/2012 12:10   Final    Colony Count NO GROWTH   Final    Culture NO GROWTH   Final    Report Status 10/30/2012 FINAL   Final   MRSA PCR SCREENING     Status: Normal   Collection Time   10/29/12  1:24 PM      Component Value Range Status Comment   MRSA by PCR NEGATIVE  NEGATIVE Final    Studies/Results: Dg Skull 1-3 Views  11/01/2012  *RADIOLOGY REPORT*  Clinical Data: Pre MRI.  SKULL - 1-3 VIEW  Comparison: None.  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  Interventricular shunt is in place.  No acute abnormality. Prominent hyperostosis frontalis interna.  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  This is a contraindication to MRI.   Original Report Authenticated By: Francene Boyers, M.D.    Scheduled Meds:    . albuterol  2.5 mg Nebulization QID  . antiseptic oral rinse  15 mL Mouth Rinse BID  . arformoterol  15 mcg Nebulization BID  . docusate sodium  100 mg Oral BID  . feeding supplement  1 Container Oral TID BM  . fluticasone  2 spray Each Nare BH-q7a  . ipratropium  0.5 mg Nebulization QID  . levofloxacin  750 mg Oral Daily  . levothyroxine  88 mcg Oral QAC breakfast  . nicotine  21 mg Transdermal Daily  . pantoprazole  40 mg Oral Daily  . predniSONE  30 mg Oral Q breakfast  . sodium chloride  3 mL Intravenous Q12H  . theophylline  300 mg Oral BID  . venlafaxine XR  300 mg Oral Q breakfast   Continuous Infusions:  PRN Meds:.acetaminophen, albuterol, busPIRone, morphine injection, ondansetron (ZOFRAN) IV, ondansetron, senna Assessment/Plan: Principal Problem:  *Acute-on-chronic respiratory failure resolved   CAP (community acquired pneumonia): Last dose of antibiotics today.   COPD exacerbation: Now at baseline. Will taper her steroids.   Abnormal CT of the abdomen: Patient was admitted earlier this year and treated empirically with antibiotics. Surgery and GI were  consulted. The size of the fluid collection did not diminish with repeat scanning. She also had an abnormal colonoscopy, which initially appeared to be neoplasm, but biopsy showed inflammatory process only. Patient was referred to [facility]. Leone Haven at [facility]. He was concerned that this might be a neoplastic process, possibly lymphoma or cystic  pancreatic neoplasm., Patient initially refused workup other than MRI. She is unable to get an MRI because of the vascular clips from previous aneurysm surgery. She is now agreeable to biopsy or aspiration.  I suspect Patient has a neoplastic process, and hemorrhaged into the area. Her hemoglobin has dropped. She was not on anticoagulation as an outpatient. She was on DVT prophylaxis initially which was stopped when the CAT scan results were obtained. I will check a CA 19-9 and CEA. That patient would not be a surgical candidate in her current condition especially with regard to her pulmonary status. Diagnosis for prognostic reasons would be helpful in this lady. She reports that she would not want hospice currently, and would not want surgery if at all possible. She likely would be a poor candidate for chemotherapy as well, do to her multiple medical issues and poor functional status. More distended today, which is concerning for continued hemorrhage. Her blood pressure in hemoglobin however are stable since yesterday. Patient wanted to be full code, but this will likely need to be readdressed once diagnosis is more firm.   Anemia, acute on chronic. Likely secondary to enlarging intra-abdominal hematoma.  patient is just now getting her transfusion.    HTN (hypertension)   Hyponatremia, likely SIADH. 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  Spiculated lung nodule: This has been followed by her oncologist at [facility], Dr. Alveda Reasons. Druscilla Brownie, and her surgeon, Dr. Daisey Must. It was  felt to be a slow growing malignancy. Patient was offered resection if she would quit smoking and participate in pulmonary rehabilitation. This never occurred, so they've been just monitoring the nodule with serial scans.   Hypothyroidism: TSH is normal  Leukocytosis likely stress demargination from steroids.  20 pound weight loss in the past few months is concerning.  Continued tobacco abuse  Patient is interested in transferring her cancer care to Dr. Laurie Panda. I have consulted him. Hopefully, he can also shed some light on this intra-abdominal process in this complicated and puzzling case.   LOS: 4 days   Yasseen Salls L 11/02/2012, 5:24 PM

## 2012-11-02 NOTE — Progress Notes (Signed)
This writer called lab to see if blood was ready, per lab blood has not arrived from [facility]. Lab states will call unit when blood arrives, patient and doctor informed.

## 2012-11-02 NOTE — Progress Notes (Signed)
Utilization Review Complete  

## 2012-11-02 NOTE — Care Management Note (Unsigned)
    Page 1 of 1   11/02/2012     1:20:50 PM   CARE MANAGEMENT NOTE 11/02/2012  Patient:  Tracey Martinez, Tracey Martinez   Account Number:  [id number]  Date Initiated:  11/02/2012  Documentation initiated by:  Rosemary Holms  Subjective/Objective Assessment:   Pt admitted from home where he lives with his wife. Admitted with PNA and acute on chronic Resp. Failure. Spoke to pt and wife. Would benefit from [facility] RN and Aid. Has walker at home     Action/Plan:   Anticipated DC Date:  11/03/2012   Anticipated DC Plan:  HOME W HOME HEALTH SERVICES      DC Planning Services  CM consult      [facility] Choice  HOME HEALTH   Choice offered to / List presented to:  C-1 Patient        HH arranged  HH-1 RN  HH-10 DISEASE MANAGEMENT  HH-4 NURSE'S AIDE      HH agency  [facility]   Status of service:  In process, will continue to follow Medicare Important Message given?   (If response is "NO", the following Medicare IM given date fields will be blank) Date Medicare IM given:   Date Additional Medicare IM given:    Discharge Disposition:    Per UR Regulation:    If discussed at Long Length of Stay Meetings, dates discussed:    Comments:  11/02/12 1030 Tiandra Swoveland RN [facility] CM

## 2012-11-02 NOTE — Clinical Documentation Improvement (Signed)
MALNUTRITION DOCUMENTATION CLARIFICATION  THIS DOCUMENT IS NOT A PERMANENT PART OF THE MEDICAL RECORD  TO RESPOND TO THE THIS QUERY, FOLLOW THE INSTRUCTIONS BELOW:  1. If needed, update documentation for the patient's encounter via the notes activity.  2. Access this query again and click edit on the In [employer].  3. After updating, or not, click F2 to complete all highlighted (required) fields concerning your review. Select "additional documentation in the medical record" OR "no additional documentation provided".  4. Click Sign note button.  5. The deficiency will fall out of your In Basket *Please let us know if you are not able to complete this workflow by phone or e-mail (listed below).  Please update your documentation within the medical record to reflect your response to this query.                                                                                        11/02/12   Dear Dr. Lendell Caprice / Associates,  In a better effort to capture your patient's severity of illness, reflect appropriate length of stay and utilization of resources, a review of the patient medical record has revealed the following indicators.  Based on your clinical judgment, please clarify and document in a progress note and/or discharge summary the clinical condition associated with the following supporting information:  In responding to this query please exercise your independent judgment.  The fact that a query is asked, does not imply that any particular answer is desired or expected.  Please clarify nutritional status  Possible Clinical Conditions?  Mild Malnutrition  Moderate Malnutrition Severe Malnutrition   Protein Calorie Malnutrition Severe Protein Calorie Malnutrition Other Condition________________ Cannot clinically determine  Clinical Information:  Risk Factors: Per RD: "Pt meets criteria for Severe MALNUTRITION in the context of acute illness as evidenced by non-volitional wt loss  of >10% in 6 months, energy intake </=75% for/= 1 month".   Signs & Symptoms: -Ht: 5'7" Wt: 130lbs -BMI: 20.44 -Weight Loss 20lbs over 6 months  -Diagnostics: -Albumin level: 2.3 -Total Protein: 6.6 -Calcium level: 8.8  Treatments: Ensure pudding tid with meals Regular diet Monitor I&O  -Nutrition Consult:NUTRITION DIAGNOSIS: Inadequate oral intake related to poor appetite as evidenced by diet hx and observed meal intake <50%, wt loss of 20# x 5 months MONITORING/EVALUATION(Goals): Monitor meals and supplement intake, wt trends Goal: Pt to meet >/= 90% of their estimated nutrition needs; not met INTERVENTION: Add Resource Breeze po TID, each supplement provides 250 kcal and 9 grams of protein.     You may use possible, probable, or suspect with inpatient documentation. possible, probable, suspected diagnoses MUST be documented at the time of discharge  Reviewed: additional documentation in the medical record  Thank You,  Debora T Williams  Clinical Documentation Specialist:  Pager  Health Information Management [place]

## 2012-11-03 ENCOUNTER — Ambulatory Visit (HOSPITAL_COMMUNITY)
Admit: 2012-11-03 | Discharge: 2012-11-03 | Disposition: A | Discharge status: Home / Self Care | Payer: Medicare Other | Attending: Radiology | Admitting: Radiology

## 2012-11-03 ENCOUNTER — Inpatient Hospital Stay (HOSPITAL_COMMUNITY)
Admit: 2012-11-03 | Discharge: 2012-11-03 | Disposition: A | Discharge status: Home / Self Care | Payer: Medicare Other | Attending: Radiology | Admitting: Radiology

## 2012-11-03 ENCOUNTER — Ambulatory Visit (HOSPITAL_COMMUNITY): Admit: 2012-11-03 | Payer: Medicare Other

## 2012-11-03 DIAGNOSIS — R0609 Other forms of dyspnea: Secondary | ICD-10-CM

## 2012-11-03 DIAGNOSIS — R0989 Other specified symptoms and signs involving the circulatory and respiratory systems: Secondary | ICD-10-CM

## 2012-11-03 DIAGNOSIS — J90 Pleural effusion, not elsewhere classified: Secondary | ICD-10-CM

## 2012-11-03 DIAGNOSIS — R222 Localized swelling, mass and lump, trunk: Secondary | ICD-10-CM

## 2012-11-03 DIAGNOSIS — J449 Chronic obstructive pulmonary disease, unspecified: Secondary | ICD-10-CM

## 2012-11-03 LAB — CULTURE, BLOOD (ROUTINE X 2)
Culture: NO GROWTH
Culture: NO GROWTH

## 2012-11-03 LAB — TYPE AND SCREEN
DAT, IgG: NEGATIVE
Donor AG Type: NEGATIVE

## 2012-11-03 LAB — BASIC METABOLIC PANEL
Calcium: 9 mg/dL (ref 8.4–10.5)
Creatinine, Ser: 0.56 mg/dL (ref 0.50–1.10)
GFR calc Af Amer: >90 mL/min (ref >90)

## 2012-11-03 LAB — PROTIME-INR: INR: 1.55 — ABNORMAL HIGH (ref 0.00–1.49)

## 2012-11-03 LAB — GLUCOSE, CAPILLARY: Glucose-Capillary: 94 mg/dL (ref 70–99)

## 2012-11-03 LAB — CBC
Platelets: 428 10*3/uL — ABNORMAL HIGH (ref 150–400)
RDW: 17.7 % — ABNORMAL HIGH (ref 11.5–15.5)
WBC: 16.5 10*3/uL — ABNORMAL HIGH (ref 4.0–10.5)

## 2012-11-03 LAB — APTT: aPTT: 37 seconds (ref 24–37)

## 2012-11-03 MED ORDER — MIDAZOLAM HCL 2 MG/2ML IJ SOLN
INTRAMUSCULAR | Status: AC | PRN
  Administered 2012-11-03: 1 mg via INTRAVENOUS

## 2012-11-03 MED ORDER — PREDNISONE 10 MG PO TABS
10.0000 mg | ORAL_TABLET | Freq: Every day | ORAL | Status: DC
  Administered 2012-11-04 – 2012-11-05 (×2): 10 mg via ORAL
  Filled 2012-11-03 (×2): qty 1

## 2012-11-03 MED ORDER — FENTANYL CITRATE 0.05 MG/ML IJ SOLN
INTRAMUSCULAR | Status: AC | PRN
  Administered 2012-11-03: 50 ug via INTRAVENOUS

## 2012-11-03 NOTE — Progress Notes (Signed)
Patient ID: Tracey Martinez, female   DOB: 1945-12-29, 66 y.o.   MRN: [id number] Request received for aspiration of an enlarging multiloculated cystic collection in the subcapsular space of the left kidney. Pt has remote hx of lung cancer. Imaging studies were reviewed by Dr. Grace Isaac. Additional PMH as below. Exam: pt awake/alert; chest- few rhonchi, dim BS left base; heart- RRR; abd- soft, mild-mod distended, mild diffuse tenderness to palpation; ext- FROM, no edema.    Filed Vitals:   11/02/12 2253 11/03/12 0510 11/03/12 0546 11/03/12 0802  BP: 144/82 143/92    Pulse: 99 95    Temp: 98.2 F (36.8 C) 98.1 F (36.7 C)    TempSrc: Oral Oral    Resp: 20 19    Height:      Weight:      SpO2: 100% 100% 100% 96%   Past Medical History  Diagnosis Date  . Aneurysm     brain  . GERD (gastroesophageal reflux disease)   . Hypertension   . Anxiety   . Oxygen dependent     requires oxygen for at least 16 hours per day,   . Hypothyroidism   . COPD (chronic obstructive pulmonary disease)     on home oxygen  . Anemia     iron deficiency anemia  . Gastric erosions     causing anemia  . Chronic respiratory failure   . Anemia   . Gastric ulcer   . Borderline diabetes   . Cancer   . Lung cancer    Past Surgical History  Procedure Date  . Lung removal, partial   . Abdominal hysterectomy   . Esophagogastroduodenoscopy 05/11/2012    Procedure: ESOPHAGOGASTRODUODENOSCOPY (EGD);  Surgeon: Malissa Hippo, MD;  Location: [facility];  Service: Endoscopy;  Laterality: N/A;  . Cataract extraction, bilateral    Dg Skull 1-3 Views  11/01/2012  *RADIOLOGY REPORT*  Clinical Data: Pre MRI.  SKULL - 1-3 VIEW  Comparison: None.  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  Interventricular shunt is in place.  No acute abnormality. Prominent hyperostosis frontalis interna.  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   This is a contraindication to MRI.   Original Report Authenticated By: Francene Boyers, M.D.    Ct Chest W Contrast  10/30/2012  *RADIOLOGY REPORT*  Clinical Data: History of lung cancer with weight loss.  Follow-up pulmonary nodule.  CT CHEST WITH CONTRAST  Technique:  Multidetector CT imaging of the chest was performed following the standard protocol during bolus administration of intravenous contrast.  Contrast: 80mL OMNIPAQUE IOHEXOL 300 MG/ML  SOLN  Comparison: Chest CT 04/22/2012.  Abdominal pelvic CT 07/28/2012.  Findings: There is stable volume loss in the right hemithorax status post right upper lobe resection.  The spiculated right lower lobe nodule has not significantly changed, measuring 2.0 x 1.5 cm on image 38. A 4 mm retrosternal nodule in the right middle lobe on image 26 is unchanged.  The scarring superior to the aortic arch in the left upper lobe has improved.  There is no evidence of focal mass in this area.  There is new left lower lobe atelectasis adjacent to an enlarging left pleural effusion. Emphysematous changes are again noted.  The mediastinum has a stable appearance with aortic atherosclerosis and central enlargement of the pulmonary arteries.  There are no enlarged mediastinal or hilar lymph nodes.  There is a  moderate sized dependent left pleural effusion.  No significant pleural fluid is seen on the right.  There is no pericardial effusion.  Images through the upper abdomen demonstrate an enlarging perinephric fluid collection on the left with increased mass effect on the left kidney.  This is incompletely imaged, but measures up to 8.1 x 6.1 cm transverse on image #6 of series 8.  There is a new complex splenic subcapsular fluid collection with intermediate and low density components.  The low density component measures 8.3 x 5.7 cm transverse on image 47.  The higher density component beneath the left hemidiaphragm extends anteriorly and is suspicious for a subcapsular hematoma or  infection.  There is an abnormal density inferior to the left hepatic lobe and adjacent to the stomach which does not appear to reflect the previously demonstrated redundant transverse colon.  Based on the reformatted images, this appears contiguous with the splenic process and is suspicious for extracapsular extension.  There is persistent colonic wall thickening at the hepatic flexure.  IMPRESSION:  1.  No significant change in spiculated right lower lobe mass remaining very concerning for bronchogenic carcinoma based on morphology. 2.  No other evidence of intrathoracic malignancy status post right upper lobe resection. 3.  Moderate sized left pleural effusion with associated left lower lobe atelectasis.  This pleural fluid is likely related to the progressive subphrenic process. 4. Enlarging left perinephric fluid collection with new left subphrenic fluid collections, likely representing splenic subcapsular hematoma or abscess.  There is concern of extracapsular extension of this process anteriorly between the stomach and left hepatic lobe.  Percutaneous aspiration/drainage should be considered.  These results were called by telephone on 10/30/2012 at 1005 hours to Dr. Lendell Caprice, who verbally acknowledged these results.   Original Report Authenticated By: Carey Bullocks, M.D.    Ct Abdomen W Contrast  10/31/2012  *RADIOLOGY REPORT*  Clinical Data: Evaluate complex left upper quadrant fluid collection  CT ABDOMEN WITH CONTRAST  Technique:  Multidetector CT imaging of the abdomen was performed following the standard protocol during bolus administration of intravenous contrast.  Contrast: OMNIPAQUE IOHEXOL 300 MG/ML  SOLN  Comparison: CT scan of the chest 10/30/2012; CT scan abdomen/pelvis 07/28/2012  Findings:  Lower Chest:  No significant interval change in the appearance of the moderate left layering pleural effusion and left lower lobe atelectasis.  Additionally, the previously identified spiculated  nodule in the periphery of the right lower lobe is also unchanged at 2 x 1.5 cm. Stable visualized cardiac and mediastinal structures.  No pericardial effusion.  Abdomen: Interval development of a complex fluid collection in the left subdiaphragmatic space which measures 10 x 9.2 x 6.3 cm in greatest transverse dimension (AP by CC by TR).  This fluid collection is intimately associated with the upper pole of the spleen displacement inferiorly and exerting mass effect on the cortex.  This fluid collection is likely within the subcapsular space.  There is a well-defined attenuation differential within the collection.  The cephalad aspect of the collection is water attenuation while inferiorly the collection is of intermediate attenuation ranging from 49-64.  The collection appears to extend anteriorly between the left hepatic lobe and over the fundus of the stomach and then inferiorly into the gastrohepatic ligament.  This portion of the collection is all of relatively high attenuation.  Interval enlargement of complex multiloculated subcapsular fluid collection about the left kidney. Measuring along similar landmarks as a prior study, the anterior collection measures 6.8 x 5.9 cm compared  3.0 x 2.1 cm previously at the upper pole of the kidney, and 6.1 x 4.2 cm compared to 3.3 x 1.6 cm in the interpolar region. The collections are low attenuation centrally but demonstrate a thick enhancing capsule.  The collection does abut the inferior margin of the complex cystic collection in the subdiaphragmatic space.  The tail of the pancreas is also interposition between these two complex subcapsular fluid collections and cannot be separated from them. High attenuation material layering within the gallbladder consistent with sludge and small stones.  No evidence of bowel obstruction.  Left adrenal gland is displaced by the subdiaphragmatic fluid collection.  The right adrenal gland is within normal limits.  Right kidney is  within normal limits.  Bones: No acute fracture or aggressive appearing lytic or blastic osseous lesion.  Vascular: Scattered atherosclerotic vascular calcification.  No evidence of active extravasation of contrast on the delayed images.  IMPRESSION:  1.  Interval development of a large, complex left subdiaphragmatic fluid collection the epicenter of which appears to be arising from the subcapsular space of the spleen.  The fluid collection demonstrates clear fluid-fluid levels with high attenuation layering inferiorly and extending anteriorly over the gastric fundus and into the gastrohepatic ligament.  The high attenuation may represent hematoma and / or enhancing soft tissue.  This complex fluid collection abuts an enlarging multiloculated cystic collection in the subcapsular space of the left kidney.  The pancreatic tail is sandwich between these two complex collections and is difficult to separate from the collections.  The findings are most consistent with a very complex multiloculated predominately cystic mass with areas of likely hemorrhage/hematoma and/or enhancing soft tissue.  Differential considerations include a mucinous cystic neoplasm of the pancreas with superimposed hemorrhage, a hemorrhagic pancreatic pseudocyst, and less likely (given the patients improving clinical status) an abscess collection with a hemorrhagic component.  A mass lesion such as a neoplasm, or potentially a hemorrhagic pseudocyst is favored given the degree of mass effect on the surrounding structures.  Consider MRI with and without contrast to evaluate for an enhancing soft tissue complement which would favor a diagnosis of neoplasm. If the patient begins to develop signs and symptoms of infection/sepsis, CT guided aspiration could be performed to evaluate the fluid content of the more cystic portions of the complex collection.  I would hesitate to place a drain in these collections until neoplasm is ruled out to prevent  seeding of the draining tract.  Importantly, there is no evidence of active bleeding on the delayed phase images.  These results and recommendations were called by telephone on 10/31/2012 at approximately 02:00 p.m. to Dr. Lendell Caprice, who verbally acknowledged these results.   Original Report Authenticated By: Malachy Moan, M.D.    Dg Chest Port 1 View  10/29/2012  *RADIOLOGY REPORT*  Clinical Data: Cough, CHF, infiltrate  PORTABLE CHEST - 1 VIEW  Comparison: 04/22/2012; chest CT - 04/22/2012  Findings:  Unchanged enlarged cardiac silhouette and mediastinal contours. Atherosclerotic calcifications within the thoracic aorta.  Stable postsurgical change of the right lung and right hemithorax.  Left basilar heterogeneous air space opacities.  No focal right-sided airspace opacities.  Grossly unchanged nodular, slightly spiculated opacity overlying the peripheral aspect of the right lung corresponding to the nodule seen on recent chest CT.  Grossly unchanged bones including extensive postsurgical change of the superior aspect of the right thorax.  Ventriculoperitoneal tubing courses along the midline of the left hemithorax.  IMPRESSION: 1.  Left basilar heterogeneous possible air space  opacities worrisome for infection. A follow-up chest radiograph in 4 to 6 weeks after treatment is recommended to ensure resolution. 2.  Grossly unchanged nodular opacity overlying the peripheral aspect of the right lower lung as demonstrated on recent chest CT. This nodule again remains concerning for malignancy.   Original Report Authenticated By: Tacey Ruiz, MD   Results for orders placed during the hospital encounter of 10/29/12  BASIC METABOLIC PANEL      Component Value Range   Sodium 124 (*) 135 - 145 mEq/L   Potassium 4.2  3.5 - 5.1 mEq/L   Chloride 85 (*) 96 - 112 mEq/L   CO2 27  19 - 32 mEq/L   Glucose, Bld 212 (*) 70 - 99 mg/dL   BUN 12  6 - 23 mg/dL   Creatinine, Ser 4.09  0.50 - 1.10 mg/dL   Calcium 9.4   8.4 - 81.1 mg/dL   GFR calc non Af Amer 89 (*) >90 mL/min   GFR calc Af Amer >90  >90 mL/min  CBC WITH DIFFERENTIAL      Component Value Range   WBC 12.6 (*) 4.0 - 10.5 K/uL   RBC 3.78 (*) 3.87 - 5.11 MIL/uL   Hemoglobin 9.6 (*) 12.0 - 15.0 g/dL   HCT 91.4 (*) 78.2 - 95.6 %   MCV 77.8 (*) 78.0 - 100.0 fL   MCH 25.4 (*) 26.0 - 34.0 pg   MCHC 32.7  30.0 - 36.0 g/dL   RDW 21.3 (*) 08.6 - 57.8 %   Platelets 476 (*) 150 - 400 K/uL   Neutrophils Relative 84 (*) 43 - 77 %   Neutro Abs 10.5 (*) 1.7 - 7.7 K/uL   Lymphocytes Relative 10 (*) 12 - 46 %   Lymphs Abs 1.2  0.7 - 4.0 K/uL   Monocytes Relative 7  3 - 12 %   Monocytes Absolute 0.8  0.1 - 1.0 K/uL   Eosinophils Relative 0  0 - 5 %   Eosinophils Absolute 0.0  0.0 - 0.7 K/uL   Basophils Relative 0  0 - 1 %   Basophils Absolute 0.0  0.0 - 0.1 K/uL  TROPONIN I      Component Value Range   Troponin I <0.30  <0.30 ng/mL  LACTIC ACID, PLASMA      Component Value Range   Lactic Acid, Venous 3.3 (*) 0.5 - 2.2 mmol/L  URINE CULTURE      Component Value Range   Specimen Description URINE, CATHETERIZED     Special Requests NONE     Culture  Setup Time 10/29/2012 12:10     Colony Count NO GROWTH     Culture NO GROWTH     Report Status 10/30/2012 FINAL    CULTURE, BLOOD (ROUTINE X 2)      Component Value Range   Specimen Description Blood LEFT HAND     Special Requests NONE 6CC     Culture NO GROWTH 4 DAYS     Report Status PENDING    CULTURE, BLOOD (ROUTINE X 2)      Component Value Range   Specimen Description Blood RIGHT ASSIST CONTROL     Special Requests NONE 6CC     Culture NO GROWTH 4 DAYS     Report Status PENDING    BLOOD GAS, ARTERIAL      Component Value Range   FIO2 60.00     Delivery systems BILEVEL POSITIVE AIRWAY PRESSURE     Inspiratory PAP 18  Expiratory PAP 8     pH, Arterial 7.363  7.350 - 7.450   pCO2 arterial 45.2 (*) 35.0 - 45.0 mmHg   pO2, Arterial 153.0 (*) 80.0 - 100.0 mmHg   Bicarbonate 25.1 (*)  20.0 - 24.0 mEq/L   TCO2 23.8  0 - 100 mmol/L   Acid-Base Excess 0.4  0.0 - 2.0 mmol/L   O2 Saturation 99.6     Collection site RIGHT BRACHIAL     Drawn by COLLECTED BY RT     Sample type ARTERIAL     Allens test (pass/fail) PASS  PASS  URINALYSIS, MICROSCOPIC ONLY      Component Value Range   Color, Urine AMBER (*) YELLOW   APPearance CLEAR  CLEAR   Specific Gravity, Urine 1.025  1.005 - 1.030   pH 5.5  5.0 - 8.0   Glucose, UA NEGATIVE  NEGATIVE mg/dL   Hgb urine dipstick NEGATIVE  NEGATIVE   Bilirubin Urine SMALL (*) NEGATIVE   Ketones, ur NEGATIVE  NEGATIVE mg/dL   Protein, ur 30 (*) NEGATIVE mg/dL   Urobilinogen, UA 0.2  0.0 - 1.0 mg/dL   Nitrite NEGATIVE  NEGATIVE   Leukocytes, UA NEGATIVE  NEGATIVE   WBC, UA 7-10  <3 WBC/hpf   RBC / HPF 0-2  <3 RBC/hpf   Bacteria, UA FEW (*) RARE  HEPATIC FUNCTION PANEL      Component Value Range   Total Protein 6.6  6.0 - 8.3 g/dL   Albumin 2.3 (*) 3.5 - 5.2 g/dL   AST 12  0 - 37 U/L   ALT 9  0 - 35 U/L   Alkaline Phosphatase 117  39 - 117 U/L   Total Bilirubin 0.3  0.3 - 1.2 mg/dL   Bilirubin, Direct 0.2  0.0 - 0.3 mg/dL   Indirect Bilirubin 0.1 (*) 0.3 - 0.9 mg/dL  PROCALCITONIN      Component Value Range   Procalcitonin 1.67    HEMOGLOBIN A1C      Component Value Range   Hemoglobin A1C 6.6 (*) <5.7 %   Mean Plasma Glucose 143 (*) <117 mg/dL  FERRITIN      Component Value Range   Ferritin 280  10 - 291 ng/mL  HIV ANTIBODY (ROUTINE TESTING)      Component Value Range   HIV NON REACTIVE  NON REACTIVE  TSH      Component Value Range   TSH 2.173  0.350 - 4.500 uIU/mL  THEOPHYLLINE LEVEL      Component Value Range   Theophylline Lvl 3.2 (*) 10.0 - 20.0 ug/mL  LEGIONELLA ANTIGEN, URINE      Component Value Range   Specimen Description URINE, RANDOM     Special Requests NONE     Legionella Antigen, Urine Negative for Legionella pneumophilia serogroup 1     Report Status 10/30/2012 FINAL    STREP PNEUMONIAE URINARY ANTIGEN       Component Value Range   Strep Pneumo Urinary Antigen NEGATIVE  NEGATIVE  MRSA PCR SCREENING      Component Value Range   MRSA by PCR NEGATIVE  NEGATIVE  GLUCOSE, CAPILLARY      Component Value Range   Glucose-Capillary 176 (*) 70 - 99 mg/dL   Comment 1 Documented in Chart     Comment 2 Notify RN    BASIC METABOLIC PANEL      Component Value Range   Sodium 125 (*) 135 - 145 mEq/L   Potassium 4.5  3.5 -  5.1 mEq/L   Chloride 87 (*) 96 - 112 mEq/L   CO2 26  19 - 32 mEq/L   Glucose, Bld 129 (*) 70 - 99 mg/dL   BUN 24 (*) 6 - 23 mg/dL   Creatinine, Ser 1.61  0.50 - 1.10 mg/dL   Calcium 8.8  8.4 - 09.6 mg/dL   GFR calc non Af Amer 60 (*) >90 mL/min   GFR calc Af Amer 69 (*) >90 mL/min  GLUCOSE, CAPILLARY      Component Value Range   Glucose-Capillary 123 (*) 70 - 99 mg/dL   Comment 1 Documented in Chart     Comment 2 Notify RN    GLUCOSE, CAPILLARY      Component Value Range   Glucose-Capillary 120 (*) 70 - 99 mg/dL  OSMOLALITY, URINE      Component Value Range   Osmolality, Ur 516  390 - 1090 mOsm/kg  OSMOLALITY      Component Value Range   Osmolality 272 (*) 275 - 300 mOsm/kg  CBC      Component Value Range   WBC 21.4 (*) 4.0 - 10.5 K/uL   RBC 3.11 (*) 3.87 - 5.11 MIL/uL   Hemoglobin 8.0 (*) 12.0 - 15.0 g/dL   HCT 04.5 (*) 40.9 - 81.1 %   MCV 78.1  78.0 - 100.0 fL   MCH 25.7 (*) 26.0 - 34.0 pg   MCHC 32.9  30.0 - 36.0 g/dL   RDW 91.4 (*) 78.2 - 95.6 %   Platelets 500 (*) 150 - 400 K/uL  GLUCOSE, CAPILLARY      Component Value Range   Glucose-Capillary 133 (*) 70 - 99 mg/dL  BASIC METABOLIC PANEL      Component Value Range   Sodium 127 (*) 135 - 145 mEq/L   Potassium 4.8  3.5 - 5.1 mEq/L   Chloride 89 (*) 96 - 112 mEq/L   CO2 28  19 - 32 mEq/L   Glucose, Bld 132 (*) 70 - 99 mg/dL   BUN 22  6 - 23 mg/dL   Creatinine, Ser 2.13  0.50 - 1.10 mg/dL   Calcium 8.8  8.4 - 08.6 mg/dL   GFR calc non Af Amer 86 (*) >90 mL/min   GFR calc Af Amer >90  >90 mL/min   HEMOGLOBIN AND HEMATOCRIT, BLOOD      Component Value Range   Hemoglobin 7.6 (*) 12.0 - 15.0 g/dL   HCT 57.8 (*) 46.9 - 62.9 %  GLUCOSE, CAPILLARY      Component Value Range   Glucose-Capillary 128 (*) 70 - 99 mg/dL   Comment 1 Notify RN    BASIC METABOLIC PANEL      Component Value Range   Sodium 127 (*) 135 - 145 mEq/L   Potassium 4.6  3.5 - 5.1 mEq/L   Chloride 90 (*) 96 - 112 mEq/L   CO2 30  19 - 32 mEq/L   Glucose, Bld 96  70 - 99 mg/dL   BUN 11  6 - 23 mg/dL   Creatinine, Ser 5.28  0.50 - 1.10 mg/dL   Calcium 8.9  8.4 - 41.3 mg/dL   GFR calc non Af Amer >90  >90 mL/min   GFR calc Af Amer >90  >90 mL/min  CBC WITH DIFFERENTIAL      Component Value Range   WBC 20.2 (*) 4.0 - 10.5 K/uL   RBC 2.76 (*) 3.87 - 5.11 MIL/uL   Hemoglobin 7.0 (*) 12.0 - 15.0 g/dL  HCT 21.0 (*) 36.0 - 46.0 %   MCV 76.1 (*) 78.0 - 100.0 fL   MCH 25.4 (*) 26.0 - 34.0 pg   MCHC 33.3  30.0 - 36.0 g/dL   RDW 81.1 (*) 91.4 - 78.2 %   Platelets 491 (*) 150 - 400 K/uL   Neutrophils Relative 91 (*) 43 - 77 %   Neutro Abs 18.3 (*) 1.7 - 7.7 K/uL   Lymphocytes Relative 4 (*) 12 - 46 %   Lymphs Abs 0.8  0.7 - 4.0 K/uL   Monocytes Relative 5  3 - 12 %   Monocytes Absolute 1.1 (*) 0.1 - 1.0 K/uL   Eosinophils Relative 0  0 - 5 %   Eosinophils Absolute 0.0  0.0 - 0.7 K/uL   Basophils Relative 0  0 - 1 %   Basophils Absolute 0.0  0.0 - 0.1 K/uL  TYPE AND SCREEN      Component Value Range   ABO/RH(D) A POS     Antibody Screen POS     Sample Expiration 11/04/2012     Antibody Identification ANTI-E ANTI-c     PT AG Type NEGATIVE FOR E ANTIGEN NEGATIVE FOR c ANTIGEN     DAT, IgG NEG     Unit Number [id number]     Blood Component Type RED CELLS,LR     Unit division 00     Status of Unit REL FROM [facility]     Donor AG Type NEGATIVE FOR E ANTIGEN     Transfusion Status OK TO TRANSFUSE     Crossmatch Result COMPATIBLE     Unit Number [id number]     Blood Component Type RBC LR PHER1     Unit  division 00     Status of Unit ISSUED,FINAL     Donor AG Type NEGATIVE FOR E ANTIGEN NEGATIVE FOR c ANTIGEN     Transfusion Status OK TO TRANSFUSE     Crossmatch Result COMPATIBLE    GLUCOSE, CAPILLARY      Component Value Range   Glucose-Capillary 112 (*) 70 - 99 mg/dL  PREPARE RBC (CROSSMATCH)      Component Value Range   Order Confirmation ORDER PROCESSED BY BLOOD BANK    CBC      Component Value Range   WBC 17.1 (*) 4.0 - 10.5 K/uL   RBC 2.96 (*) 3.87 - 5.11 MIL/uL   Hemoglobin 7.5 (*) 12.0 - 15.0 g/dL   HCT 40.1 (*) 02.7 - 25.3 %   MCV 76.7 (*) 78.0 - 100.0 fL   MCH 25.3 (*) 26.0 - 34.0 pg   MCHC 33.0  30.0 - 36.0 g/dL   RDW 66.4 (*) 40.3 - 47.4 %   Platelets 492 (*) 150 - 400 K/uL  GLUCOSE, CAPILLARY      Component Value Range   Glucose-Capillary 95  70 - 99 mg/dL  CBC      Component Value Range   WBC 16.5 (*) 4.0 - 10.5 K/uL   RBC 3.34 (*) 3.87 - 5.11 MIL/uL   Hemoglobin 8.7 (*) 12.0 - 15.0 g/dL   HCT 25.9 (*) 56.3 - 87.5 %   MCV 78.1  78.0 - 100.0 fL   MCH 26.0  26.0 - 34.0 pg   MCHC 33.3  30.0 - 36.0 g/dL   RDW 64.3 (*) 32.9 - 51.8 %   Platelets 428 (*) 150 - 400 K/uL  PROTIME-INR      Component Value Range   Prothrombin Time 18.1 (*)  11.6 - 15.2 seconds   INR 1.55 (*) 0.00 - 1.49  APTT      Component Value Range   aPTT 37  24 - 37 seconds  BASIC METABOLIC PANEL      Component Value Range   Sodium 131 (*) 135 - 145 mEq/L   Potassium 4.0  3.5 - 5.1 mEq/L   Chloride 92 (*) 96 - 112 mEq/L   CO2 31  19 - 32 mEq/L   Glucose, Bld 97  70 - 99 mg/dL   BUN 9  6 - 23 mg/dL   Creatinine, Ser 1.19  0.50 - 1.10 mg/dL   Calcium 9.0  8.4 - 14.7 mg/dL   GFR calc non Af Amer >90  >90 mL/min   GFR calc Af Amer >90  >90 mL/min  GLUCOSE, CAPILLARY      Component Value Range   Glucose-Capillary 94  70 - 99 mg/dL   Comment 1 Notify RN     A/P: Pt with remote history of lung cancer and recent CT abdomen revealing  enlarging left subdiaphragmatic and left perinephric  complex multiloculated cystic fluid collections. Plan is for US/CT guided aspiration of the left perinephric fluid collection today. Details/risks of procedure d/w pt with her understanding and consent.

## 2012-11-03 NOTE — Progress Notes (Addendum)
Subjective: This lady feels somewhat better today, with better appetite. Her abdomen is somewhat swollen today. She tells me that she has lost 20-30 pounds in the last 2-3 months, unintentionally. She also tells me her abdomen has become more distended but this also tends to fluctuate. There is no significant abdominal pain. Her bowels have not particularly changed recently.           Physical Exam: Blood pressure 143/92, pulse 95, temperature 98.1 F (36.7 C), temperature source Oral, resp. rate 19, height 5\' 7"  (1.702 m), weight 61.6 kg (135 lb 12.9 oz), SpO2 96.00%. She looks cachectic. There is no clubbing. Lung fields show bilateral coarse rhonchi bilaterally. Abdomen is distended, there are no specific masses I can feel. There is no hepatosplenomegaly clinically. She is alert and orientated. There is no supraclavicular lymphadenopathy.   Investigations:  Recent Results (from the past 240 hour(s))  CULTURE, BLOOD (ROUTINE X 2)     Status: Normal   Collection Time   10/29/12  9:03 AM      Component Value Range Status Comment   Specimen Description BLOOD LEFT HAND   Final    Special Requests BOTTLES DRAWN AEROBIC ONLY 6CC   Final    Culture NO GROWTH 5 DAYS   Final    Report Status 11/03/2012 FINAL   Final   CULTURE, BLOOD (ROUTINE X 2)     Status: Normal   Collection Time   10/29/12  9:04 AM      Component Value Range Status Comment   Specimen Description BLOOD RIGHT ANTECUBITAL   Final    Special Requests BOTTLES DRAWN AEROBIC AND ANAEROBIC 6CC   Final    Culture NO GROWTH 5 DAYS   Final    Report Status 11/03/2012 FINAL   Final   URINE CULTURE     Status: Normal   Collection Time   10/29/12 11:30 AM      Component Value Range Status Comment   Specimen Description URINE, CATHETERIZED   Final    Special Requests NONE   Final    Culture  Setup Time 10/29/2012 12:10   Final    Colony Count NO GROWTH   Final    Culture NO GROWTH   Final    Report Status  10/30/2012 FINAL   Final   MRSA PCR SCREENING     Status: Normal   Collection Time   10/29/12  1:24 PM      Component Value Range Status Comment   MRSA by PCR NEGATIVE  NEGATIVE Final      Basic Metabolic Panel:  Basename 11/03/12 0536 11/01/12 0644  NA 131* 127*  K 4.0 4.6  CL 92* 90*  CO2 31 30  GLUCOSE 97 96  BUN 9 11  CREATININE 0.56 0.58  CALCIUM 9.0 8.9  MG -- --  PHOS -- --       CBC:  Basename 11/03/12 0536 11/02/12 0524 11/01/12 0644  WBC 16.5* 17.1* --  NEUTROABS -- -- 18.3*  HGB 8.7* 7.5* --  HCT 26.1* 22.7* --  MCV 78.1 76.7* --  PLT 428* 492* --    Dg Skull 1-3 Views  11/01/2012  *RADIOLOGY REPORT*  Clinical Data: Pre MRI.  SKULL - 1-3 VIEW  Comparison: None.  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  Interventricular shunt is in place.  No acute abnormality. Prominent hyperostosis frontalis  interna.  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  This is a contraindication to MRI.   Original Report Authenticated By: Francene Boyers, M.D.       Medications: I have reviewed the patient's current medications.  Impression: 1. COPD exacerbation, improving. 2. Community acquired pneumonia, clinically improved. 3. Anemia of chronic disease, hemoglobin stable today at 8.7. 4. Hyponatremia, improving. Unclear etiology, possibly related to dehydration. 5. Lung cancer seen on CT chest scan. 6. Ongoing tobacco abuse. 7. Hypothyroidism. 8. Large, complex left subdiaphragmatic fluid collection, unclear etiology, status post CT-guided aspiration today. The overall picture is concerning for malignancy. 9. Severe malnutrition.     Plan: 1. Await results of CT guided aspiration. 2. Reduce oral steroids. 3. Monitor hemoglobin closely. Check complete metabolic panel in view of the elevated INR.     LOS: 5 days   Wilson Singer Pager [phone]  11/03/2012, 2:03  PM

## 2012-11-03 NOTE — Consult Note (Signed)
[facility] Consultation Oncology  Name: Tracey Martinez      MRN: [id number]    Location: A319/A319-01  Date: 11/03/2012 Time:5:08 PM   REFERRING PHYSICIAN:  Crista Curb, MD  REASON FOR CONSULT:  Spiculated lung lesion with intra-abdominal process worrisome for malignancy.   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 Daisey Must, tobacco abuse, chronic anemia, hypertension, hypothyroidism.  She presented by EMS to the [facility] with shortness of breath. She was therefore admitted for COPD exacerbation.  Part of the patient's workup included a CT of chest which revealed a spiculated nodule. The full report follows below. The patient reports that this was initially found in 2001 when she was initially treated for her Pancoast tumor. She reports that her oncologist was following this lesion and has not grown since 2001. However, chart review reveals that according to hospital list, the patient was offered resection but this did not occur without explanation. Her oncologist at [facility] is Dr. Alveda ReasonsLloyd Huger. Her surgeon at [facility] is Dr. Daisey Must.  In addition to the patient's CT of chest, a CT of abd/pelvis was performed which  revealed a development of a large, complex left subdiaphragmatic fluid collection, the at the center of which appears to be arising from the subcapsular space of the spleen with findings most consistent with a very complex multiloculated, predominately cystic, mass with areas of likely hemorrhage/hematoma and/or enhancing soft tissue. The full report follows below.  Earlier this morning, the patient went to [facility] for interventional radiology CT-guided aspiration of abdominal  abscess. Pathology from this is pending.  She tolerated the procedure well.  The patient admits to a decreased appetite and 20 pound weight loss in the past 3 months. She reports feeling feverish with chills at times. She also admits to night sweats that are so bad that she thought her ceiling at home was leaking water from a rainstorm (but the patient reports it was not draining outside). Occasionally, she has a change her night clothes but denies having to change her bed sheets.   pending the results of the biopsy, will help guide oncologically recommendations. The patient certainly appears older than her stated age. Her severe, oxygen dependent, COPD will certainly limit therapeutic options, but first, we will see with the biopsy reveals. The patient is agreeable to this plan. She reports that her husband health significantly in her care and therefore she would like [place] to share her medical information with her husband. He is not present at the time. I informed the patient not to be concerned if her husband is not resident because we would be happy to touch base with him in the near future after the patient's biopsy results are reported.  The patient's performance status is an ECoG score of 3.  PAST MEDICAL HISTORY:   Past Medical History  Diagnosis Date  . Aneurysm     brain  . GERD (gastroesophageal reflux disease)   . Hypertension   . Anxiety   . Oxygen dependent     requires oxygen for at least 16 hours per day,   . Hypothyroidism   . COPD (chronic obstructive pulmonary disease)     on home oxygen  .  Anemia     iron deficiency anemia  . Gastric erosions     causing anemia  . Chronic respiratory failure   . Anemia   . Gastric ulcer   . Borderline diabetes   . Cancer   . Lung cancer     ALLERGIES: Allergies  Allergen Reactions  . Sulfa Antibiotics Other (See Comments)    HANDS,FEET AND MOUTH BLISTERING      MEDICATIONS: I have reviewed the patient's current medications.      PAST SURGICAL HISTORY Past Surgical History  Procedure Date  . Lung removal, partial   . Abdominal hysterectomy   . Esophagogastroduodenoscopy 05/11/2012    Procedure: ESOPHAGOGASTRODUODENOSCOPY (EGD);  Surgeon: Malissa Hippo, MD;  Location: [facility];  Service: Endoscopy;  Laterality: N/A;  . Cataract extraction, bilateral     FAMILY HISTORY: Family History  Problem Relation Age of Onset  . Hypertension Mother   . Hypertension Father   . Diabetes Mother   . Cancer Brother   . Diabetes Brother   . Diabetes Brother    The patient reports that one of her brothers passed weight the age of 14 from COPD. Another passed away in his 41s do to cancer that was found in the back. She is unsure what kind of cancer. She has 5 living siblings that are in the area. She has one son who is 11 years old. He is been handicapped since he was diagnosed with recurrent seizures at the age of 2. He was found to have a benign tumor and underwent resection. He is now mentally handicapped. She has one DC's son who passed weight the age of 5 secondary to a Wilm's tumor. She does not have any grown children.  SOCIAL HISTORY: Patient was born in [place], [place] and presently resides in the same town. She graduated high school. She her is retired from [employer]. She's been married for 45 years and lives with her husband and son who is mentally handicapped. She denies any alcohol or illicit drug abuse. She does admit to a "cigarette every once in a [place] Moon."  On further discussion, the patient metastases to 2 cigarettes per week. She has a tobacco abuse history of half to one pack per day x50 years.  PERFORMANCE STATUS: The patient's performance status is 3 - Symptomatic, >50% confined to bed  PHYSICAL EXAM: Most Recent Vital Signs: Blood pressure 135/81, pulse 102, temperature 98.7 F (37.1 C), temperature source Oral, resp. rate 20, height 5\' 7"  (1.702 m), weight 135 lb  12.9 oz (61.6 kg), SpO2 97.00%. General appearance: alert, appears older than stated age and no distress Head: Normocephalic, without obvious abnormality, atraumatic Eyes: negative findings: lids and lashes normal, conjunctivae and sclerae normal and pupils equal, round, reactive to light and accomodation Throat: abnormal findings: dentition: edentulous Neck: no adenopathy and supple, symmetrical, trachea midline Back: negative Lungs: clear to auscultation bilaterally anteriorly Breasts: not performed at this time. Heart: regular rate and rhythm, S1, S2 normal, no murmur, click, rub or gallop Abdomen: abnormal findings:  distended and hypertympanic Extremities: extremities normal, atraumatic, no cyanosis or edema Skin: Skin color, texture, turgor normal. No rashes or lesions Lymph nodes: no cervical, or clavicular lymphadenopathy noted.  Neurologic: Grossly normal  LABORATORY DATA:  Results for orders placed during the hospital encounter of 10/29/12 (from the past 48 hour(s))  CBC     Status: Abnormal   Collection Time   11/02/12  5:24 AM  Component Value Range Comment   WBC 17.1 (*) 4.0 - 10.5 K/uL    RBC 2.96 (*) 3.87 - 5.11 MIL/uL    Hemoglobin 7.5 (*) 12.0 - 15.0 g/dL    HCT 16.1 (*) 09.6 - 46.0 %    MCV 76.7 (*) 78.0 - 100.0 fL    MCH 25.3 (*) 26.0 - 34.0 pg    MCHC 33.0  30.0 - 36.0 g/dL    RDW 04.5 (*) 40.9 - 15.5 %    Platelets 492 (*) 150 - 400 K/uL   GLUCOSE, CAPILLARY     Status: Normal   Collection Time   11/02/12  7:26 AM      Component Value Range Comment   Glucose-Capillary 95  70 - 99 mg/dL   CBC     Status: Abnormal   Collection Time   11/03/12  5:36 AM      Component Value Range Comment   WBC 16.5 (*) 4.0 - 10.5 K/uL    RBC 3.34 (*) 3.87 - 5.11 MIL/uL    Hemoglobin 8.7 (*) 12.0 - 15.0 g/dL    HCT 81.1 (*) 91.4 - 46.0 %    MCV 78.1  78.0 - 100.0 fL    MCH 26.0  26.0 - 34.0 pg    MCHC 33.3  30.0 - 36.0 g/dL    RDW 78.2 (*) 95.6 - 15.5 %    Platelets  428 (*) 150 - 400 K/uL   PROTIME-INR     Status: Abnormal   Collection Time   11/03/12  5:36 AM      Component Value Range Comment   Prothrombin Time 18.1 (*) 11.6 - 15.2 seconds    INR 1.55 (*) 0.00 - 1.49   APTT     Status: Normal   Collection Time   11/03/12  5:36 AM      Component Value Range Comment   aPTT 37  24 - 37 seconds   BASIC METABOLIC PANEL     Status: Abnormal   Collection Time   11/03/12  5:36 AM      Component Value Range Comment   Sodium 131 (*) 135 - 145 mEq/L    Potassium 4.0  3.5 - 5.1 mEq/L    Chloride 92 (*) 96 - 112 mEq/L    CO2 31  19 - 32 mEq/L    Glucose, Bld 97  70 - 99 mg/dL    BUN 9  6 - 23 mg/dL    Creatinine, Ser 2.13  0.50 - 1.10 mg/dL    Calcium 9.0  8.4 - 08.6 mg/dL    GFR calc non Af Amer >90  >90 mL/min    GFR calc Af Amer >90  >90 mL/min   GLUCOSE, CAPILLARY     Status: Normal   Collection Time   11/03/12  5:49 AM      Component Value Range Comment   Glucose-Capillary 94  70 - 99 mg/dL    Comment 1 Notify RN         RADIOGRAPHY: Dg Skull 1-3 Views  11/01/2012  *RADIOLOGY REPORT*  Clinical Data: Pre MRI.  SKULL - 1-3 VIEW  Comparison: None.  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  Interventricular shunt is in place.  No acute abnormality. Prominent hyperostosis frontalis interna.  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  This is a contraindication to MRI.   Original Report  Authenticated By: Francene Boyers, M.D.    Ct Aspiration  11/03/2012  *RADIOLOGY REPORT*  Indication: Intra-abdominal fluid collection, concern for pancreatic pseudocyst  CT GUIDED LEFT PERINEPHRIC FLUID ASPIRATION  Comparison: CT abdomen pelvis - 10/31/2012; 07/28/2012; 07/10/2012  Medications: Fentanyl 50 mcg IV; Versed 1 mg IV  Total Moderate Sedation time: 9 minutes  Contrast: None  Complications: None immediate  Technique / Findings:  Informed written consent was obtained from  the patient after a discussion of the risks, benefits and alternatives to treatment. The patient was placed supine, slightly RPO on the CT gantry and a pre procedural CT was performed re-demonstrating the known abscess/fluid collection adjacent to the anterior-inferior aspect of the left kidney.  The procedure was planned.   A timeout was performed prior to the initiation of the procedure.  The left lateral abdomen was prepped and draped in the usual sterile fashion.   The overlying soft tissues were anesthetized with 1% lidocaine with epinephrine.  Appropriate trajectory was planned with the use of a 22 gauge spinal needle.  A 5-French Yueh sheath needle was advanced into the abscess/fluid collection and approximately 120 ml of serous fluid was aspirated.  A dressing was placed.  The patient tolerated the procedure well without immediate post procedural complication.  Impression:  Successful CT guided aspiration of approximately 120 ml of serous fluid from the left-sided perinephric fluid collection.  A drain was not placed as there is concern this collection may represent a pancreatic pseudocyst.  Samples were sent to the laboratory as requested by the ordering clinical team.  Amylase, lipase and LDH levels were ordered on aspirated specimens.   Original Report Authenticated By: Tacey Ruiz, MD        10/31/2012  *RADIOLOGY REPORT*  Clinical Data: Evaluate complex left upper quadrant fluid  collection  CT ABDOMEN WITH CONTRAST  Technique: Multidetector CT imaging of the abdomen was performed  following the standard protocol during bolus administration of  intravenous contrast.  Contrast: OMNIPAQUE IOHEXOL 300 MG/ML SOLN  Comparison: CT scan of the chest 10/30/2012; CT scan abdomen/pelvis  07/28/2012  Findings:  Lower Chest: No significant interval change in the appearance of  the moderate left layering pleural effusion and left lower lobe  atelectasis. Additionally, the previously  identified spiculated  nodule in the periphery of the right lower lobe is also unchanged  at 2 x 1.5 cm. Stable visualized cardiac and mediastinal  structures. No pericardial effusion.  Abdomen: Interval development of a complex fluid collection in the  left subdiaphragmatic space which measures 10 x 9.2 x 6.3 cm in  greatest transverse dimension (AP by CC by TR). This fluid  collection is intimately associated with the upper pole of the  spleen displacement inferiorly and exerting mass effect on the  cortex. This fluid collection is likely within the subcapsular  space. There is a well-defined attenuation differential within the  collection. The cephalad aspect of the collection is water  attenuation while inferiorly the collection is of intermediate  attenuation ranging from 49-64. The collection appears to extend  anteriorly between the left hepatic lobe and over the fundus of the  stomach and then inferiorly into the gastrohepatic ligament. This  portion of the collection is all of relatively high attenuation.  Interval enlargement of complex multiloculated subcapsular fluid  collection about the left kidney. Measuring along similar landmarks  as a prior study, the anterior collection measures 6.8 x 5.9 cm  compared 3.0 x 2.1 cm previously at the  upper pole of the kidney,  and 6.1 x 4.2 cm compared to 3.3 x 1.6 cm in the interpolar region.  The collections are low attenuation centrally but demonstrate a  thick enhancing capsule. The collection does abut the inferior  margin of the complex cystic collection in the subdiaphragmatic  space. The tail of the pancreas is also interposition between  these two complex subcapsular fluid collections and cannot be  separated from them. High attenuation material layering within the  gallbladder consistent with sludge and small stones. No evidence  of bowel obstruction. Left adrenal gland is displaced by the  subdiaphragmatic fluid collection.  The right adrenal gland is  within normal limits. Right kidney is within normal limits.  Bones: No acute fracture or aggressive appearing lytic or blastic  osseous lesion.  Vascular: Scattered atherosclerotic vascular calcification. No  evidence of active extravasation of contrast on the delayed images.  IMPRESSION:  1. Interval development of a large, complex left subdiaphragmatic  fluid collection the epicenter of which appears to be arising from  the subcapsular space of the spleen. The fluid collection  demonstrates clear fluid-fluid levels with high attenuation  layering inferiorly and extending anteriorly over the gastric  fundus and into the gastrohepatic ligament. The high attenuation  may represent hematoma and / or enhancing soft tissue. This  complex fluid collection abuts an enlarging multiloculated cystic  collection in the subcapsular space of the left kidney. The  pancreatic tail is sandwich between these two complex collections  and is difficult to separate from the collections.  The findings are most consistent with a very complex multiloculated  predominately cystic mass with areas of likely hemorrhage/hematoma  and/or enhancing soft tissue. Differential considerations include  a mucinous cystic neoplasm of the pancreas with superimposed  hemorrhage, a hemorrhagic pancreatic pseudocyst, and less likely  (given the patients improving clinical status) an abscess  collection with a hemorrhagic component. A mass lesion such as a  neoplasm, or potentially a hemorrhagic pseudocyst is favored given  the degree of mass effect on the surrounding structures.  Consider MRI with and without contrast to evaluate for an enhancing  soft tissue complement which would favor a diagnosis of neoplasm.  If the patient begins to develop signs and symptoms of  infection/sepsis, CT guided aspiration could be performed to  evaluate the fluid content of the more cystic portions of the   complex collection. I would hesitate to place a drain in these  collections until neoplasm is ruled out to prevent seeding of the  draining tract.  Importantly, there is no evidence of active bleeding on the delayed  phase images.  These results and recommendations were called by telephone on  10/31/2012 at approximately 02:00 p.m. to Dr. Lendell Caprice, who  verbally acknowledged these results.  Original Report Authenticated By: Malachy Moan, M.D.  10/30/2012  *RADIOLOGY REPORT*  Clinical Data: History of lung cancer with weight loss. Follow-up  pulmonary nodule.  CT CHEST WITH CONTRAST  Technique: Multidetector CT imaging of the chest was performed  following the standard protocol during bolus administration of  intravenous contrast.  Contrast: 80mL OMNIPAQUE IOHEXOL 300 MG/ML SOLN  Comparison: Chest CT 04/22/2012. Abdominal pelvic CT 07/28/2012.  Findings: There is stable volume loss in the right hemithorax  status post right upper lobe resection. The spiculated right lower  lobe nodule has not significantly changed, measuring 2.0 x 1.5 cm  on image 38. A 4 mm retrosternal nodule in the right middle lobe on  image 26  is unchanged. The scarring superior to the aortic arch in  the left upper lobe has improved. There is no evidence of focal  mass in this area. There is new left lower lobe atelectasis  adjacent to an enlarging left pleural effusion. Emphysematous  changes are again noted.  The mediastinum has a stable appearance with aortic atherosclerosis  and central enlargement of the pulmonary arteries. There are no  enlarged mediastinal or hilar lymph nodes.  There is a moderate sized dependent left pleural effusion. No  significant pleural fluid is seen on the right. There is no  pericardial effusion.  Images through the upper abdomen demonstrate an enlarging  perinephric fluid collection on the left with increased mass effect  on the left kidney. This is incompletely  imaged, but measures up  to 8.1 x 6.1 cm transverse on image #6 of series 8. There is a new  complex splenic subcapsular fluid collection with intermediate and  low density components. The low density component measures 8.3 x  5.7 cm transverse on image 47. The higher density component  beneath the left hemidiaphragm extends anteriorly and is suspicious  for a subcapsular hematoma or infection. There is an abnormal  density inferior to the left hepatic lobe and adjacent to the  stomach which does not appear to reflect the previously  demonstrated redundant transverse colon. Based on the reformatted  images, this appears contiguous with the splenic process and is  suspicious for extracapsular extension. There is persistent  colonic wall thickening at the hepatic flexure.  IMPRESSION:  1. No significant change in spiculated right lower lobe mass  remaining very concerning for bronchogenic carcinoma based on  morphology.  2. No other evidence of intrathoracic malignancy status post right  upper lobe resection.  3. Moderate sized left pleural effusion with associated left lower  lobe atelectasis. This pleural fluid is likely related to the  progressive subphrenic process.  4. Enlarging left perinephric fluid collection with new left  subphrenic fluid collections, likely representing splenic  subcapsular hematoma or abscess. There is concern of extracapsular  extension of this process anteriorly between the stomach and left  hepatic lobe. Percutaneous aspiration/drainage should be  considered.  These results were called by telephone on 10/30/2012 at 1005 hours  to Dr. Lendell Caprice, who verbally acknowledged these results.  Original Report Authenticated By: Carey Bullocks, M.D.    ASSESSMENT:  1. Spiculated right lower lobe mass, concerning for bronchogenic malignancy 2. Left pleural effusion 3. Intra-abdominal process consistent with very complex, multiloculated, predominately, cystic  mass with areas of likely hemorrhage/hematoma and/or enhancing soft tissue. 4. History of Pancoast tumor treated at [facility] with resection and concomitant chemoradiation by Dr. Wandra Mannan and Dr. Daisey Must. 5. Oxygen dependent COPD 6. History of aneurysm x2 with surgical intervention at [facility] 7. Tobacco abuse, smoking one half to one pack per day of tobacco x50 years 8. Hypertension 9. Chronic anemia 10. Hypothyroidism 11. Weight loss of 20 pounds in the past 3 months with decreased appetite.   PLAN:  1. We'll discuss the patient's case with Dr. Glenford Peers tomorrow morning. 2. Awaiting pathology from biopsy performed today. 3. We'll continue to follow as an inpatient.   All questions were answered. The patient knows to call the clinic with any problems, questions or concerns. We can certainly see the patient much sooner if necessary.  Patient and plan will be discussed with Dr. Mariel Sleet in the near future.   Hannelore Bova

## 2012-11-03 NOTE — Procedures (Signed)
Technically successful CT guided aspiration of left sided peri-renal fluid collection. No immediate post procedural complications.

## 2012-11-04 ENCOUNTER — Inpatient Hospital Stay (HOSPITAL_COMMUNITY): Payer: Medicare Other

## 2012-11-04 DIAGNOSIS — K651 Peritoneal abscess: Secondary | ICD-10-CM

## 2012-11-04 LAB — CBC
Hemoglobin: 9.2 g/dL — ABNORMAL LOW (ref 12.0–15.0)
MCH: 26.5 pg (ref 26.0–34.0)
MCHC: 33.5 g/dL (ref 30.0–36.0)
MCV: 79.3 fL (ref 78.0–100.0)

## 2012-11-04 LAB — COMPREHENSIVE METABOLIC PANEL
Alkaline Phosphatase: 107 U/L (ref 39–117)
BUN: 9 mg/dL (ref 6–23)
Creatinine, Ser: 0.5 mg/dL (ref 0.50–1.10)
GFR calc Af Amer: >90 mL/min (ref >90)
Glucose, Bld: 124 mg/dL — ABNORMAL HIGH (ref 70–99)
Potassium: 4.2 mEq/L (ref 3.5–5.1)
Total Bilirubin: 0.2 mg/dL — ABNORMAL LOW (ref 0.3–1.2)
Total Protein: 6 g/dL (ref 6.0–8.3)

## 2012-11-04 LAB — LACTATE DEHYDROGENASE, PLEURAL OR PERITONEAL FLUID: LD, Fluid: 2954 U/L — ABNORMAL HIGH (ref 3–23)

## 2012-11-04 LAB — AMYLASE, BODY FLUID: Amylase, Fluid: >10000 U/L

## 2012-11-04 LAB — LIPASE, FLUID: Lipase-Fluid: >2000 U/L

## 2012-11-04 LAB — SEDIMENTATION RATE: Sed Rate: 85 mm/hr — ABNORMAL HIGH (ref 0–22)

## 2012-11-04 MED ORDER — PIPERACILLIN-TAZOBACTAM 3.375 G IVPB
INTRAVENOUS | Status: AC
  Filled 2012-11-04: qty 100

## 2012-11-04 MED ORDER — PIPERACILLIN-TAZOBACTAM 3.375 G IVPB
3.3750 g | Freq: Three times a day (TID) | INTRAVENOUS | Status: DC
  Administered 2012-11-04 – 2012-11-08 (×12): 3.375 g via INTRAVENOUS
  Filled 2012-11-04 (×15): qty 50

## 2012-11-04 MED ORDER — PRO-STAT SUGAR FREE PO LIQD
30.0000 mL | Freq: Three times a day (TID) | ORAL | Status: DC
  Administered 2012-11-04 – 2012-11-08 (×10): 30 mL via ORAL
  Filled 2012-11-04 (×10): qty 30

## 2012-11-04 NOTE — Progress Notes (Signed)
ANTIBIOTIC CONSULT NOTE - INITIAL  Pharmacy Consult for Zosyn Indication: Intra abdominal abscess   Allergies  Allergen Reactions  . Sulfa Antibiotics Other (See Comments)    HANDS,FEET AND MOUTH BLISTERING    Patient Measurements: Height: 5\' 7"  (170.2 cm) Weight: 135 lb 12.9 oz (61.6 kg) IBW/kg (Calculated) : 61.6    Vital Signs: Temp: 98.4 F (36.9 C) (12/04 1422) Temp src: Oral (12/04 1422) BP: 134/84 mmHg (12/04 1422) Pulse Rate: 108  (12/04 1422) Intake/Output from previous day: 12/03 0701 - 12/04 0700 In: 240 [P.O.:240] Out: -  Intake/Output from this shift: Total I/O In: -  Out: 2 [Urine:1; Stool:1]  Labs:  [facility] 11/04/12 0459 11/03/12 0536 11/02/12 0524  WBC 14.2* 16.5* 17.1*  HGB 9.2* 8.7* 7.5*  PLT 415* 428* 492*  LABCREA -- -- --  CREATININE 0.50 0.56 --   Estimated Creatinine Clearance: 66.4 ml/min (by C-G formula based on Cr of 0.5). No results found for this basename: VANCOTROUGH:2,VANCOPEAK:2,VANCORANDOM:2,GENTTROUGH:2,GENTPEAK:2,GENTRANDOM:2,TOBRATROUGH:2,TOBRAPEAK:2,TOBRARND:2,AMIKACINPEAK:2,AMIKACINTROU:2,AMIKACIN:2, in the last 72 hours   Microbiology: Recent Results (from the past 720 hour(s))  CULTURE, BLOOD (ROUTINE X 2)     Status: Normal   Collection Time   10/29/12  9:03 AM      Component Value Range Status Comment   Specimen Description BLOOD LEFT HAND   Final    Special Requests BOTTLES DRAWN AEROBIC ONLY 6CC   Final    Culture NO GROWTH 5 DAYS   Final    Report Status 11/03/2012 FINAL   Final   CULTURE, BLOOD (ROUTINE X 2)     Status: Normal   Collection Time   10/29/12  9:04 AM      Component Value Range Status Comment   Specimen Description BLOOD RIGHT ANTECUBITAL   Final    Special Requests BOTTLES DRAWN AEROBIC AND ANAEROBIC 6CC   Final    Culture NO GROWTH 5 DAYS   Final    Report Status 11/03/2012 FINAL   Final   URINE CULTURE     Status: Normal   Collection Time   10/29/12 11:30 AM      Component Value Range  Status Comment   Specimen Description URINE, CATHETERIZED   Final    Special Requests NONE   Final    Culture  Setup Time 10/29/2012 12:10   Final    Colony Count NO GROWTH   Final    Culture NO GROWTH   Final    Report Status 10/30/2012 FINAL   Final   MRSA PCR SCREENING     Status: Normal   Collection Time   10/29/12  1:24 PM      Component Value Range Status Comment   MRSA by PCR NEGATIVE  NEGATIVE Final   CULTURE, ROUTINE-ABSCESS     Status: Normal (Preliminary result)   Collection Time   11/03/12 12:36 PM      Component Value Range Status Comment   Specimen Description ABSCESS PERI RENAL   Final    Special Requests Normal   Final    Gram Stain     Final    Value: MODERATE WBC PRESENT,BOTH PMN AND MONONUCLEAR     RARE GRAM NEGATIVE RODS   Culture NO GROWTH 1 DAY   Final    Report Status PENDING   Incomplete     Medical History: Past Medical History  Diagnosis Date  . Aneurysm     brain  . GERD (gastroesophageal reflux disease)   . Hypertension   . Anxiety   .  Oxygen dependent     requires oxygen for at least 16 hours per day,   . Hypothyroidism   . COPD (chronic obstructive pulmonary disease)     on home oxygen  . Anemia     iron deficiency anemia  . Gastric erosions     causing anemia  . Chronic respiratory failure   . Anemia   . Gastric ulcer   . Borderline diabetes   . Cancer   . Lung cancer     Medications:  Scheduled:    . albuterol  2.5 mg Nebulization QID  . antiseptic oral rinse  15 mL Mouth Rinse BID  . arformoterol  15 mcg Nebulization BID  . docusate sodium  100 mg Oral BID  . feeding supplement  1 Container Oral TID BM  . feeding supplement  30 mL Oral TID WC  . fluticasone  2 spray Each Nare BH-q7a  . ipratropium  0.5 mg Nebulization QID  . levothyroxine  88 mcg Oral QAC breakfast  . nicotine  21 mg Transdermal Daily  . pantoprazole  40 mg Oral Daily  . piperacillin-tazobactam (ZOSYN)  IV  3.375 g Intravenous Q8H  . predniSONE  10 mg  Oral Q breakfast  . sodium chloride  3 mL Intravenous Q12H  . theophylline  300 mg Oral BID  . venlafaxine XR  300 mg Oral Q breakfast   Assessment: CRCL > 30 ml/min  Goal of Therapy:  Eradicate infection  Plan:  Zosyn 3.375 GM IV every 8 hours Labs per protocol   Tracey Martinez, Tracey Martinez 11/04/2012,5:11 PM

## 2012-11-04 NOTE — Progress Notes (Signed)
Subjective: This lady feels much the same as yesterday. Unfortunately, the fluid collection that was aspirated yesterday was not sent for cytology!           Physical Exam: Blood pressure 140/82, pulse 81, temperature 97.9 F (36.6 C), temperature source Oral, resp. rate 20, height 5\' 7"  (1.702 m), weight 61.6 kg (135 lb 12.9 oz), SpO2 99.00%. She looks cachectic. There is no clubbing. Lung fields show bilateral coarse rhonchi bilaterally. Abdomen is distended, there are no specific masses I can feel. There is no hepatosplenomegaly clinically. She is alert and orientated. There is no supraclavicular lymphadenopathy.   Investigations:  Recent Results (from the past 240 hour(s))  CULTURE, BLOOD (ROUTINE X 2)     Status: Normal   Collection Time   10/29/12  9:03 AM      Component Value Range Status Comment   Specimen Description BLOOD LEFT HAND   Final    Special Requests BOTTLES DRAWN AEROBIC ONLY 6CC   Final    Culture NO GROWTH 5 DAYS   Final    Report Status 11/03/2012 FINAL   Final   CULTURE, BLOOD (ROUTINE X 2)     Status: Normal   Collection Time   10/29/12  9:04 AM      Component Value Range Status Comment   Specimen Description BLOOD RIGHT ANTECUBITAL   Final    Special Requests BOTTLES DRAWN AEROBIC AND ANAEROBIC 6CC   Final    Culture NO GROWTH 5 DAYS   Final    Report Status 11/03/2012 FINAL   Final   URINE CULTURE     Status: Normal   Collection Time   10/29/12 11:30 AM      Component Value Range Status Comment   Specimen Description URINE, CATHETERIZED   Final    Special Requests NONE   Final    Culture  Setup Time 10/29/2012 12:10   Final    Colony Count NO GROWTH   Final    Culture NO GROWTH   Final    Report Status 10/30/2012 FINAL   Final   MRSA PCR SCREENING     Status: Normal   Collection Time   10/29/12  1:24 PM      Component Value Range Status Comment   MRSA by PCR NEGATIVE  NEGATIVE Final   CULTURE, ROUTINE-ABSCESS     Status: Normal  (Preliminary result)   Collection Time   11/03/12 12:36 PM      Component Value Range Status Comment   Specimen Description ABSCESS PERI RENAL   Final    Special Requests Normal   Final    Gram Stain     Final    Value: MODERATE WBC PRESENT,BOTH PMN AND MONONUCLEAR     RARE GRAM NEGATIVE RODS   Culture NO GROWTH 1 DAY   Final    Report Status PENDING   Incomplete      Basic Metabolic Panel:  Basename 11/04/12 0459 11/03/12 0536  NA 132* 131*  K 4.2 4.0  CL 93* 92*  CO2 32 31  GLUCOSE 124* 97  BUN 9 9  CREATININE 0.50 0.56  CALCIUM 9.1 9.0  MG -- --  PHOS -- --       CBC:  Basename 11/04/12 0459 11/03/12 0536  WBC 14.2* 16.5*  NEUTROABS -- --  HGB 9.2* 8.7*  HCT 27.5* 26.1*  MCV 79.3 78.1  PLT 415* 428*    Ct Aspiration  11/03/2012  *RADIOLOGY REPORT*  Indication: Intra-abdominal fluid collection, concern for pancreatic pseudocyst  CT GUIDED LEFT PERINEPHRIC FLUID ASPIRATION  Comparison: CT abdomen pelvis - 10/31/2012; 07/28/2012; 07/10/2012  Medications: Fentanyl 50 mcg IV; Versed 1 mg IV  Total Moderate Sedation time: 9 minutes  Contrast: None  Complications: None immediate  Technique / Findings:  Informed written consent was obtained from the patient after a discussion of the risks, benefits and alternatives to treatment. The patient was placed supine, slightly RPO on the CT gantry and a pre procedural CT was performed re-demonstrating the known abscess/fluid collection adjacent to the anterior-inferior aspect of the left kidney.  The procedure was planned.   A timeout was performed prior to the initiation of the procedure.  The left lateral abdomen was prepped and draped in the usual sterile fashion.   The overlying soft tissues were anesthetized with 1% lidocaine with epinephrine.  Appropriate trajectory was planned with the use of a 22 gauge spinal needle.  A 5-French Yueh sheath needle was advanced into the abscess/fluid collection and approximately 120 ml of serous  fluid was aspirated.  A dressing was placed.  The patient tolerated the procedure well without immediate post procedural complication.  Impression:  Successful CT guided aspiration of approximately 120 ml of serous fluid from the left-sided perinephric fluid collection.  A drain was not placed as there is concern this collection may represent a pancreatic pseudocyst.  Samples were sent to the laboratory as requested by the ordering clinical team.  Amylase, lipase and LDH levels were ordered on aspirated specimens.   Original Report Authenticated By: Tacey Ruiz, MD       Medications: I have reviewed the patient's current medications.  Impression: 1. COPD exacerbation, improving. 2. Community acquired pneumonia, clinically improved. 3. Anemia of chronic disease, hemoglobin improving. 4. Hyponatremia, improving. Unclear etiology, possibly related to dehydration. 5. Lung cancer seen on CT chest scan. 6. Ongoing tobacco abuse. 7. Hypothyroidism. 8. Large, complex left subdiaphragmatic fluid collection, unclear etiology, status post CT-guided aspiration today. The overall picture is concerning for malignancy. 9. Severe malnutrition.     Plan: 1. Order ultrasound-guided thoracentesis of the left pleural effusion. 2. Await oncology input.     LOS: 6 days   Wilson Singer Pager [phone]  11/04/2012, 10:33 AM

## 2012-11-04 NOTE — Progress Notes (Signed)
Subjective: The patient is seen laying comfortably in bed sipping on her AM coffee.  She has consumed about 50% of her bowel/cup of Grits.    She reports that she had a good night.  She denies any complaints at this time.  I discussed the case with the Hospitalist, Dr. Karilyn Cota, because I do not see where the biopsy was sent for cytology.  So he will call the lab to see if this fluid can be sent for pathology for diagnosis purposes.   Patient seen this afternoon with Dr. Mariel Sleet: CT scans reviewed with Dr. Mariel Sleet and Dr. Tyron Russell.  After reviewing the CT scans in detail, it appears that the patient has blood collections in her abdomen.  In August 2013 she had the development of the perinephric collection, but the spleen collection was absent.  Therefore, the spleen collection is new.  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 She reports that her brother has a history of aneurysms and she has a strong family history of aneurysms with cousins having them.    She reports that [facility] has been following her spiculated lung nodule and that has been stable (per patient) x many years.  Her husband is at the bedside and helps with discussion this afternoon.   Objective: Vital signs in last 24 hours: Temp:  [97.9 F (36.6 C)-98.7 F (37.1 C)] 97.9 F (36.6 C) (12/04 0557) Pulse Rate:  [81-102] 81  (12/04 0557) Resp:  [14-20] 20  (12/04 0557) BP: (123-145)/(63-85) 140/82 mmHg (12/04 0557) SpO2:  [97 %-100 %] 99 % (12/04 0722)  Intake/Output from previous day: 12/03 0800 - 12/04 0759 In: 240 [P.O.:240] Out: -  Intake/Output this shift:    General appearance: alert, cooperative, appears older than stated age and no distress Resp: diminished breath sounds bilaterally Cardio: regular rate and rhythm, S1, S2 normal, no murmur, click, rub or gallop GI: abnormal findings:  LUQ fullness, distension throughout, + BS Extremities:  extremities normal, atraumatic, no cyanosis or edema Exacerbated right clavicular fossa with 2-3 ribs missing secondary to surgical intervention for Pancoast tumor  Lab Results:   [facility] 11/04/12 0459 11/03/12 0536  WBC 14.2* 16.5*  HGB 9.2* 8.7*  HCT 27.5* 26.1*  PLT 415* 428*   BMET  Basename 11/04/12 0459 11/03/12 0536  NA 132* 131*  K 4.2 4.0  CL 93* 92*  CO2 32 31  GLUCOSE 124* 97  BUN 9 9  CREATININE 0.50 0.56  CALCIUM 9.1 9.0    Studies/Results: Ct Aspiration  11/03/2012  *RADIOLOGY REPORT*  Indication: Intra-abdominal fluid collection, concern for pancreatic pseudocyst  CT GUIDED LEFT PERINEPHRIC FLUID ASPIRATION  Comparison: CT abdomen pelvis - 10/31/2012; 07/28/2012; 07/10/2012  Medications: Fentanyl 50 mcg IV; Versed 1 mg IV  Total Moderate Sedation time: 9 minutes  Contrast: None  Complications: None immediate  Technique / Findings:  Informed written consent was obtained from the patient after a discussion of the risks, benefits and alternatives to treatment. The patient was placed supine, slightly RPO on the CT gantry and a pre procedural CT was performed re-demonstrating the known abscess/fluid collection adjacent to the anterior-inferior aspect of the left kidney.  The procedure was planned.   A timeout was performed prior to the initiation of the procedure.  The left lateral abdomen was prepped and draped in the usual sterile fashion.   The overlying soft tissues were anesthetized with 1% lidocaine with epinephrine.  Appropriate  trajectory was planned with the use of a 22 gauge spinal needle.  A 5-French Yueh sheath needle was advanced into the abscess/fluid collection and approximately 120 ml of serous fluid was aspirated.  A dressing was placed.  The patient tolerated the procedure well without immediate post procedural complication.  Impression:  Successful CT guided aspiration of approximately 120 ml of serous fluid from the left-sided perinephric fluid collection.  A  drain was not placed as there is concern this collection may represent a pancreatic pseudocyst.  Samples were sent to the laboratory as requested by the ordering clinical team.  Amylase, lipase and LDH levels were ordered on aspirated specimens.   Original Report Authenticated By: Tacey Ruiz, MD     Medications: I have reviewed the patient's current medications.  Assessment/Plan: 1. 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  When discharged, recommend referring patient back to [facility] for continued follow-up. 2. Left pleural effusion  3. Intra-abdominal process consistent hematoma/hemorrhage.  CT scans reviewed with Dr. Mariel Sleet and Dr. Tyron Russell. S/P CT guided aspiration of left peri-renal fluid collection on 11/03/2012.  Results reveal >10,000 Amylase, 2954 LDH, >2000 Lipase, and rare gram negative rods. Cytology is pending. ?Pancreatitis dissection, ?aneurysmal bleed, ?infection?  Does not appear malignant at this time.  4. History of Pancoast tumor treated at [facility] with resection and concomitant chemoradiation by Dr. Wandra Mannan and Dr. Daisey Must.  5. Oxygen dependent COPD  6. History of aneurysm x2 with surgical intervention at [facility]  7. Tobacco abuse, smoking one half to one pack per day of tobacco x50 years  8. Hypertension  9. Chronic anemia  10. Hypothyroidism  11. Weight loss of 20 pounds in the past 3 months with decreased appetite. 12. With her abnormal intra-abdominal findings, we will discuss this case further with Hospitalist group.  The   Patient seen and examined by Dr. Glenford Peers and he is in agreement with the aforementioned.     LOS: 6 days    KEFALAS,THOMAS 11/04/2012  We are concerned about dissecting pancreatitis affecting her kidney with possible bacterial contamination of this fluid. The splenic hemorrhage is more difficult to explain. No aneurysm is presently appreciated as a possible cause for spontaneous bleed.  The spiculated  lesion the husband assures [place] has been seen for years but her ongoing smoking makes it still worrisome for malignancy but she has significant respiratory compromise in my opinion making resection an issue. Have discussed her case with Dr Karilyn Cota

## 2012-11-04 NOTE — Progress Notes (Signed)
Nutrition Follow-up  Intervention:  -Ensure Pudding TID , each supplement provides 170 kcal and 4 grams of protein.  -Add ProStat 30 ml TID ( 300 kcal, 45 gr protein) -Obtain current wt -Recommend consider trial of appetite stimulant if MD agrees  Assessment:   Pt's appetite slow to improve. She is receiving oral supplements which she tells me that she is taking. However, her meal intake is variable 0-50% most meals and continues to be inadequte. Will add protein supplement  to help meet her goal.   Diet Order: Regular  Meds: Scheduled Meds:   . albuterol  2.5 mg Nebulization QID  . antiseptic oral rinse  15 mL Mouth Rinse BID  . arformoterol  15 mcg Nebulization BID  . docusate sodium  100 mg Oral BID  . feeding supplement  1 Container Oral TID BM  . fluticasone  2 spray Each Nare BH-q7a  . ipratropium  0.5 mg Nebulization QID  . levothyroxine  88 mcg Oral QAC breakfast  . nicotine  21 mg Transdermal Daily  . pantoprazole  40 mg Oral Daily  . predniSONE  10 mg Oral Q breakfast  . sodium chloride  3 mL Intravenous Q12H  . theophylline  300 mg Oral BID  . venlafaxine XR  300 mg Oral Q breakfast  . [DISCONTINUED] predniSONE  20 mg Oral Q breakfast   Continuous Infusions:  PRN Meds:.acetaminophen, albuterol, busPIRone, morphine injection, ondansetron (ZOFRAN) IV, ondansetron, senna   CMP     Component Value Date/Time   NA 132* 11/04/2012 0459   K 4.2 11/04/2012 0459   CL 93* 11/04/2012 0459   CO2 32 11/04/2012 0459   GLUCOSE 124* 11/04/2012 0459   BUN 9 11/04/2012 0459   CREATININE 0.50 11/04/2012 0459   CREATININE 0.64 07/08/2012 1528   CALCIUM 9.1 11/04/2012 0459   PROT 6.0 11/04/2012 0459   ALBUMIN 2.1* 11/04/2012 0459   AST 44* 11/04/2012 0459   ALT 33 11/04/2012 0459   ALKPHOS 107 11/04/2012 0459   BILITOT 0.2* 11/04/2012 0459   GFRNONAA >90 11/04/2012 0459   GFRAA >90 11/04/2012 0459    CBG (last 3)   Basename 11/04/12 0601 11/03/12 0549 11/02/12 0726  GLUCAP 250* 94 95      Intake/Output Summary (Last 24 hours) at 11/04/12 1331 Last data filed at 11/04/12 1138  Gross per 24 hour  Intake    240 ml  Output      2 ml  Net    238 ml    Weight Status: no new wt    Estimated Nutritional Needs:  Kcal:1652-1880 kcal/day  Protein:77-89 gr/day  Fluid:1 ml/kcal/day    NUTRITION DIAGNOSIS:  -Inadequate oral intake (NI-2.1). Status: Ongoing   RELATED TO: poor appetite   AS EVIDENCE BY: diet hx and observed meal intake <50%, wt loss of 20# x 5 months   MONITORING/EVALUATION(Goals):  Monitor meals and supplement intake, wt trends  Goal: Pt to meet >/= 90% of their estimated nutrition needs; not met  [phone]

## 2012-11-05 DIAGNOSIS — R1012 Left upper quadrant pain: Secondary | ICD-10-CM

## 2012-11-05 DIAGNOSIS — D649 Anemia, unspecified: Secondary | ICD-10-CM

## 2012-11-05 LAB — CANCER ANTIGEN 19-9: CA 19-9: 20.2 U/mL — ABNORMAL LOW (ref <35.0)

## 2012-11-05 MED ORDER — MOMETASONE FURO-FORMOTEROL FUM 100-5 MCG/ACT IN AERO
2.0000 | INHALATION_SPRAY | Freq: Two times a day (BID) | RESPIRATORY_TRACT | Status: DC
  Administered 2012-11-05 – 2012-11-08 (×7): 2 via RESPIRATORY_TRACT
  Filled 2012-11-05: qty 8.8

## 2012-11-05 NOTE — Progress Notes (Signed)
Subjective: This lady feels much the same as yesterday, maybe slightly better. Once again, we went through a history of her abdominal distention and she describes that this started approximately one month ago. It was not associated with any pain in the abdomen. She did have some constipation which is now improved. At that time she never had any nausea or vomiting. The weight loss she describes actually started prior to the distention of her abdomen, approximately 2-3 months ago.           Physical Exam: Blood pressure 157/85, pulse 91, temperature 97.9 F (36.6 C), temperature source Oral, resp. rate 20, height 5\' 7"  (1.702 m), weight 61.6 kg (135 lb 12.9 oz), SpO2 100.00%. She looks cachectic. There is no clubbing. Lung fields are clear, with a few crackles, no wheezing or rhonchi. Abdomen is distended, there are no specific masses I can feel. There is no hepatosplenomegaly clinically. She is alert and orientated. There is no supraclavicular lymphadenopathy.   Investigations:  Recent Results (from the past 240 hour(s))  CULTURE, BLOOD (ROUTINE X 2)     Status: Normal   Collection Time   10/29/12  9:03 AM      Component Value Range Status Comment   Specimen Description BLOOD LEFT HAND   Final    Special Requests BOTTLES DRAWN AEROBIC ONLY 6CC   Final    Culture NO GROWTH 5 DAYS   Final    Report Status 11/03/2012 FINAL   Final   CULTURE, BLOOD (ROUTINE X 2)     Status: Normal   Collection Time   10/29/12  9:04 AM      Component Value Range Status Comment   Specimen Description BLOOD RIGHT ANTECUBITAL   Final    Special Requests BOTTLES DRAWN AEROBIC AND ANAEROBIC 6CC   Final    Culture NO GROWTH 5 DAYS   Final    Report Status 11/03/2012 FINAL   Final   URINE CULTURE     Status: Normal   Collection Time   10/29/12 11:30 AM      Component Value Range Status Comment   Specimen Description URINE, CATHETERIZED   Final    Special Requests NONE   Final    Culture  Setup  Time 10/29/2012 12:10   Final    Colony Count NO GROWTH   Final    Culture NO GROWTH   Final    Report Status 10/30/2012 FINAL   Final   MRSA PCR SCREENING     Status: Normal   Collection Time   10/29/12  1:24 PM      Component Value Range Status Comment   MRSA by PCR NEGATIVE  NEGATIVE Final   CULTURE, ROUTINE-ABSCESS     Status: Normal (Preliminary result)   Collection Time   11/03/12 12:36 PM      Component Value Range Status Comment   Specimen Description ABSCESS PERI RENAL   Final    Special Requests Normal   Final    Gram Stain     Final    Value: MODERATE WBC PRESENT,BOTH PMN AND MONONUCLEAR     RARE GRAM NEGATIVE RODS   Culture Culture reincubated for better growth   Final    Report Status PENDING   Incomplete      Basic Metabolic Panel:  Basename 11/04/12 0459 11/03/12 0536  NA 132* 131*  K 4.2 4.0  CL 93* 92*  CO2 32 31  GLUCOSE 124* 97  BUN 9 9  CREATININE 0.50 0.56  CALCIUM 9.1 9.0  MG -- --  PHOS -- --       CBC:  Basename 11/04/12 0459 11/03/12 0536  WBC 14.2* 16.5*  NEUTROABS -- --  HGB 9.2* 8.7*  HCT 27.5* 26.1*  MCV 79.3 78.1  PLT 415* 428*    Ct Aspiration  11/03/2012  *RADIOLOGY REPORT*  Indication: Intra-abdominal fluid collection, concern for pancreatic pseudocyst  CT GUIDED LEFT PERINEPHRIC FLUID ASPIRATION  Comparison: CT abdomen pelvis - 10/31/2012; 07/28/2012; 07/10/2012  Medications: Fentanyl 50 mcg IV; Versed 1 mg IV  Total Moderate Sedation time: 9 minutes  Contrast: None  Complications: None immediate  Technique / Findings:  Informed written consent was obtained from the patient after a discussion of the risks, benefits and alternatives to treatment. The patient was placed supine, slightly RPO on the CT gantry and a pre procedural CT was performed re-demonstrating the known abscess/fluid collection adjacent to the anterior-inferior aspect of the left kidney.  The procedure was planned.   A timeout was performed prior to the initiation of  the procedure.  The left lateral abdomen was prepped and draped in the usual sterile fashion.   The overlying soft tissues were anesthetized with 1% lidocaine with epinephrine.  Appropriate trajectory was planned with the use of a 22 gauge spinal needle.  A 5-French Yueh sheath needle was advanced into the abscess/fluid collection and approximately 120 ml of serous fluid was aspirated.  A dressing was placed.  The patient tolerated the procedure well without immediate post procedural complication.  Impression:  Successful CT guided aspiration of approximately 120 ml of serous fluid from the left-sided perinephric fluid collection.  A drain was not placed as there is concern this collection may represent a pancreatic pseudocyst.  Samples were sent to the laboratory as requested by the ordering clinical team.  Amylase, lipase and LDH levels were ordered on aspirated specimens.   Original Report Authenticated By: Tacey Ruiz, MD       Medications: I have reviewed the patient's current medications.  Impression: 1. COPD exacerbation, improving. 2. Community acquired pneumonia, clinically improved. 3. Anemia of chronic disease, hemoglobin improving. 4. Hyponatremia, improving. Unclear etiology, possibly related to dehydration. 5. Lung cancer seen on CT chest scan. 6. Ongoing tobacco abuse. 7. Hypothyroidism. 8. Large, complex left subdiaphragmatic fluid collection, status post aspiration. Fluid is significant for elevated amylase and lipase as well as LDH.  9. Severe malnutrition. 10. Gram-negative rods in collection.     Plan: 1. Discontinue prednisone. 2. Gastroenterology consultation for any recommendations regarding etiology of fluid collection. This is a puzzling patient!     LOS: 7 days   Wilson Singer Pager [phone]  11/05/2012, 10:23 AM

## 2012-11-05 NOTE — Consult Note (Signed)
Reason for Consult:? pancreatitis Referring Physician: *Hospitalist services  Tracey Martinez is an 66 y.o. female.  HPI: Tracey Martinez is a 66 yr old female admitted thru the ED 10/29/2012 with SOB.  She has a prior hx of lung cancer and COPD.  He continues to occasionally smokes. She is on continuous oxygen at home.  She tells me she has had abdominal distention for a couple of weeks.  She denies really having any abdominal pain.  She tells me she noticed the abdominal distention a couple of weeks before this admission when she was treated for a bladder infection.  She underwent a CT aspiration for intra-abdominal fluid collection, concerning for pancreatic pseudocyst 11/04/2012. Amylase, Lipase, and LDH levels were elevated.  She tells me she has lost aproximately 30 pounds over the past 6 months. She tells me she has a fever off and on. Her appetite is better since admission she tells me.  She usually has a BM oce a day.     07/2012 EGD/Colonoscopy:  07/02/2012 for followup EGD and colonoscopy. Esophageal ulcer had completely healed. Colonoscopy was completed to cecum. She had small tubular adenoma and serrated adenoma removed from her cecum. She was also found to have abnormality to mucosa felt to be suspicious for neoplasm in the region of proximal sigmoid or descending colon. Biopsy was negative for malignancy but suggested chronic inflammatory changes. She was therefore brought back for abdominopelvic CT which she had on 07/08/2012. It showed colonic wall thickening in the region of splenic flexure and descending colon with very colonic inflammatory changes as well as perinephric changes and small fluid collection in pancreatic tail. Infection was suspected from colonic or renal source. Patient was treated with antibiotics. She was seen by Dr. Lovell Sheehan it was felt she did not need surgical intervention.       10/29/2012 CT abdomen/pelvis :  IMPRESSION:  1. Interval development of a large, complex  left subdiaphragmatic  fluid collection the epicenter of which appears to be arising from  the subcapsular space of the spleen. The fluid collection  demonstrates clear fluid-fluid levels with high attenuation  layering inferiorly and extending anteriorly over the gastric  fundus and into the gastrohepatic ligament. The high attenuation  may represent hematoma and / or enhancing soft tissue. This  complex fluid collection abuts an enlarging multiloculated cystic  collection in the subcapsular space of the left kidney. The  pancreatic tail is sandwich between these two complex collections  and is difficult to separate from the collections. IMPRESSION:  1. Interval development of a large, complex left subdiaphragmatic  fluid collection the epicenter of which appears to be arising from  the subcapsular space of the spleen. The fluid collection  demonstrates clear fluid-fluid levels with high attenuation  layering inferiorly and extending anteriorly over the gastric  fundus and into the gastrohepatic ligament. The high attenuation  may represent hematoma and / or enhancing soft tissue. This  complex fluid collection abuts an enlarging multiloculated cystic  collection in the subcapsular space of the left kidney. The  pancreatic tail is sandwich between these two complex collections  and is difficult to separate from the collections.  The findings are most consistent with a very complex multiloculated  predominately cystic mass with areas of likely hemorrhage/hematoma  and/or enhancing soft tissue. Differential considerations include  a mucinous cystic neoplasm of the pancreas with superimposed  hemorrhage, a hemorrhagic pancreatic pseudocyst, and less likely  (given the patients improving clinical status) an abscess  collection with  a hemorrhagic component. A mass lesion such as a  neoplasm, or potentially a hemorrhagic pseudocyst is favored given  the degree of mass effect on the  surrounding structures.  CBC    Component Value Date/Time   WBC 14.2* 11/04/2012 0459   RBC 3.47* 11/04/2012 0459   HGB 9.2* 11/04/2012 0459   HCT 27.5* 11/04/2012 0459   PLT 415* 11/04/2012 0459   MCV 79.3 11/04/2012 0459   MCH 26.5 11/04/2012 0459   MCHC 33.5 11/04/2012 0459   RDW 18.3* 11/04/2012 0459   LYMPHSABS 0.8 11/01/2012 0644   MONOABS 1.1* 11/01/2012 0644   EOSABS 0.0 11/01/2012 0644   BASOSABS 0.0 11/01/2012 0644    Hepatic Function Panel     Component Value Date/Time   PROT 6.0 11/04/2012 0459   ALBUMIN 2.1* 11/04/2012 0459   AST 44* 11/04/2012 0459   ALT 33 11/04/2012 0459   ALKPHOS 107 11/04/2012 0459   BILITOT 0.2* 11/04/2012 0459   BILIDIR 0.2 10/29/2012 0900   IBILI 0.1* 10/29/2012 0900      History of Present Illness:    Past Medical History  Diagnosis Date  . Aneurysm     brain  . GERD (gastroesophageal reflux disease)   . Hypertension   . Anxiety   . Oxygen dependent     requires oxygen for at least 16 hours per day,   . Hypothyroidism   . COPD (chronic obstructive pulmonary disease)     on home oxygen  . Anemia     iron deficiency anemia  . Gastric erosions     causing anemia  . Chronic respiratory failure   . Anemia   . Gastric ulcer   . Borderline diabetes   . Cancer   . Lung cancer     Past Surgical History  Procedure Date  . Lung removal, partial   . Abdominal hysterectomy   . Esophagogastroduodenoscopy 05/11/2012    Procedure: ESOPHAGOGASTRODUODENOSCOPY (EGD);  Surgeon: Malissa Hippo, MD;  Location: [facility];  Service: Endoscopy;  Laterality: N/A;  . Cataract extraction, bilateral     Family History  Problem Relation Age of Onset  . Hypertension Mother   . Hypertension Father   . Diabetes Mother   . Cancer Brother   . Diabetes Brother   . Diabetes Brother     Social History:  reports that she has been smoking.  She does not have any smokeless tobacco history on file. She reports that she does not drink alcohol or use  illicit drugs.  Allergies:  Allergies  Allergen Reactions  . Sulfa Antibiotics Other (See Comments)    HANDS,FEET AND MOUTH BLISTERING    Medications: I have reviewed the patient's current medications.  Amylase No results found for this basename: amylase      Ct Aspiration  11/03/2012  *RADIOLOGY REPORT*  Indication: Intra-abdominal fluid collection, concern for pancreatic pseudocyst  CT GUIDED LEFT PERINEPHRIC FLUID ASPIRATION  Comparison: CT abdomen pelvis - 10/31/2012; 07/28/2012; 07/10/2012  Medications: Fentanyl 50 mcg IV; Versed 1 mg IV  Total Moderate Sedation time: 9 minutes  Contrast: None  Complications: None immediate  Technique / Findings:  Informed written consent was obtained from the patient after a discussion of the risks, benefits and alternatives to treatment. The patient was placed supine, slightly RPO on the CT gantry and a pre procedural CT was performed re-demonstrating the known abscess/fluid collection adjacent to the anterior-inferior aspect of the left kidney.  The procedure was planned.   A timeout  was performed prior to the initiation of the procedure.  The left lateral abdomen was prepped and draped in the usual sterile fashion.   The overlying soft tissues were anesthetized with 1% lidocaine with epinephrine.  Appropriate trajectory was planned with the use of a 22 gauge spinal needle.  A 5-French Yueh sheath needle was advanced into the abscess/fluid collection and approximately 120 ml of serous fluid was aspirated.  A dressing was placed.  The patient tolerated the procedure well without immediate post procedural complication.  Impression:  Successful CT guided aspiration of approximately 120 ml of serous fluid from the left-sided perinephric fluid collection.  A drain was not placed as there is concern this collection may represent a pancreatic pseudocyst.  Samples were sent to the laboratory as requested by the ordering clinical team.  Amylase, lipase and LDH  levels were ordered on aspirated specimens.   Original Report Authenticated By: Tacey Ruiz, MD     ROS Blood pressure 157/85, pulse 91, temperature 97.9 F (36.6 C), temperature source Oral, resp. rate 20, height 5\' 7"  (1.702 m), weight 135 lb 12.9 oz (61.6 kg), SpO2 100.00%. Physical ExamAlert and oriented. Skin warm and dry. Oral mucosa is moist.   . Sclera anicteric, conjunctivae is pink. Thyroid not enlarged. No cervical lymphadenopathy. Bilateral rhonchi. Heart regular rate and rhythm.  Abdomen is soft. Bowel sounds are positive. No hepatomegaly. No abdominal masses felt. No tenderness.  No edema to lower extremities. Patient is alert and oriented.   Assessment/Plan:IMPRESSION:   complex left subdiaphragmatic  fluid collection the epicenter of which appears to be arising from  the subcapsular space of the spleen.  This  complex fluid collection abuts an enlarging multiloculated cystic  collection in the subcapsular space of the left kidney.  The findings are most consistent with a very complex multiloculated  predominately cystic mass with areas of likely hemorrhage/hematoma  and/or enhancing soft tissue. Differential considerations include  a mucinous cystic neoplasm of the pancreas with superimposed  hemorrhage, . A mass lesion such as a  neoplasm, or potentially a hemorrhagic pseudocyst is favored given  the degree of mass effect on the surrounding structures.  I will discuss with Dr. Karilyn Cota. Agree with IV Zosyn TID  SETZER,TERRI W 11/05/2012, 9:08 AM     GI attending note; Patient interviewed and examined. Current and prior imaging studies reviewed. Patient is well known to me from previous evaluation. She was initially seen in May 2013 for upper GI bleed and found to have extensive ulceration to her esophagus. This injury was felt to be due to vomiting and heaving. She underwent EGD and colonoscopy in August 2013 for persistent anemia and heme-positive stools. Esophageal  ulcer healed. Colonoscopy revealed 2 small cecal adenomas colonic diverticulosis and stricture in the region and moist no descending colon. Biopsy from this area revealed crypt distortion and nonspecific inflammation. Please note patient was free of abdominal pain. She therefore had abdominopelvic CT with contrast on 07/08/2012 revealing inflammatory process in left upper quadrant with fluid collection around the left kidney tail of pancreas pericolonic inflammatory changes. While she had no acute symptoms she was hospitalized and empirically treated with broad-spectrum antibiotics. She remained afebrile and pain free during this period she had followup CT on 07/24/2012 and there was no improvement in fluid collection and inflammatory changes in left upper quadrant of her abdomen. I was concerned that she had malignant process she was referred to [facility]. She was evaluated. No further recommendations are made. Patient was  in usual state of health until few weeks ago when she started to experience lower abdominal pain followed by epigastric and left upper quadrant pain. She recalls she fell a few days prior to onset of the symptoms. She also noted progressive weakness and shortness of breath hence reason for visit to emergency room and subsequent hospitalization on 10/29/2012. Abdominopelvic CT revealed progressive/extensive changes in left upper quadrant. Large collection and splenic subcapsular space was noted consistent with blood and fluid and similar changes were noted around gastrohepatic ligament around the fundus extending to left hepatic lobe. She also had fluid collection around left kidney larger than on previous study. She had CT-guided drainage of left perinephric fluid collection. Fluid was serous. Analysis revealed very high content of amylase and lipase(amylase is greater than 10,000 lipase greater than 2000). Cultures positive for gram-negative rods. She is now on Zosyn. She has received 4 units of  PRBCs. She reports improvement in her breathing. She remains with left upper quadrant pain. Her appetite is fair. She denies hematuria melena or rectal bleeding. She has noted somewhat loose stools since he's been on antibiotic. Assessment; Even though patient has never experienced clinical episode of pancreatitis it is possible that inflammatory process in the left upper quadrant is secondary to indolent pancreatitis complicated by pseudocyst which is now infected. She does not appear to be acutely ill and certainly not behaving like a patient who is pancreatic abscess She does have cholelithiasis presumed to be asymptomatic. CA 19-9 is mildly elevated and what appeared to be nonspecific. It is also possible that the process originated outside the pancreas and extending into pancreas with secondary pancreatitis. I did review chest CT angiogram may 2013 which does not show any changes in left side of diaphragmatic region. I suspect subcapsular splenic bleed is most likely due to fall but could be spontaneous. I believe endoscopic ultrasound may help [place] sort her condition. Recommendations; Continue IV Zosyn. Will arrange for EUS to evaluate her pancreas at [facility] next week

## 2012-11-06 ENCOUNTER — Other Ambulatory Visit: Payer: Self-pay

## 2012-11-06 ENCOUNTER — Telehealth: Payer: Self-pay | Admitting: Gastroenterology

## 2012-11-06 DIAGNOSIS — K8689 Other specified diseases of pancreas: Secondary | ICD-10-CM

## 2012-11-06 LAB — COMPREHENSIVE METABOLIC PANEL
BUN: 11 mg/dL (ref 6–23)
CO2: 34 mEq/L — ABNORMAL HIGH (ref 19–32)
Chloride: 95 mEq/L — ABNORMAL LOW (ref 96–112)
Creatinine, Ser: 0.56 mg/dL (ref 0.50–1.10)
GFR calc non Af Amer: >90 mL/min (ref >90)
Total Bilirubin: 0.3 mg/dL (ref 0.3–1.2)

## 2012-11-06 LAB — GLUCOSE, CAPILLARY: Glucose-Capillary: 105 mg/dL — ABNORMAL HIGH (ref 70–99)

## 2012-11-06 LAB — CBC
HCT: 28.9 % — ABNORMAL LOW (ref 36.0–46.0)
MCV: 81.4 fL (ref 78.0–100.0)
RBC: 3.55 MIL/uL — ABNORMAL LOW (ref 3.87–5.11)
WBC: 15 10*3/uL — ABNORMAL HIGH (ref 4.0–10.5)

## 2012-11-06 LAB — CULTURE, ROUTINE-ABSCESS

## 2012-11-06 MED ORDER — IPRATROPIUM BROMIDE 0.02 % IN SOLN
0.5000 mg | Freq: Three times a day (TID) | RESPIRATORY_TRACT | Status: DC
  Administered 2012-11-07 – 2012-11-08 (×5): 0.5 mg via RESPIRATORY_TRACT
  Filled 2012-11-06 (×5): qty 2.5

## 2012-11-06 MED ORDER — ALBUTEROL SULFATE (5 MG/ML) 0.5% IN NEBU
2.5000 mg | INHALATION_SOLUTION | Freq: Three times a day (TID) | RESPIRATORY_TRACT | Status: DC
  Administered 2012-11-07 – 2012-11-08 (×5): 2.5 mg via RESPIRATORY_TRACT
  Filled 2012-11-06 (×5): qty 0.5

## 2012-11-06 NOTE — Telephone Encounter (Signed)
I spoke with Dr. Karilyn Cota and reviewed her films, labs. I think it is feasible that she has a tail of pancreas lesion causing pancreatic duct obstruction and subsequent fluid accumulation. There is no obvious mass on the CT however. Clearly the fluid that was sampled is pancreatic fluid since its amylase and lipase are still significantly elevated. She has significant pulmonary disease and because of that she will require anesthesia support for endoscopic ultrasound. I asked at  [facility] endoscopy for any times next week with anesthesia support and there are none. It looks like the best we can do is early Monday morning on December 16. We will be working towards that and will book her. We will currently book her to be done as an inpatient however she is well enough to go home that can easily be changed as an outpatient.  Dr Denny Peon will be getting in touch sometime next week about her status.  Patty, Please put her on for upper endoscopy with endoscopic ultrasound radial plus minus linear for Monday the 16th early in the morning. This will be done for possible pancreatic mass. She will need anesthesia support, likely MAC sedation. Will need to clear my [facility] schedule as we spoke about that morning.

## 2012-11-06 NOTE — Progress Notes (Signed)
Patient feels about the same. She continues to complain of early satiety, epigastric fullness and left upper quadrant pain. Cultures from perirenal fluid alsohas  few yeast consistent with candida species. Gram-negative rods have not been identified yet. Condition discussed with Dr. Wendall Papa and EUS planned tentatively for 11/16/2012. Patient will also need anti-fungal therapy. Will discuss with Dr.Gosrani.

## 2012-11-06 NOTE — Progress Notes (Addendum)
Subjective: Tracey Martinez feels better today, she is tolerating food, she is anxious to find out what is wrong with her. She was seen by gastroenterology yesterday, who has recommended endoscopic ultrasound. Dr Karilyn Cota, will get in touch with Dr. Christella Hartigan who does Tracey procedure.           Physical Exam: Blood pressure 158/83, pulse 84, temperature 98.5 F (36.9 C), temperature source Oral, resp. rate 20, height 5\' 7"  (1.702 m), weight 61.6 kg (135 lb 12.9 oz), SpO2 100.00%. She looks cachectic. There is no clubbing. Lung fields are clear, with a few crackles, no wheezing or rhonchi. Abdomen is distended, there are no specific masses I can feel. There is no hepatosplenomegaly clinically. She is alert and orientated. There is no supraclavicular lymphadenopathy.   Investigations:  Recent Results (from the past 240 hour(s))  CULTURE, BLOOD (ROUTINE X 2)     Status: Normal   Collection Time   10/29/12  9:03 AM      Component Value Range Status Comment   Specimen Description BLOOD LEFT HAND   Final    Special Requests BOTTLES DRAWN AEROBIC ONLY 6CC   Final    Culture NO GROWTH 5 DAYS   Final    Report Status 11/03/2012 FINAL   Final   CULTURE, BLOOD (ROUTINE X 2)     Status: Normal   Collection Time   10/29/12  9:04 AM      Component Value Range Status Comment   Specimen Description BLOOD RIGHT ANTECUBITAL   Final    Special Requests BOTTLES DRAWN AEROBIC AND ANAEROBIC 6CC   Final    Culture NO GROWTH 5 DAYS   Final    Report Status 11/03/2012 FINAL   Final   URINE CULTURE     Status: Normal   Collection Time   10/29/12 11:30 AM      Component Value Range Status Comment   Specimen Description URINE, CATHETERIZED   Final    Special Requests NONE   Final    Culture  Setup Time 10/29/2012 12:10   Final    Colony Count NO GROWTH   Final    Culture NO GROWTH   Final    Report Status 10/30/2012 FINAL   Final   MRSA PCR SCREENING     Status: Normal   Collection Time   10/29/12   1:24 PM      Component Value Range Status Comment   MRSA by PCR NEGATIVE  NEGATIVE Final   CULTURE, ROUTINE-ABSCESS     Status: Normal (Preliminary result)   Collection Time   11/03/12 12:36 PM      Component Value Range Status Comment   Specimen Description ABSCESS PERI RENAL   Final    Special Requests Normal   Final    Gram Stain     Final    Value: MODERATE WBC PRESENT,BOTH PMN AND MONONUCLEAR     RARE GRAM NEGATIVE RODS   Culture Culture reincubated for better growth   Final    Report Status PENDING   Incomplete      Basic Metabolic Panel:  Basename 11/06/12 0628 11/04/12 0459  NA 136 132*  K 3.7 4.2  CL 95* 93*  CO2 34* 32  GLUCOSE 85 124*  BUN 11 9  CREATININE 0.56 0.50  CALCIUM 9.1 9.1  MG -- --  PHOS -- --       CBC:  Basename 11/06/12 0628 11/04/12 0459  WBC 15.0* 14.2*  NEUTROABS -- --  HGB 9.2* 9.2*  HCT 28.9* 27.5*  MCV 81.4 79.3  PLT 359 415*    No results found.    Medications: I have reviewed the patient's current medications.  Impression: 1. COPD exacerbation, improving. 2. Community acquired pneumonia, clinically improved. 3. Anemia of chronic disease, hemoglobin improving. 4. Hyponatremia, resolved. 5. Lung cancer seen on CT chest scan. 6. Ongoing tobacco abuse. 7. Hypothyroidism. 8. Large, complex left subdiaphragmatic fluid collection, status post aspiration. Fluid is significant for elevated amylase and lipase as well as LDH.  9. Severe malnutrition. 10. Gram-negative rods in collection.     Plan: 1. Continue current therapy. 2. Await endoscopic ultrasound by Dr. Christella Hartigan.     LOS: 8 days   Wilson Singer Pager [phone]  11/06/2012, 10:11 AM

## 2012-11-07 ENCOUNTER — Inpatient Hospital Stay (HOSPITAL_COMMUNITY): Payer: Medicare Other

## 2012-11-07 LAB — GLUCOSE, CAPILLARY: Glucose-Capillary: 140 mg/dL — ABNORMAL HIGH (ref 70–99)

## 2012-11-07 MED ORDER — FLUCONAZOLE 100MG IVPB
100.0000 mg | INTRAVENOUS | Status: DC
  Administered 2012-11-07 – 2012-11-08 (×2): 100 mg via INTRAVENOUS
  Filled 2012-11-07 (×3): qty 50

## 2012-11-07 NOTE — Progress Notes (Signed)
Subjective; Patient feels better. She states she was able to eat all of her breakfast. She continues to complain of fullness and mild epigastric pain. She also complains of loose stools. Objective; BP 138/86  Pulse 98  Temp 98.2 F (36.8 C) (Oral)  Resp 20  Ht 5\' 7"  (1.702 m)  Wt 135 lb 12.9 oz (61.6 kg)  BMI 21.27 kg/m2  SpO2 97% Abdomen is full soft with mild tenderness in epigastric region. Lab data; No further info on gram-negative rods in fluid that was aspirated from around left kidney. Also showed few yeast. Assessment; Progressive perisplenic and perirenal fluid collection which was initially discovered in August 2013. Fluid analysis consistent with pseudocyst which is infected and no pancreatic lesions obvious. There is also no history of pancreatitis. Patient remains on Zosyn and fluconazole added. Pancreatic  EUS is planned for 11/16/2012 but that is 9 days from today but I think she needs this study earlier if possible. Loose stools. Recommendations; Discontinue Colace. Abdominal CT with contrast to assess noted collection in left upper quadrant of abdomen. C. difficile by PCR.

## 2012-11-07 NOTE — Progress Notes (Signed)
Subjective: This lady feels better today, she is tolerating food, she is anxious to find out what is wrong with her. She was seen by gastroenterology , who has recommended endoscopic ultrasound. Dr Karilyn Cota, has spoken to Dr. Christella Hartigan, unfortunately Dr. Christella Hartigan, do the procedure until December 16.          Physical Exam: Blood pressure 138/86, pulse 98, temperature 98.2 F (36.8 C), temperature source Oral, resp. rate 20, height 5\' 7"  (1.702 m), weight 61.6 kg (135 lb 12.9 oz), SpO2 97.00%. She looks cachectic. There is no clubbing. Lung fields are clear. Abdomen is distended, there are no specific masses I can feel. There is no hepatosplenomegaly clinically. She is alert and orientated. There is no supraclavicular lymphadenopathy.   Investigations:  Recent Results (from the past 240 hour(s))  CULTURE, BLOOD (ROUTINE X 2)     Status: Normal   Collection Time   10/29/12  9:03 AM      Component Value Range Status Comment   Specimen Description BLOOD LEFT HAND   Final    Special Requests BOTTLES DRAWN AEROBIC ONLY 6CC   Final    Culture NO GROWTH 5 DAYS   Final    Report Status 11/03/2012 FINAL   Final   CULTURE, BLOOD (ROUTINE X 2)     Status: Normal   Collection Time   10/29/12  9:04 AM      Component Value Range Status Comment   Specimen Description BLOOD RIGHT ANTECUBITAL   Final    Special Requests BOTTLES DRAWN AEROBIC AND ANAEROBIC 6CC   Final    Culture NO GROWTH 5 DAYS   Final    Report Status 11/03/2012 FINAL   Final   URINE CULTURE     Status: Normal   Collection Time   10/29/12 11:30 AM      Component Value Range Status Comment   Specimen Description URINE, CATHETERIZED   Final    Special Requests NONE   Final    Culture  Setup Time 10/29/2012 12:10   Final    Colony Count NO GROWTH   Final    Culture NO GROWTH   Final    Report Status 10/30/2012 FINAL   Final   MRSA PCR SCREENING     Status: Normal   Collection Time   10/29/12  1:24 PM      Component  Value Range Status Comment   MRSA by PCR NEGATIVE  NEGATIVE Final   CULTURE, ROUTINE-ABSCESS     Status: Normal   Collection Time   11/03/12 12:36 PM      Component Value Range Status Comment   Specimen Description ABSCESS PERI RENAL   Final    Special Requests NONE   Final    Gram Stain     Final    Value: MODERATE WBC PRESENT,BOTH PMN AND MONONUCLEAR     RARE GRAM NEGATIVE RODS   Culture FEW YEAST CONSISTENT WITH CANDIDA SPECIES   Final    Report Status 11/06/2012 FINAL   Final      Basic Metabolic Panel:  Basename 11/06/12 0628  NA 136  K 3.7  CL 95*  CO2 34*  GLUCOSE 85  BUN 11  CREATININE 0.56  CALCIUM 9.1  MG --  PHOS --       CBC:  Basename 11/06/12 0628  WBC 15.0*  NEUTROABS --  HGB 9.2*  HCT 28.9*  MCV 81.4  PLT 359    No results found.  Medications: I have reviewed the patient's current medications.  Impression: 1. COPD exacerbation, resolved. 2. Community acquired pneumonia, clinically improved. 3. Anemia of chronic disease, hemoglobin stable. 4. Hyponatremia, resolved. 5. Lung cancer seen on CT chest scan. 6. Ongoing tobacco abuse. 7. Hypothyroidism. 8. Large, complex left subdiaphragmatic fluid collection, status post aspiration. Fluid is significant for elevated amylase and lipase as well as LDH. Fluid also showing presence of gram-negative rods and fungal growth. 9. Severe malnutrition.      Plan: 1. Continue current therapy. Add Diflucan. 2. Await further recommendations from gastroenterology.     LOS: 9 days   Wilson Singer Pager [phone]  11/07/2012, 11:53 AM

## 2012-11-08 ENCOUNTER — Encounter (HOSPITAL_COMMUNITY): Payer: Self-pay | Admitting: Internal Medicine

## 2012-11-08 ENCOUNTER — Inpatient Hospital Stay (HOSPITAL_COMMUNITY): Payer: Medicare Other

## 2012-11-08 DIAGNOSIS — Z8679 Personal history of other diseases of the circulatory system: Secondary | ICD-10-CM

## 2012-11-08 DIAGNOSIS — Z982 Presence of cerebrospinal fluid drainage device: Secondary | ICD-10-CM

## 2012-11-08 DIAGNOSIS — E43 Unspecified severe protein-calorie malnutrition: Secondary | ICD-10-CM

## 2012-11-08 DIAGNOSIS — D62 Acute posthemorrhagic anemia: Secondary | ICD-10-CM

## 2012-11-08 DIAGNOSIS — R634 Abnormal weight loss: Secondary | ICD-10-CM | POA: Diagnosis present

## 2012-11-08 HISTORY — DX: Personal history of other diseases of the circulatory system: Z86.79

## 2012-11-08 HISTORY — DX: Presence of cerebrospinal fluid drainage device: Z98.2

## 2012-11-08 LAB — COMPREHENSIVE METABOLIC PANEL
AST: 39 U/L — ABNORMAL HIGH (ref 0–37)
Albumin: 2.1 g/dL — ABNORMAL LOW (ref 3.5–5.2)
BUN: 9 mg/dL (ref 6–23)
Calcium: 9.1 mg/dL (ref 8.4–10.5)
Creatinine, Ser: 0.41 mg/dL — ABNORMAL LOW (ref 0.50–1.10)
Total Protein: 6.2 g/dL (ref 6.0–8.3)

## 2012-11-08 LAB — CBC
HCT: 26.4 % — ABNORMAL LOW (ref 36.0–46.0)
MCHC: 32.2 g/dL (ref 30.0–36.0)
MCV: 80.7 fL (ref 78.0–100.0)
Platelets: 398 10*3/uL (ref 150–400)
RDW: 19.4 % — ABNORMAL HIGH (ref 11.5–15.5)

## 2012-11-08 LAB — GLUCOSE, CAPILLARY: Glucose-Capillary: 119 mg/dL — ABNORMAL HIGH (ref 70–99)

## 2012-11-08 MED ORDER — IOHEXOL 300 MG/ML  SOLN
100.0000 mL | Freq: Once | INTRAMUSCULAR | Status: AC | PRN
  Administered 2012-11-08: 100 mL via INTRAVENOUS

## 2012-11-08 NOTE — Progress Notes (Signed)
Report called to Marcelino Duster RN at [facility], [facility] coming to transfer patient.

## 2012-11-08 NOTE — Discharge Summary (Signed)
Physician Discharge Summary  Patient ID: Tracey Martinez MRN: [id number] DOB/AGE: 1946-10-09 66 y.o.  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 albuterol  2.5 mg Nebulization TID  . antiseptic oral rinse  15 mL Mouth Rinse BID  . feeding supplement  1 Container Oral TID BM  . feeding supplement  30 mL Oral TID WC  . fluconazole (DIFLUCAN) IV  100 mg Intravenous Q24H  . fluticasone  2 spray Each Nare [id number]  . ipratropium  0.5 mg Nebulization TID  . levothyroxine  88 mcg Oral QAC breakfast  . mometasone-formoterol  2 puff Inhalation BID  . nicotine  21 mg Transdermal Daily  . pantoprazole  40 mg Oral Daily  . piperacillin-tazobactam (ZOSYN)  IV  3.375 g Intravenous Q8H  . sodium chloride  3 mL Intravenous Q12H  . venlafaxine XR  300 mg Oral Q breakfast  . [DISCONTINUED] docusate sodium  100 mg Oral BID   Continuous Infusions:  PRN Meds:.acetaminophen, albuterol, busPIRone, [COMPLETED] iohexol, morphine injection, ondansetron (ZOFRAN) IV, ondansetron, senna  Disposition: [facility]  Discharged Condition: stable  Consults: Treatment Team:  Randall An, MD Rehman, Najeeb  Labs:    BLD GAS (ART, VEN, CAP, CORD, SCALP)   Sample type   ARTERIAL             Delivery systems   BILEVEL POSITIVE AIRWAY PRESSURE             FIO2   60.00             Inspiratory PAP   18             Expiratory PAP   8             pH, Arterial   7.363             pCO2 arterial   45.2              pO2, Arterial   153.0             Bicarbonate   25.1             TCO2   23.8             Acid-Base Excess   0.4             O2 Saturation   99.6             Collection site   RIGHT BRACHIAL             Allens test (pass/fail)   PASS             CHEM PROFILE   Sodium   125 127 127  131 132  136  134    Potassium   4.5 4.8 4.6  4.0 4.2  3.7  3.6    Chloride   87 89 90  92 93  95  94    CO2   26 28 30  31  32  34  33    Mean Plasma Glucose   143  BUN   24 22 11  9 9  11  9     Creatinine, Ser   0.96 0.73 0.58  0.56 0.50  0.56  0.41    Calcium   8.8 8.8 8.9  9.0 9.1  9.1  9.1    GFR calc non Af Amer   60 86 >90  >90 >90  >90  >90    GFR calc Af Amer   69 >90 >90  >90 >90  >90  >90    Glucose, Bld   129 132 96  97 124  85  129    Alkaline Phosphatase   117     107  110  147    Albumin   2.3     2.1  2.2  2.1    AST   12     44  61  39    ALT   9     33  51  44    Total Protein   6.6     6.0  6.1  6.2    Bilirubin, Direct   0.2             Indirect Bilirubin   0.1             Total Bilirubin   0.3     0.2  0.3  0.5    CARDIAC PROFILE   Troponin I   <0.30             IRON /ANEMIA PROFILE   Ferritin   280             OTHER CHEM   Lactic Acid, Venous   3.3             Osmolality    272            Procalcitonin   1.67             CBC   WBC   12.6 21.4 20.2 17.1 16.5 14.2  15.0  18.3    RBC   3.78 3.11 2.76 2.96 3.34 3.47  3.55  3.27    Hemoglobin   9.6 7.6 7.0 7.5 8.7 9.2  9.2  8.5    HCT   29.4 22.4 21.0 22.7 26.1 27.5  28.9  26.4    MCV   77.8 78.1 76.1 76.7 78.1 79.3  81.4  80.7    MCH   25.4 25.7 25.4 25.3 26.0 26.5  25.9  26.0    MCHC   32.7 32.9 33.3 33.0 33.3 33.5  31.8  32.2    RDW   17.1 17.3 17.5 17.6 17.7 18.3  19.3  19.4    Platelets   476 500 491 492 428 415  359  398    DIFFERENTIAL   Neutrophils Relative   84  91           Lymphocytes Relative   10  4           Monocytes Relative   7  5           Eosinophils Relative   0  0           Basophils Relative    0  0           Neutro Abs   10.5  18.3           Lymphs Abs   1.2  0.8  Monocytes Absolute   0.8  1.1           Eosinophils Absolute   0.0  0.0           Basophils Absolute   0.0  0.0           OTHER HEMATOLOGY   Sed Rate        85        PROTIME W/ INR   Prothrombin Time       18.1         INR       1.55         PTT   aPTT       37         OTHER DRUGS   Theophylline Lvl   3.2             DIABETES   Hemoglobin A1C   6.6             Glucose, Bld   129 132 96  97 124  85  129    THYROID   TSH   2.173              CA 19-9         20.2       CEA         1.7       HIV TESTS   HIV   NON REACTIVE             URINALYSIS   Color, Urine   AMBER             APPearance   CLEAR             Specific Gravity, Urine   1.025             pH   5.5             Glucose, UA   NEGATIVE             Bilirubin Urine   SMALL             Ketones, ur   NEGATIVE             Protein, ur   30             Urobilinogen, UA   0.2             Nitrite   NEGATIVE             Leukocytes, UA   NEGATIVE             Hgb urine dipstick   NEGATIVE             WBC, UA   7-10             RBC / HPF   0-2             Bacteria, UA   FEW             URINE CHEMISTRY   Osmolality, Ur    516            Legionella Antigen, Urine   Negative for Legionella pneumophilia serogroup 1             URINE, OTHER   Strep Pneumo Urinary Antigen   NEGATIVE             CHEMISTRY, FLUID   Amylase, Fluid        >10000  Fluid Type-FAMY        ABSCESS LT PERI RENAL        Fluid Type-FLDH        ABSCESS LT PERI RENAL        LD, Fluid        2954        Lipase-Fluid        >2000        Fluid Type - LIPFL        ABSCESS LT PERI RENAL   Blood cultures negative Urine culture negative  CT guided aspiration of perinephric fluid collection on Gram stain showed a few gram-negative rods, moderate white cells. Culture grew out yeast, Candida species  Cytology .LEFT PERINEPHRIC COLLECTION FLUID: FINDINGS CONSISTENT WITH  THE CONTENTS OF A CYST, A NON-DIAGNOSTIC PICTURE, SEE COMMENT. COMMENT: ALTHOUGH NO MALIGNANT CELLS ARE PRESENT IN THE SPECIMEN AS SAMPLED, THERE ARE NO DEFINITIVE CYST LINING CELLS IDENTIFIED FOR A MORE DEFINITIVE DIAGNOSIS. THEREFORE, THE SPECIMEN IS ULTIMATELY NON-DIAGNOSTIC. CLOSE CORRELATION WITH CLINICAL AND RADIOLOGIC IMPRESSION IS RECOMMENDED.  Diagnostics:  Dg Skull 1-3 Views  11/01/2012  *RADIOLOGY REPORT*  Clinical Data: Pre MRI.  SKULL - 1-3 VIEW  Comparison: None.  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  Interventricular shunt is in place.  No acute abnormality. Prominent hyperostosis frontalis interna.  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  This is a contraindication to MRI.   Original Report Authenticated By: Francene Boyers, M.D.    Ct Chest W Contrast  10/30/2012  *RADIOLOGY REPORT*  Clinical Data: History of lung cancer with weight loss.  Follow-up pulmonary nodule.  CT CHEST WITH CONTRAST  Technique:  Multidetector CT imaging of the chest was performed following the standard protocol during bolus administration of intravenous contrast.  Contrast: 80mL OMNIPAQUE IOHEXOL 300 MG/ML  SOLN  Comparison: Chest CT 04/22/2012.  Abdominal pelvic CT 07/28/2012.  Findings: There is stable volume loss in the right hemithorax status post right upper lobe resection.  The spiculated right lower lobe nodule has not significantly changed, measuring 2.0 x 1.5 cm on image 38. A 4 mm retrosternal nodule in the right middle lobe on image 26 is unchanged.  The scarring superior to the aortic arch in the left upper lobe has improved.  There is no evidence of focal mass in this area.  There is new left lower lobe atelectasis adjacent to an enlarging left pleural effusion. Emphysematous changes are again noted.  The mediastinum has a stable appearance with aortic atherosclerosis and central enlargement of the  pulmonary arteries.  There are no enlarged mediastinal or hilar lymph nodes.  There is a moderate sized dependent left pleural effusion.  No significant pleural fluid is seen on the right.  There is no pericardial effusion.  Images through the upper abdomen demonstrate an enlarging perinephric fluid collection on the left with increased mass effect on the left kidney.  This is incompletely imaged, but measures up to 8.1 x 6.1 cm transverse on image #6 of series 8.  There is a new complex splenic subcapsular fluid collection with intermediate and low density components.  The low density component measures 8.3 x 5.7 cm transverse on image 47.  The higher density component beneath the left hemidiaphragm extends anteriorly and is suspicious for a subcapsular hematoma or infection.  There is an abnormal density inferior to the left hepatic lobe and adjacent to the stomach  which does not appear to reflect the previously demonstrated redundant transverse colon.  Based on the reformatted images, this appears contiguous with the splenic process and is suspicious for extracapsular extension.  There is persistent colonic wall thickening at the hepatic flexure.  IMPRESSION:  1.  No significant change in spiculated right lower lobe mass remaining very concerning for bronchogenic carcinoma based on morphology. 2.  No other evidence of intrathoracic malignancy status post right upper lobe resection. 3.  Moderate sized left pleural effusion with associated left lower lobe atelectasis.  This pleural fluid is likely related to the progressive subphrenic process. 4. Enlarging left perinephric fluid collection with new left subphrenic fluid collections, likely representing splenic subcapsular hematoma or abscess.  There is concern of extracapsular extension of this process anteriorly between the stomach and left hepatic lobe.  Percutaneous aspiration/drainage should be considered.  These results were called by telephone on 10/30/2012  at 1005 hours to Dr. Lendell Caprice, who verbally acknowledged these results.   Original Report Authenticated By: Carey Bullocks, M.D.    Ct Abdomen W Contrast  11/08/2012  *RADIOLOGY REPORT*  Clinical Data: 66 year old female with upper abdominal collections/hematomas  with continued abdominal pain.  CT ABDOMEN WITH CONTRAST  Technique:  Multidetector CT imaging of the abdomen was performed following the standard protocol during bolus administration of intravenous contrast.  Contrast:  100 ml intravenous Omnipaque-300  Comparison: 10/31/2012 and 07/28/2012 CTs  Findings: Evolutionary changes of the large rim enhancing left subdiaphragmatic collection, now with decreased density. The entire collection measures 13 x 20 x 7.5 cm with layering high-density. This collection measures 10 x 10 cm along the posterior-superior aspect of the spleen (image 10) which is stable since the prior study.  The anterior inferior aspect of this collection between the stomach and liver has increased in size but is also of lower attenuation, now measuring 5.8 x 12.5 cm (image 24), previously measuring 5.1 x 10 cm at the same level.  A 5.2 x 7 cm left perirenal rim enhancing collection (image 24) has decreased in size since 10/31/2012, which, however, was a pre- aspiration study. A small to moderate left pleural effusion and bibasilar atelectasis are unchanged.  The right kidney, adrenal glands and visualized pancreas are unremarkable. Cholelithiasis again noted without evidence of acute cholecystitis. Diffuse subcutaneous edema is noted.  IMPRESSION: Evolutionary changes of large left subdiaphragmatic collection/hematoma, stable in size in the left side left subdiaphragmatic region but slightly increased in size between the stomach and liver.  Decreasing size of left pararenal collection/abscess since the pre aspiration study.  Unchanged small to moderate left pleural effusion, bibasilar atelectasis and cholelithiasis.   Original Report  Authenticated By: Harmon Pier, M.D.    Ct Abdomen W Contrast  10/31/2012  *RADIOLOGY REPORT*  Clinical Data: Evaluate complex left upper quadrant fluid collection  CT ABDOMEN WITH CONTRAST  Technique:  Multidetector CT imaging of the abdomen was performed following the standard protocol during bolus administration of intravenous contrast.  Contrast: OMNIPAQUE IOHEXOL 300 MG/ML  SOLN  Comparison: CT scan of the chest 10/30/2012; CT scan abdomen/pelvis 07/28/2012  Findings:  Lower Chest:  No significant interval change in the appearance of the moderate left layering pleural effusion and left lower lobe atelectasis.  Additionally, the previously identified spiculated nodule in the periphery of the right lower lobe is also unchanged at 2 x 1.5 cm. Stable visualized cardiac and mediastinal structures.  No pericardial effusion.  Abdomen: Interval development of a complex fluid collection in the left subdiaphragmatic  space which measures 10 x 9.2 x 6.3 cm in greatest transverse dimension (AP by CC by TR).  This fluid collection is intimately associated with the upper pole of the spleen displacement inferiorly and exerting mass effect on the cortex.  This fluid collection is likely within the subcapsular space.  There is a well-defined attenuation differential within the collection.  The cephalad aspect of the collection is water attenuation while inferiorly the collection is of intermediate attenuation ranging from 49-64.  The collection appears to extend anteriorly between the left hepatic lobe and over the fundus of the stomach and then inferiorly into the gastrohepatic ligament.  This portion of the collection is all of relatively high attenuation.  Interval enlargement of complex multiloculated subcapsular fluid collection about the left kidney. Measuring along similar landmarks as a prior study, the anterior collection measures 6.8 x 5.9 cm compared 3.0 x 2.1 cm previously at the upper pole of the kidney, and  6.1 x 4.2 cm compared to 3.3 x 1.6 cm in the interpolar region. The collections are low attenuation centrally but demonstrate a thick enhancing capsule.  The collection does abut the inferior margin of the complex cystic collection in the subdiaphragmatic space.  The tail of the pancreas is also interposition between these two complex subcapsular fluid collections and cannot be separated from them. High attenuation material layering within the gallbladder consistent with sludge and small stones.  No evidence of bowel obstruction.  Left adrenal gland is displaced by the subdiaphragmatic fluid collection.  The right adrenal gland is within normal limits.  Right kidney is within normal limits.  Bones: No acute fracture or aggressive appearing lytic or blastic osseous lesion.  Vascular: Scattered atherosclerotic vascular calcification.  No evidence of active extravasation of contrast on the delayed images.  IMPRESSION:  1.  Interval development of a large, complex left subdiaphragmatic fluid collection the epicenter of which appears to be arising from the subcapsular space of the spleen.  The fluid collection demonstrates clear fluid-fluid levels with high attenuation layering inferiorly and extending anteriorly over the gastric fundus and into the gastrohepatic ligament.  The high attenuation may represent hematoma and / or enhancing soft tissue.  This complex fluid collection abuts an enlarging multiloculated cystic collection in the subcapsular space of the left kidney.  The pancreatic tail is sandwich between these two complex collections and is difficult to separate from the collections.  The findings are most consistent with a very complex multiloculated predominately cystic mass with areas of likely hemorrhage/hematoma and/or enhancing soft tissue.  Differential considerations include a mucinous cystic neoplasm of the pancreas with superimposed hemorrhage, a hemorrhagic pancreatic pseudocyst, and less likely  (given the patients improving clinical status) an abscess collection with a hemorrhagic component.  A mass lesion such as a neoplasm, or potentially a hemorrhagic pseudocyst is favored given the degree of mass effect on the surrounding structures.  Consider MRI with and without contrast to evaluate for an enhancing soft tissue complement which would favor a diagnosis of neoplasm. If the patient begins to develop signs and symptoms of infection/sepsis, CT guided aspiration could be performed to evaluate the fluid content of the more cystic portions of the complex collection.  I would hesitate to place a drain in these collections until neoplasm is ruled out to prevent seeding of the draining tract.  Importantly, there is no evidence of active bleeding on the delayed phase images.  These results and recommendations were called by telephone on 10/31/2012 at approximately 02:00 p.m.  to Dr. Lendell Caprice, who verbally acknowledged these results.   Original Report Authenticated By: Malachy Moan, M.D.    Ct Aspiration  11/03/2012  *RADIOLOGY REPORT*  Indication: Intra-abdominal fluid collection, concern for pancreatic pseudocyst  CT GUIDED LEFT PERINEPHRIC FLUID ASPIRATION  Comparison: CT abdomen pelvis - 10/31/2012; 07/28/2012; 07/10/2012  Medications: Fentanyl 50 mcg IV; Versed 1 mg IV  Total Moderate Sedation time: 9 minutes  Contrast: None  Complications: None immediate  Technique / Findings:  Informed written consent was obtained from the patient after a discussion of the risks, benefits and alternatives to treatment. The patient was placed supine, slightly RPO on the CT gantry and a pre procedural CT was performed re-demonstrating the known abscess/fluid collection adjacent to the anterior-inferior aspect of the left kidney.  The procedure was planned.   A timeout was performed prior to the initiation of the procedure.  The left lateral abdomen was prepped and draped in the usual sterile fashion.   The overlying  soft tissues were anesthetized with 1% lidocaine with epinephrine.  Appropriate trajectory was planned with the use of a 22 gauge spinal needle.  A 5-French Yueh sheath needle was advanced into the abscess/fluid collection and approximately 120 ml of serous fluid was aspirated.  A dressing was placed.  The patient tolerated the procedure well without immediate post procedural complication.  Impression:  Successful CT guided aspiration of approximately 120 ml of serous fluid from the left-sided perinephric fluid collection.  A drain was not placed as there is concern this collection may represent a pancreatic pseudocyst.  Samples were sent to the laboratory as requested by the ordering clinical team.  Amylase, lipase and LDH levels were ordered on aspirated specimens.   Original Report Authenticated By: Tacey Ruiz, MD    Dg Chest Port 1 View  10/29/2012  *RADIOLOGY REPORT*  Clinical Data: Cough, CHF, infiltrate  PORTABLE CHEST - 1 VIEW  Comparison: 04/22/2012; chest CT - 04/22/2012  Findings:  Unchanged enlarged cardiac silhouette and mediastinal contours. Atherosclerotic calcifications within the thoracic aorta.  Stable postsurgical change of the right lung and right hemithorax.  Left basilar heterogeneous air space opacities.  No focal right-sided airspace opacities.  Grossly unchanged nodular, slightly spiculated opacity overlying the peripheral aspect of the right lung corresponding to the nodule seen on recent chest CT.  Grossly unchanged bones including extensive postsurgical change of the superior aspect of the right thorax.  Ventriculoperitoneal tubing courses along the midline of the left hemithorax.  IMPRESSION: 1.  Left basilar heterogeneous possible air space opacities worrisome for infection. A follow-up chest radiograph in 4 to 6 weeks after treatment is recommended to ensure resolution. 2.  Grossly unchanged nodular opacity overlying the peripheral aspect of the right lower lung as demonstrated  on recent chest CT. This nodule again remains concerning for malignancy.   Original Report Authenticated By: Tacey Ruiz, MD    Procedures: CT guided aspiration of left peri-renal fluid collection  EKG: Sinus tachycardia artifact and ectopic beats  Full Code   Hospital Course: Ms. Siler is a medically complex 66 year old black female who presented to the emergency room with shortness of breath. She has a history of lung cancer, COPD, and chronic respiratory failure requiring home oxygen. She summoned EMS for acute shortness of breath.  She had respiratory distress and labored breathing. She was therefore placed on BiPAP en route to the emergency room. Chest x-ray showed left basilar opacity concerning for pneumonia. Right nodular opacity unchanged from previous. Spiculated lung  nodule has been followed at [facility] with serial scans. She continues to smoke cigarettes. She reports that her appetite has worsened and she has lost about 20 pounds over the past 2 months. Her respiratory rate was 24. Vital signs otherwise unremarkable on BiPAP. She was noted to be cachectic-appearing in the emergency room. Difficult to understand, BiPAP related. She was tachypneic with bilateral rhonchi and wheeze. Prolonged expiratory phase. Abdomen was soft and nontender. She had no peripheral edema. Her sodium was 124. Glucose 212. White blood cell count 12,000. Hemoglobin 9.6. Hematocrit 29.4. She has a chronic anemia and had a bleeding ulcer earlier this year. Patient was started on antibiotics, steroids, bronchodilators. BiPAP was weaned off within 24 hours. She has completed her course of therapy for pneumonia. Steroids had been tapered off. She remains on her continuous oxygen by nasal cannula.  Because of her history of lung cancer and nodule, a CT chest was done to reevaluate. It showed a pleural effusion, unchanged lung nodule. Surprisingly, also showed increased size of subphrenic fluid collection. In August of this  year, she had a colonoscopy which showed abnormal thickening of the proximal sigmoid or descending colon concerning for neoplasm. It was biopsied and showed no malignancy but chronic inflammatory changes. A CAT scan was done which showed abnormal perinephric fluid collection. Patient had no abdominal pain at that time. She had no urinary symptoms. Her urine was clean, but she was admitted and started on IV antibiotics initially. GI and surgery were consulted. They recommended empiric antibiotics for 2 weeks then rescan. Patient was sent home on oral antibiotics. Repeat CT scan in 2 weeks showed no change. Patient was referred to [facility], Dr. Leone Haven, as an outpatient. He had scheduled. Patient has a history of cerebral aneurysm repair with clips and a VP shunt, so MRI is contraindicated. CT of the abdomen and pelvis was done. It showed probable hemorrhage in to the splenic area, and increase in size of the peri-renal fluid collection. Please see above. Because of patient's weight loss and cancer history, there was concern about cystic neoplasm though continued infection and hemorrhage into the area was of concern. Patient had not been on anticoagulation prior to admission, but did note a fall several weeks prior to admission. She sustained a lip laceration but reported no bodily trauma. Her hemoglobin did drop and she was taken off subcutaneous Lovenox, and SCDs placed. She likely had hemorrhage around the splenic area. The patient has required 1 unit of packed red blood cell transfusion. Interventional radiology aspirated the peri-renal area which appeared to be cystic. Fluid analysis revealed Amylase and lipase were elevated.  Cytology showed no malignant cells. There is concern that this is an infected pancreatic pseudocyst. Patient had no definite pancreatitis symptoms at any point however. Her CEA is normal. CA 19-9 slightly elevated at 20. Her antibiotics were adjusted, as Gram stain of the fluid collection  showed few gram-negative rods. Culture showed Candida species and Diflucan was added.  She has had Zosyn since 12/4. Diflucan since 12/7. Dr. Karilyn Cota has recommended endoscopic ultrasound.  Patient will be transferred to [facility] Dr. Dionicia Abler has spoken to Dr. Arlyce Dice who has kindly accepted the patient in transfer.  Total time spent 60 minutes.  Discharge Exam:  Blood pressure 137/75, pulse 89, temperature 98.5 F (36.9 C), temperature source Oral, resp. rate 20, height 5\' 7"  (1.702 m), weight 61.6 kg (135 lb 12.9 oz), SpO2 97.00%.  Gen.: Nontoxic. Alert and oriented. Fairly comfortable. Lungs  clear to auscultation bilaterally without wheezes rhonchi or rales Abdomen distended soft nontender Extremities no clubbing cyanosis or edema Cardiovascular regular rate rhythm without murmurs gallops rubs  Signed: Esther Broyles L 11/08/2012, 12:12 PM

## 2012-11-08 NOTE — Progress Notes (Signed)
Subjective; Patient complains of fullness and.epigastric pain which is more than yesterday. She denies chest pain or diarrhea. She is not hungry denies nausea or vomiting. Objective; BP 137/75  Pulse 89  Temp 98.5 F (36.9 C) (Oral)  Resp 20  Ht 5\' 7"  (1.702 m)  Wt 135 lb 12.9 oz (61.6 kg)  BMI 21.27 kg/m2  SpO2 97% She has few coarse rhonchi at left base. Abdomen is full. It is soft with mild tenderness at mid epigastric region. Lab data; WBC 18.3, H&H 8.5 and 26.4, platelet count 398K. Serum sodium 134, potassium 3.6, chloride 94, CO2 33, glucose 129, BUN 9, creatinine oh 0.41, calcium 9.1, bilirubin oh 0.5, AP 147, AST 39, ALT 44, total protein 6.2 and albumin 2.1. Abdominal Pelvic CT reviewed with Dr. Lowella Dandy. There is increase in the fluid collection between the stomach and liver. Left diaphragmatic fluid collection/hematoma is stable and left peri-renal fluid collection is decreased secondary to aspiration. No obvious pancreatic lesions seen. Small gallstones with normal sites bile duct VP shunt catheter in place. CEA 1.7. Fluid cultures with gram-negative rods but no further info available. Cytology was negative for malignant cells. Assessment; Patient's symptom complex would appear to be secondary to complicated pancreatitis. Fluid collection between stomach and liver is increased and would account for epigastric pain and tenderness. Suspect she may have small pancreatic lesion resulting in indolent process as she is never experienced acute pancreatitis. I doubt that VP shunt has anything to do with pancreatic injury. Even patient's clinical course she needs number of things which include nasal antral feeding or TPN as well as EUS and ERCP. She may also need to have fluid collection drained either internally or externally. Anemia; change is down some but no evidence of active bleeding. Therefore I have contacted Dr. Arlyce Dice of [facility] and he has kindly agreed to accept patient in transfer.  Patient had been seen by Dr. Dr. Joselyn Glassman in September 2013 for this problem now it is much worse. Patient and her husband are agreeable for her to be transferred to [facility].

## 2012-11-09 NOTE — Telephone Encounter (Signed)
No answer and no voice mail instructions have been mailed

## 2012-11-10 NOTE — Telephone Encounter (Signed)
Per Dr Christella Hartigan the pt has been cx and Noreene Larsson has been notified

## 2012-11-16 ENCOUNTER — Encounter (HOSPITAL_COMMUNITY): Payer: Self-pay

## 2012-11-16 ENCOUNTER — Ambulatory Visit (HOSPITAL_COMMUNITY): Admit: 2012-11-16 | Payer: Self-pay | Admitting: Gastroenterology

## 2012-11-16 SURGERY — UPPER ENDOSCOPIC ULTRASOUND (EUS) LINEAR
Anesthesia: Monitor Anesthesia Care

## 2013-02-14 ENCOUNTER — Inpatient Hospital Stay (HOSPITAL_COMMUNITY)
Admission: EM | Admit: 2013-02-14 | Discharge: 2013-02-17 | DRG: 193 | Disposition: A | Discharge status: Hospice | Attending: Internal Medicine | Admitting: Internal Medicine

## 2013-02-14 ENCOUNTER — Emergency Department (HOSPITAL_COMMUNITY)

## 2013-02-14 DIAGNOSIS — A419 Sepsis, unspecified organism: Secondary | ICD-10-CM

## 2013-02-14 DIAGNOSIS — E43 Unspecified severe protein-calorie malnutrition: Secondary | ICD-10-CM

## 2013-02-14 DIAGNOSIS — Z66 Do not resuscitate: Secondary | ICD-10-CM | POA: Diagnosis present

## 2013-02-14 DIAGNOSIS — R634 Abnormal weight loss: Secondary | ICD-10-CM

## 2013-02-14 DIAGNOSIS — E871 Hypo-osmolality and hyponatremia: Secondary | ICD-10-CM

## 2013-02-14 DIAGNOSIS — Z85118 Personal history of other malignant neoplasm of bronchus and lung: Secondary | ICD-10-CM

## 2013-02-14 DIAGNOSIS — D62 Acute posthemorrhagic anemia: Secondary | ICD-10-CM

## 2013-02-14 DIAGNOSIS — Z9221 Personal history of antineoplastic chemotherapy: Secondary | ICD-10-CM

## 2013-02-14 DIAGNOSIS — K651 Peritoneal abscess: Secondary | ICD-10-CM

## 2013-02-14 DIAGNOSIS — Z79899 Other long term (current) drug therapy: Secondary | ICD-10-CM

## 2013-02-14 DIAGNOSIS — Z8249 Family history of ischemic heart disease and other diseases of the circulatory system: Secondary | ICD-10-CM

## 2013-02-14 DIAGNOSIS — R739 Hyperglycemia, unspecified: Secondary | ICD-10-CM

## 2013-02-14 DIAGNOSIS — I10 Essential (primary) hypertension: Secondary | ICD-10-CM

## 2013-02-14 DIAGNOSIS — D649 Anemia, unspecified: Secondary | ICD-10-CM

## 2013-02-14 DIAGNOSIS — Z982 Presence of cerebrospinal fluid drainage device: Secondary | ICD-10-CM

## 2013-02-14 DIAGNOSIS — J9620 Acute and chronic respiratory failure, unspecified whether with hypoxia or hypercapnia: Secondary | ICD-10-CM

## 2013-02-14 DIAGNOSIS — Z9981 Dependence on supplemental oxygen: Secondary | ICD-10-CM

## 2013-02-14 DIAGNOSIS — K219 Gastro-esophageal reflux disease without esophagitis: Secondary | ICD-10-CM | POA: Diagnosis present

## 2013-02-14 DIAGNOSIS — K922 Gastrointestinal hemorrhage, unspecified: Secondary | ICD-10-CM

## 2013-02-14 DIAGNOSIS — Z833 Family history of diabetes mellitus: Secondary | ICD-10-CM

## 2013-02-14 DIAGNOSIS — Z902 Acquired absence of lung [part of]: Secondary | ICD-10-CM

## 2013-02-14 DIAGNOSIS — Z720 Tobacco use: Secondary | ICD-10-CM

## 2013-02-14 DIAGNOSIS — Z681 Body mass index (BMI) 19 or less, adult: Secondary | ICD-10-CM

## 2013-02-14 DIAGNOSIS — Z8679 Personal history of other diseases of the circulatory system: Secondary | ICD-10-CM

## 2013-02-14 DIAGNOSIS — F411 Generalized anxiety disorder: Secondary | ICD-10-CM | POA: Diagnosis present

## 2013-02-14 DIAGNOSIS — J189 Pneumonia, unspecified organism: Principal | ICD-10-CM

## 2013-02-14 DIAGNOSIS — E039 Hypothyroidism, unspecified: Secondary | ICD-10-CM

## 2013-02-14 DIAGNOSIS — D509 Iron deficiency anemia, unspecified: Secondary | ICD-10-CM

## 2013-02-14 DIAGNOSIS — J449 Chronic obstructive pulmonary disease, unspecified: Secondary | ICD-10-CM

## 2013-02-14 DIAGNOSIS — F17200 Nicotine dependence, unspecified, uncomplicated: Secondary | ICD-10-CM | POA: Diagnosis present

## 2013-02-14 DIAGNOSIS — J441 Chronic obstructive pulmonary disease with (acute) exacerbation: Secondary | ICD-10-CM

## 2013-02-14 LAB — BASIC METABOLIC PANEL
Chloride: 90 mEq/L — ABNORMAL LOW (ref 96–112)
GFR calc non Af Amer: >90 mL/min (ref >90)
Glucose, Bld: 131 mg/dL — ABNORMAL HIGH (ref 70–99)
Potassium: 3.7 mEq/L (ref 3.5–5.1)
Sodium: 131 mEq/L — ABNORMAL LOW (ref 135–145)

## 2013-02-14 LAB — BLOOD GAS, ARTERIAL
Bicarbonate: 34.3 mEq/L — ABNORMAL HIGH (ref 20.0–24.0)
Patient temperature: 37
pH, Arterial: 7.417 (ref 7.350–7.450)

## 2013-02-14 LAB — CBC WITH DIFFERENTIAL/PLATELET
Eosinophils Absolute: 0 10*3/uL (ref 0.0–0.7)
Lymphocytes Relative: 4 % — ABNORMAL LOW (ref 12–46)
Lymphs Abs: 0.5 10*3/uL — ABNORMAL LOW (ref 0.7–4.0)
MCH: 24.2 pg — ABNORMAL LOW (ref 26.0–34.0)
Neutro Abs: 11 10*3/uL — ABNORMAL HIGH (ref 1.7–7.7)
Neutrophils Relative %: 89 % — ABNORMAL HIGH (ref 43–77)
Platelets: 407 10*3/uL — ABNORMAL HIGH (ref 150–400)
RBC: 3.76 MIL/uL — ABNORMAL LOW (ref 3.87–5.11)
WBC: 12.4 10*3/uL — ABNORMAL HIGH (ref 4.0–10.5)

## 2013-02-14 MED ORDER — SODIUM CHLORIDE 0.9 % IV BOLUS (SEPSIS)
500.0000 mL | Freq: Once | INTRAVENOUS | Status: AC
  Administered 2013-02-14: 500 mL via INTRAVENOUS

## 2013-02-14 MED ORDER — LEVOFLOXACIN IN D5W 750 MG/150ML IV SOLN
750.0000 mg | Freq: Once | INTRAVENOUS | Status: AC
  Administered 2013-02-14: 750 mg via INTRAVENOUS
  Filled 2013-02-14: qty 150

## 2013-02-14 MED ORDER — VANCOMYCIN HCL IN DEXTROSE 1-5 GM/200ML-% IV SOLN
1000.0000 mg | Freq: Once | INTRAVENOUS | Status: DC
  Filled 2013-02-14: qty 200

## 2013-02-14 MED ORDER — DEXTROSE 5 % IV SOLN: 2.0000 g | Freq: Once | INTRAVENOUS | Status: DC

## 2013-02-14 MED ORDER — METHYLPREDNISOLONE SODIUM SUCC 125 MG IJ SOLR
125.0000 mg | Freq: Once | INTRAMUSCULAR | Status: AC
  Administered 2013-02-14: 125 mg via INTRAVENOUS
  Filled 2013-02-14: qty 2

## 2013-02-14 MED ORDER — IPRATROPIUM BROMIDE 0.02 % IN SOLN
0.5000 mg | Freq: Once | RESPIRATORY_TRACT | Status: AC
  Administered 2013-02-14: 0.5 mg via RESPIRATORY_TRACT
  Filled 2013-02-14: qty 2.5

## 2013-02-14 MED ORDER — ALBUTEROL (5 MG/ML) CONTINUOUS INHALATION SOLN
10.0000 mg | INHALATION_SOLUTION | RESPIRATORY_TRACT | Status: AC
  Administered 2013-02-14: 10 mg via RESPIRATORY_TRACT
  Filled 2013-02-14: qty 20

## 2013-02-14 NOTE — H&P (Signed)
History and Physical  Tracey Martinez:[id number] DOB: 04/19/1946 DOA: 02/14/2013  Referring physician:  PCP: Ninfa Linden, FNP   Chief Complaint: Shortness of breath  HPI: Tracey Martinez is a 67 y.o. female with past medical history most significant for severe COPD, chronic respiratory failure requiring home oxygen and lung cancer wall has been followed at [facility] with serial scans was brought in by ambulance to the Emergency Department complaining of 4 days of gradual onset, gradually worsening, constant SOB with associated cough productive of yellow sputum. She denies fevers. She is on 3L O2 at home and does albuterol and spiriva breathing treatments daily. She reports using both her oxygen and her breathing treatments with no improvement. She reports that she has been eating and drinking normally since the onset. She states that she called EMS due to shortness of breath and associated anxiety attack which she has a history of. 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 She is currently being followed by Dr. Joselyn Glassman at [facility]. She denies emesis, diarrhea and known fevers as associated symptoms. She is a current everyday smoker but denies alcohol use. Respiratory rate is about 34, she is tachycardic, elevated white cell counts and was found to have a fever. Patient's chest x-ray was also consisting of right lower lobe pneumonia in ER physician asked me to admit the patient for above.  Review of Systems:  Constitutional: Negative for fever.  Respiratory: Positive for cough and shortness of breath.  Gastrointestinal: Negative for nausea, vomiting and abdominal pain.  15 point review of system was reviewed and is negative except what is noted above.    Past Medical History  Diagnosis Date  . Aneurysm     brain  . GERD (gastroesophageal reflux disease)   . Hypertension   . Anxiety   . Oxygen dependent     requires oxygen for at  least 16 hours per day,   . Hypothyroidism   . COPD (chronic obstructive pulmonary disease)     on home oxygen  . Anemia     iron deficiency anemia  . Gastric erosions     causing anemia  . Chronic respiratory failure   . Anemia   . Gastric ulcer   . Borderline diabetes   . Cancer   . Lung cancer   . 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 H/O cerebral aneurysm repair 11/08/2012    Past Surgical History  Procedure Laterality Date  . Lung removal, partial    . Abdominal hysterectomy    . Esophagogastroduodenoscopy  05/11/2012    Procedure: ESOPHAGOGASTRODUODENOSCOPY (EGD);  Surgeon: Malissa Hippo, MD;  [facility];  Service: Endoscopy;  Laterality: N/A;  . Cataract extraction, bilateral      Social History:  reports that she has been smoking.  She does not have any smokeless tobacco history on file. She reports that she does not drink alcohol or use illicit drugs.  Allergies  Allergen Reactions  . Sulfa Antibiotics Other (See Comments)    HANDS,FEET AND MOUTH BLISTERING    Family History  Problem Relation Age of Onset  . Hypertension Mother   . Hypertension Father   . Diabetes Mother   . Cancer Brother   . Diabetes Brother   . Diabetes Brother      Prior to Admission medications   Medication Sig Start Date End Date Taking? Authorizing Provider  acetaminophen (TYLENOL) 500 MG tablet  Take 1,000 mg by mouth every 4 (four) hours as needed. For pain    Historical Provider, MD  albuterol (PROVENTIL) (2.5 MG/3ML) 0.083% nebulizer solution Take 2.5 mg by nebulization 2 (two) times daily.     Historical Provider, MD  amitriptyline (ELAVIL) 25 MG tablet Take 50 mg by mouth at bedtime.    Historical Provider, MD  arformoterol (BROVANA) 15 MCG/2ML NEBU Take 15 mcg by nebulization 2 (two) times daily.    Historical Provider, MD  atorvastatin (LIPITOR) 20 MG tablet Take 20 mg by mouth daily.    Historical Provider, MD  busPIRone (BUSPAR) 10 MG tablet Take  10 mg by mouth 3 (three) times daily as needed. For anxiety    Historical Provider, MD  CALCIUM-MAGNESIUM-ZINC PO Take 1 tablet by mouth every morning.     Historical Provider, MD  cholecalciferol (VITAMIN D) 1000 UNITS tablet Take 1,000 Units by mouth every morning.     Historical Provider, MD  fluticasone (FLONASE) 50 MCG/ACT nasal spray Place 2 sprays into the nose every morning.    Historical Provider, MD  levothyroxine (SYNTHROID, LEVOTHROID) 88 MCG tablet Take 88 mcg by mouth daily before breakfast.     Historical Provider, MD  lisinopril (PRINIVIL,ZESTRIL) 20 MG tablet Take 10 mg by mouth every morning.    Historical Provider, MD  omeprazole (PRILOSEC) 40 MG capsule Take 1 capsule (40 mg total) by mouth 2 (two) times daily. 05/12/12   Erick Blinks, MD  theophylline (THEO-24) 300 MG 24 hr capsule Take 300 mg by mouth 2 (two) times daily.    Historical Provider, MD  tiotropium (SPIRIVA) 18 MCG inhalation capsule Place 18 mcg into inhaler and inhale daily.    Historical Provider, MD  venlafaxine XR (EFFEXOR-XR) 150 MG 24 hr capsule Take 300 mg by mouth daily with breakfast.    Historical Provider, MD   Physical Exam: Filed Vitals:   02/14/13 2114 02/14/13 2117 02/14/13 2201 02/14/13 2222  BP:  165/77  115/102  Pulse: 122   112  Temp: 100.7 F (38.2 C)     TempSrc: Rectal     Resp: 44   34  Height: 5\' 7"  (1.702 m)     Weight: 115 lb (52.164 kg)     SpO2: 93% 93% 93% 98%   Constitutional: She is oriented to person, place, and time. She appears cachectic. In some respiratory distress.  HENT: Head: Bitemporal marked muscle wasting, Normocephalic and atraumatic. Eyes: EOM are normal.  Neck: Neck supple. No tracheal deviation present.  Cardiovascular: Regular rhythm. Tachycardia present.  Pulmonary/Chest: No accessory muscle usage present. No respiratory distress. She has bilateral wheezes (diffuse), Increased work of breathing Abdominal: Soft. There is no tenderness. JP drain coming from  LUQ  Musculoskeletal: Normal range of motion.  Neurological: She is alert and oriented to person, place, and time.  Skin: Skin is warm and dry.  Psychiatric: She has a normal mood and affect. Her behavior is normal.    Wt Readings from Last 3 Encounters:  02/14/13 115 lb (52.164 kg)  10/31/12 135 lb 12.9 oz (61.6 kg)  07/23/12 133 lb 4.8 oz (60.464 kg)    Labs on Admission:  Basic Metabolic Panel:  Recent Labs Lab 02/14/13 2151  NA 131*  K 3.7  CL 90*  CO2 33*  GLUCOSE 131*  BUN 5*  CREATININE 0.48*  CALCIUM 9.5    CBC:  Recent Labs Lab 02/14/13 2151  WBC 12.4*  NEUTROABS 11.0*  HGB 9.1*  HCT 29.3*  MCV 77.9*  PLT 407*    Radiological Exams on Admission: Dg Chest Port 1 View  02/14/2013  *RADIOLOGY REPORT*  Clinical Data: Shortness of breath.  History of oxygen dependent COPD and prior history of lung cancer with right upper lobectomy.  PORTABLE CHEST - 1 VIEW 02/14/2013 2133 hours:  Comparison: CT chest 10/30/2012 and portable chest x-ray 10/29/2012, 04/22/2012.  Findings: Cardiac silhouette enlarged but stable.  Pulmonary vascularity normal without evidence of pulmonary edema. Pleuroparenchymal scarring in the right upper chest related to the prior right upper lobectomy.  New patchy airspace opacities in the right lower lobe.  No new pulmonary parenchymal abnormalities elsewhere.  IMPRESSION: Right lower lobe pneumonia.  Pleuroparenchymal scarring related to the prior right upper lobectomy.  Stable cardiomegaly without pulmonary edema.   Original Report Authenticated By: Hulan Saas, M.D.     EKG: Independently reviewed. 115 beats per minute, sinus rhythm, normal axis, nonspecific ST and T wave changes    Principal Problem:   Acute-on-chronic respiratory failure Active Problems:   HTN (hypertension)   Intra-abdominal abscess   Hypothyroidism   Hyponatremia   Severe protein-calorie malnutrition   H/O cerebral aneurysm repair   Assessment/Plan Patient  is 67 year old female with past medical history most significant for chronic respiratory failure from COPD who continues to smoke and was brought in by emergency personnel for acute shortness of breath. Patient is likely suffering from acute respiratory failure on her chronic respiratory failure secondary to pneumonia.  Acute on chronic respiratory failure from healthcare associated pneumonia: -Admit the patient to step down -Start on empiric coverage broad-spectrum Levaquin, cefepime and vancomycin for healthcare associated pneumonia(MRSA and double coverage for gram negatives like Pseudomonas as recommended by IDSA guidelines) -Albuterol and Spiriva -Patient received 1 dose of methylprednisolone in the ER, I will continue her on 50 mg prednisone by mouth daily -Blood cultures, urine antigen for Legionella and strep, sputum cultures -Never gone broad-spectrum antibiotic coverage after 48 hours -Patient does not require BiPAP at this time  Hyponatremia: This is chronic 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  -Start patient on normal saline  Intra-abdominal abscess: The patient had a followup with her surgeon one month ago. She said that the clinic never called her back for a followup appointment.  We will try and contact Dr. Joselyn Glassman in the morning to understand the plan of care regarding the removal of drain.  Rest of the medical management was continued as per home medications.  Code Status: Full Family Communication: None present at bedside Disposition Plan/Anticipated LOS: Guarded at this time  Time spent: 75  minutes  Lars Mage, MD  [facility] If 7PM-7AM, please contact night-coverage at [email or web address], password [facility] 02/14/2013, 11:12 PM

## 2013-02-14 NOTE — ED Notes (Signed)
Sob, all day. Had panic attack called 911

## 2013-02-14 NOTE — Progress Notes (Signed)
Checked on patient times 2 while on CAT sats 114 sat 95% pt states she is feeling better since she is getting contiunous treatment.

## 2013-02-14 NOTE — ED Provider Notes (Signed)
History  This chart was scribed for Vida Roller, MD by Bennett Scrape, ED Scribe. This patient was seen in room APA12/[facility]12 and the patient's care was started at 9:18 PM.  CSN: [id number]  Arrival date & time 02/14/13  2114   First MD Initiated Contact with Patient 02/14/13 2118      Chief Complaint  Patient presents with  . Shortness of Breath    The history is provided by the patient. No language interpreter was used.    Tracey Martinez is a 67 y.o. female with a h/o reactive airway disease and COPD brought in by ambulance who presents to the Emergency Department complaining of 4 days of gradual onset, gradually worsening, constant SOB with associated cough productive of yellow sputum. She is on 3L O2 at home and does albuterol and spiriva breathing treatments daily. She reports using both her oxygen and her breathing treatments with no improvement. She reports that she has been eating and drinking normally since the onset. She states that she called EMS due to an anxiety attack which she has a history of. She denies feeling anxious currently. 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 She denies emesis, diarrhea and known fevers as associated symptoms. She also has a h/o GERD, HTN and lung CA. She is a current everyday smoker but denies alcohol use.  Past Medical History  Diagnosis Date  . Aneurysm     brain  . GERD (gastroesophageal reflux disease)   . Hypertension   . Anxiety   . Oxygen dependent     requires oxygen for at least 16 hours per day,   . Hypothyroidism   . COPD (chronic obstructive pulmonary disease)     on home oxygen  . Anemia     iron deficiency anemia  . Gastric erosions     causing anemia  . Chronic respiratory failure   . Anemia   . Gastric ulcer   . Borderline diabetes   . Cancer   . Lung cancer   . 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 H/O cerebral aneurysm repair  11/08/2012    Past Surgical History  Procedure Laterality Date  . Lung removal, partial    . Abdominal hysterectomy    . Esophagogastroduodenoscopy  05/11/2012    Procedure: ESOPHAGOGASTRODUODENOSCOPY (EGD);  Surgeon: Malissa Hippo, MD;  Location: [facility];  Service: Endoscopy;  Laterality: N/A;  . Cataract extraction, bilateral      Family History  Problem Relation Age of Onset  . Hypertension Mother   . Hypertension Father   . Diabetes Mother   . Cancer Brother   . Diabetes Brother   . Diabetes Brother     History  Substance Use Topics  . Smoking status: Current Every Day Smoker -- 0.25 packs/day for 55 years  . Smokeless tobacco: Not on file  . Alcohol Use: No    No OB history provided.  Review of Systems  Constitutional: Negative for fever.  Respiratory: Positive for cough and shortness of breath.   Gastrointestinal: Negative for nausea, vomiting and abdominal pain.  All other systems reviewed and are negative.    Allergies  Sulfa antibiotics  Home Medications   Current Outpatient Rx  Name  Route  Sig  Dispense  Refill  . acetaminophen (TYLENOL) 500 MG tablet   Oral   Take 1,000 mg by mouth every 4 (four) hours as needed. For pain         .  albuterol (PROVENTIL) (2.5 MG/3ML) 0.083% nebulizer solution   Nebulization   Take 2.5 mg by nebulization 2 (two) times daily.          Marland Kitchen amitriptyline (ELAVIL) 25 MG tablet   Oral   Take 50 mg by mouth at bedtime.         Marland Kitchen arformoterol (BROVANA) 15 MCG/2ML NEBU   Nebulization   Take 15 mcg by nebulization 2 (two) times daily.         Marland Kitchen atorvastatin (LIPITOR) 20 MG tablet   Oral   Take 20 mg by mouth daily.         . busPIRone (BUSPAR) 10 MG tablet   Oral   Take 10 mg by mouth 3 (three) times daily as needed. For anxiety         . CALCIUM-MAGNESIUM-ZINC PO   Oral   Take 1 tablet by mouth every morning.          . cholecalciferol (VITAMIN D) 1000 UNITS tablet   Oral   Take 1,000  Units by mouth every morning.          . fluticasone (FLONASE) 50 MCG/ACT nasal spray   Nasal   Place 2 sprays into the nose every morning.         Marland Kitchen levothyroxine (SYNTHROID, LEVOTHROID) 88 MCG tablet   Oral   Take 88 mcg by mouth daily before breakfast.          . lisinopril (PRINIVIL,ZESTRIL) 20 MG tablet   Oral   Take 10 mg by mouth every morning.         Marland Kitchen omeprazole (PRILOSEC) 40 MG capsule   Oral   Take 1 capsule (40 mg total) by mouth 2 (two) times daily.   60 capsule   2   . theophylline (THEO-24) 300 MG 24 hr capsule   Oral   Take 300 mg by mouth 2 (two) times daily.         Marland Kitchen tiotropium (SPIRIVA) 18 MCG inhalation capsule   Inhalation   Place 18 mcg into inhaler and inhale daily.         Marland Kitchen venlafaxine XR (EFFEXOR-XR) 150 MG 24 hr capsule   Oral   Take 300 mg by mouth daily with breakfast.           Triage Vitals: BP 165/77  Pulse 122  Temp(Src) 100.7 F (38.2 C) (Rectal)  Resp 44  Ht 5\' 7"  (1.702 m)  Wt 115 lb (52.164 kg)  BMI 18.01 kg/m2  SpO2 93%  Physical Exam  Nursing note and vitals reviewed. Constitutional: She is oriented to person, place, and time. She appears well-developed and well-nourished. No distress.  Febrile (100.7)  HENT:  Head: Normocephalic and atraumatic.  Eyes: EOM are normal.  Neck: Neck supple. No tracheal deviation present.  Cardiovascular: Regular rhythm.  Tachycardia present.   Pulmonary/Chest: Accessory muscle usage present. No respiratory distress. She has wheezes (diffuse). She has rales.  Increased work of breathing, purulent phlegm, speaks in shortened sentences    Abdominal: Soft. There is no tenderness.  JP drain coming from LUQ  Musculoskeletal: Normal range of motion.  Neurological: She is alert and oriented to person, place, and time.  Skin: Skin is warm and dry.  Psychiatric: She has a normal mood and affect. Her behavior is normal.    ED Course  Procedures (including critical care  time)  DIAGNOSTIC STUDIES: Oxygen Saturation is 93% on 3L of O2, adequate by my interpretation.  COORDINATION OF CARE: 9:29 PM-Discussed treatment plan which includes breathing treatment, CXR, and CBC panel with pt at bedside and pt agreed to plan.   9:45 PM- Ordered 125 mg solumedrol injection, continuous albuterol nebulizer solution and 0.5 mg of Atrovent solution.  10:43 PM- Pt has RLL PNA. Will page hospitalist for admission. Pt is septic with fever and leukocytosis.    Labs Reviewed  CBC WITH DIFFERENTIAL - Abnormal; Notable for the following:    WBC 12.4 (*)    RBC 3.76 (*)    Hemoglobin 9.1 (*)    HCT 29.3 (*)    MCV 77.9 (*)    MCH 24.2 (*)    RDW 17.7 (*)    Platelets 407 (*)    Neutrophils Relative 89 (*)    Neutro Abs 11.0 (*)    Lymphocytes Relative 4 (*)    Lymphs Abs 0.5 (*)    All other components within normal limits  BASIC METABOLIC PANEL - Abnormal; Notable for the following:    Sodium 131 (*)    Chloride 90 (*)    CO2 33 (*)    Glucose, Bld 131 (*)    BUN 5 (*)    Creatinine, Ser 0.48 (*)    All other components within normal limits  BLOOD GAS, ARTERIAL - Abnormal; Notable for the following:    pCO2 arterial 54.3 (*)    pO2, Arterial 74.9 (*)    Bicarbonate 34.3 (*)    Acid-Base Excess 9.5 (*)    All other components within normal limits   Dg Chest Port 1 View  02/14/2013  *RADIOLOGY REPORT*  Clinical Data: Shortness of breath.  History of oxygen dependent COPD and prior history of lung cancer with right upper lobectomy.  PORTABLE CHEST - 1 VIEW 02/14/2013 2133 hours:  Comparison: CT chest 10/30/2012 and portable chest x-ray 10/29/2012, 04/22/2012.  Findings: Cardiac silhouette enlarged but stable.  Pulmonary vascularity normal without evidence of pulmonary edema. Pleuroparenchymal scarring in the right upper chest related to the prior right upper lobectomy.  New patchy airspace opacities in the right lower lobe.  No new pulmonary parenchymal  abnormalities elsewhere.  IMPRESSION: Right lower lobe pneumonia.  Pleuroparenchymal scarring related to the prior right upper lobectomy.  Stable cardiomegaly without pulmonary edema.   Original Report Authenticated By: Hulan Saas, M.D.      1. Sepsis   2. HCAP (healthcare-associated pneumonia)   3. Hyponatremia   4. Anemia       MDM  67 year old female who has had significant respiratory distress gradually worsening since Wednesday. Her vital signs reflect asepsis pattern with fever, tachycardia and hypoxia with significant increased respiratory rate. She is speaking in full sentences and at this time it does not appear that she is going to need inpatient admission with IV antibiotics and IV fluids for early sepsis. She has a very good blood pressure this time thus I doubt she is in septic shock. Antibiotics ordered, fluids, labs pending.   ED ECG REPORT  I personally interpreted this EKG   Date: 02/14/2013   Rate: 115  Rhythm: sinus tachycardia  QRS Axis: normal  Intervals: normal  ST/T Wave abnormalities: Nonspecific T wave changes  Conduction Disutrbances:none  Narrative Interpretation:   Old EKG Reviewed: Compared with 10/29/2012, nonspecific T waves now seen, rate is similar   D/w Dr. Eben Burow who will admit the pt to the hospital to the Step Down Unit.  I believe that she is in early sepsis and will need broad spec  abx at this time.  She is not in need of bipap at this time but if she was much more labored she would need it.  CRITICAL CARE Performed by: Vida Roller   Total critical care time: 35  Critical care time was exclusive of separately billable procedures and treating other patients.  Critical care was necessary to treat or prevent imminent or life-threatening deterioration.  Critical care was time spent personally by me on the following activities: development of treatment plan with patient and/or surrogate as well as nursing, discussions with consultants,  evaluation of patient's response to treatment, examination of patient, obtaining history from patient or surrogate, ordering and performing treatments and interventions, ordering and review of laboratory studies, ordering and review of radiographic studies, pulse oximetry and re-evaluation of patient's condition.   I personally performed the services described in this documentation, which was scribed in my presence. The recorded information has been reviewed and is accurate.         Vida Roller, MD 02/14/13 [phone]

## 2013-02-15 ENCOUNTER — Encounter (HOSPITAL_COMMUNITY): Payer: Self-pay

## 2013-02-15 DIAGNOSIS — Z85118 Personal history of other malignant neoplasm of bronchus and lung: Secondary | ICD-10-CM

## 2013-02-15 DIAGNOSIS — I10 Essential (primary) hypertension: Secondary | ICD-10-CM

## 2013-02-15 DIAGNOSIS — J441 Chronic obstructive pulmonary disease with (acute) exacerbation: Secondary | ICD-10-CM

## 2013-02-15 DIAGNOSIS — J189 Pneumonia, unspecified organism: Principal | ICD-10-CM

## 2013-02-15 LAB — CBC
HCT: 28 % — ABNORMAL LOW (ref 36.0–46.0)
Hemoglobin: 8.7 g/dL — ABNORMAL LOW (ref 12.0–15.0)
MCV: 78.4 fL (ref 78.0–100.0)
RBC: 3.57 MIL/uL — ABNORMAL LOW (ref 3.87–5.11)
WBC: 14.3 10*3/uL — ABNORMAL HIGH (ref 4.0–10.5)

## 2013-02-15 LAB — BASIC METABOLIC PANEL
CO2: 32 mEq/L (ref 19–32)
Chloride: 93 mEq/L — ABNORMAL LOW (ref 96–112)
GFR calc Af Amer: >90 mL/min (ref >90)
Potassium: 3.7 mEq/L (ref 3.5–5.1)

## 2013-02-15 MED ORDER — AMITRIPTYLINE HCL 25 MG PO TABS
50.0000 mg | ORAL_TABLET | Freq: Every day | ORAL | Status: DC
  Administered 2013-02-15 – 2013-02-16 (×3): 50 mg via ORAL
  Filled 2013-02-15 (×3): qty 2

## 2013-02-15 MED ORDER — BUSPIRONE HCL 5 MG PO TABS: 10.0000 mg | ORAL_TABLET | Freq: Three times a day (TID) | ORAL | Status: DC | PRN

## 2013-02-15 MED ORDER — THEOPHYLLINE ER 300 MG PO CP24
300.0000 mg | ORAL_CAPSULE | Freq: Two times a day (BID) | ORAL | Status: DC
  Administered 2013-02-15 – 2013-02-16 (×4): 300 mg via ORAL
  Filled 2013-02-15 (×12): qty 1

## 2013-02-15 MED ORDER — DEXTROSE 5 % IV SOLN
1.0000 g | Freq: Two times a day (BID) | INTRAVENOUS | Status: DC
  Administered 2013-02-15 – 2013-02-16 (×4): 1 g via INTRAVENOUS
  Filled 2013-02-15 (×5): qty 1

## 2013-02-15 MED ORDER — VANCOMYCIN HCL IN DEXTROSE 1-5 GM/200ML-% IV SOLN
1000.0000 mg | INTRAVENOUS | Status: DC
  Administered 2013-02-15 – 2013-02-17 (×3): 1000 mg via INTRAVENOUS
  Filled 2013-02-15 (×3): qty 200

## 2013-02-15 MED ORDER — ARFORMOTEROL TARTRATE 15 MCG/2ML IN NEBU
15.0000 ug | INHALATION_SOLUTION | Freq: Two times a day (BID) | RESPIRATORY_TRACT | Status: DC
  Administered 2013-02-15 – 2013-02-17 (×5): 15 ug via RESPIRATORY_TRACT
  Filled 2013-02-15 (×10): qty 2

## 2013-02-15 MED ORDER — VENLAFAXINE HCL ER 75 MG PO CP24
300.0000 mg | ORAL_CAPSULE | Freq: Every day | ORAL | Status: DC
  Administered 2013-02-15 – 2013-02-17 (×3): 300 mg via ORAL
  Filled 2013-02-15 (×3): qty 4

## 2013-02-15 MED ORDER — SODIUM CHLORIDE 0.9 % IV SOLN
INTRAVENOUS | Status: DC
  Administered 2013-02-15: 08:00:00 via INTRAVENOUS
  Administered 2013-02-15: 1000 mL via INTRAVENOUS

## 2013-02-15 MED ORDER — TIOTROPIUM BROMIDE MONOHYDRATE 18 MCG IN CAPS
18.0000 ug | ORAL_CAPSULE | Freq: Every day | RESPIRATORY_TRACT | Status: DC
  Administered 2013-02-16 – 2013-02-17 (×2): 18 ug via RESPIRATORY_TRACT
  Filled 2013-02-15 (×3): qty 5

## 2013-02-15 MED ORDER — LISINOPRIL 10 MG PO TABS
10.0000 mg | ORAL_TABLET | Freq: Every day | ORAL | Status: DC
  Administered 2013-02-15 – 2013-02-17 (×3): 10 mg via ORAL
  Filled 2013-02-15 (×3): qty 1

## 2013-02-15 MED ORDER — ALBUTEROL SULFATE (5 MG/ML) 0.5% IN NEBU
2.5000 mg | INHALATION_SOLUTION | Freq: Four times a day (QID) | RESPIRATORY_TRACT | Status: DC
  Administered 2013-02-15 – 2013-02-17 (×10): 2.5 mg via RESPIRATORY_TRACT
  Filled 2013-02-15 (×11): qty 0.5

## 2013-02-15 MED ORDER — ACETAMINOPHEN 500 MG PO TABS
1000.0000 mg | ORAL_TABLET | ORAL | Status: DC | PRN
  Administered 2013-02-15 – 2013-02-16 (×3): 1000 mg via ORAL
  Filled 2013-02-15 (×2): qty 1
  Filled 2013-02-15: qty 2
  Filled 2013-02-15: qty 1

## 2013-02-15 MED ORDER — ONDANSETRON HCL 4 MG PO TABS: 4.0000 mg | ORAL_TABLET | Freq: Four times a day (QID) | ORAL | Status: DC | PRN

## 2013-02-15 MED ORDER — ATORVASTATIN CALCIUM 20 MG PO TABS
20.0000 mg | ORAL_TABLET | Freq: Every day | ORAL | Status: DC
  Administered 2013-02-15 – 2013-02-17 (×3): 20 mg via ORAL
  Filled 2013-02-15 (×4): qty 1

## 2013-02-15 MED ORDER — THEOPHYLLINE ER 300 MG PO TB12
ORAL_TABLET | ORAL | Status: AC
  Filled 2013-02-15: qty 1

## 2013-02-15 MED ORDER — SODIUM CHLORIDE 0.9 % IJ SOLN
3.0000 mL | Freq: Two times a day (BID) | INTRAMUSCULAR | Status: DC
  Administered 2013-02-15 (×2): 3 mL via INTRAVENOUS

## 2013-02-15 MED ORDER — ENOXAPARIN SODIUM 40 MG/0.4ML ~~LOC~~ SOLN
40.0000 mg | SUBCUTANEOUS | Status: DC
  Administered 2013-02-15 – 2013-02-17 (×3): 40 mg via SUBCUTANEOUS
  Filled 2013-02-15 (×3): qty 0.4

## 2013-02-15 MED ORDER — LEVOFLOXACIN IN D5W 750 MG/150ML IV SOLN
750.0000 mg | INTRAVENOUS | Status: DC
  Administered 2013-02-15 – 2013-02-16 (×2): 750 mg via INTRAVENOUS
  Filled 2013-02-15 (×3): qty 150

## 2013-02-15 MED ORDER — FLUTICASONE PROPIONATE 50 MCG/ACT NA SUSP
2.0000 | Freq: Every day | NASAL | Status: DC
  Administered 2013-02-15 – 2013-02-17 (×3): 2 via NASAL
  Filled 2013-02-15: qty 16

## 2013-02-15 MED ORDER — ONDANSETRON HCL 4 MG/2ML IJ SOLN: 4.0000 mg | Freq: Four times a day (QID) | INTRAMUSCULAR | Status: DC | PRN

## 2013-02-15 MED ORDER — PANTOPRAZOLE SODIUM 40 MG PO TBEC
80.0000 mg | DELAYED_RELEASE_TABLET | Freq: Every day | ORAL | Status: DC
  Administered 2013-02-15 – 2013-02-17 (×3): 80 mg via ORAL
  Filled 2013-02-15 (×4): qty 1
  Filled 2013-02-15: qty 2

## 2013-02-15 MED ORDER — LEVOTHYROXINE SODIUM 88 MCG PO TABS
88.0000 ug | ORAL_TABLET | Freq: Every day | ORAL | Status: DC
  Administered 2013-02-15 – 2013-02-16 (×2): 88 ug via ORAL
  Filled 2013-02-15 (×6): qty 1

## 2013-02-15 MED ORDER — ENSURE COMPLETE PO LIQD
237.0000 mL | Freq: Two times a day (BID) | ORAL | Status: DC
  Administered 2013-02-16: 237 mL via ORAL

## 2013-02-15 NOTE — Plan of Care (Signed)
Problem: Consults Goal: Nutrition Consult-if indicated Outcome: Completed/Met Date Met:  02/15/13 ordered

## 2013-02-15 NOTE — Progress Notes (Signed)
Pt refused teaching on Tobacco Cessation b/c she is not ready to quit, Flu and PNA vaccines b/c she has already had them.  Educational Handouts given:  Pressure Ulcer Prevention  (d/t Braden score) Respiratory Failure Pneumonia, Adult

## 2013-02-15 NOTE — Progress Notes (Signed)
UR Chart Review Completed  

## 2013-02-15 NOTE — Progress Notes (Signed)
     Subjective: This lady was admitted with pneumonia right-sided. 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 She also has lung cancer. Approximately one month ago, she was entered into hospice care. She is a DO NOT RESUSCITATE also.           Physical Exam: Blood pressure 121/66, pulse 81, temperature 97.9 F (36.6 C), temperature source Oral, resp. rate 30, height 5\' 7"  (1.702 m), weight 52.3 kg (115 lb 4.8 oz), SpO2 100.00%. Surprisingly, she looks systemically well. She is not toxic or septic. Lung fields show bilateral coarse crackles with some wheezing. There is no bronchial breathing. Heart sounds are present in sinus rhythm. There are no murmurs or gallop rhythm. She is alert and orientated.   Investigations:  Recent Results (from the past 240 hour(s))  MRSA PCR SCREENING     Status: None   Collection Time    02/15/13  1:18 AM      Result Value Range Status   MRSA by PCR NEGATIVE  NEGATIVE Final   Comment:            The GeneXpert MRSA Assay (FDA     approved for NASAL specimens     only), is one component of a     comprehensive MRSA colonization     surveillance program. It is not     intended to diagnose MRSA     infection nor to guide or     monitor treatment for     MRSA infections.     Basic Metabolic Panel:  Recent Labs  81/19/14 2151 02/15/13 0452  NA 131* 135  K 3.7 3.7  CL 90* 93*  CO2 33* 32  GLUCOSE 131* 203*  BUN 5* 5*  CREATININE 0.48* 0.50  CALCIUM 9.5 9.4     CBC:  Recent Labs  02/14/13 2151 02/15/13 0452  WBC 12.4* 14.3*  NEUTROABS 11.0*  --   HGB 9.1* 8.7*  HCT 29.3* 28.0*  MCV 77.9* 78.4  PLT 407* 367    Dg Chest Port 1 View  02/14/2013  *RADIOLOGY REPORT*  Clinical Data: Shortness of breath.  History of oxygen dependent COPD and prior history of lung cancer with right upper lobectomy.  PORTABLE CHEST - 1 VIEW 02/14/2013 2133 hours:  Comparison: CT chest 10/30/2012 and portable chest x-ray 10/29/2012,  04/22/2012.  Findings: Cardiac silhouette enlarged but stable.  Pulmonary vascularity normal without evidence of pulmonary edema. Pleuroparenchymal scarring in the right upper chest related to the prior right upper lobectomy.  New patchy airspace opacities in the right lower lobe.  No new pulmonary parenchymal abnormalities elsewhere.  IMPRESSION: Right lower lobe pneumonia.  Pleuroparenchymal scarring related to the prior right upper lobectomy.  Stable cardiomegaly without pulmonary edema.   Original Report Authenticated By: Hulan Saas, M.D.       Medications: I have reviewed the patient's current medications.  Impression: 1. Right-sided health required pneumonia. 2. Acute on chronic respiratory failure, secondary to #1, improving. 3. Hypertension. 4. Pseudocyst, status post in situ drainage. 5. History of lung cancer, apparently stable. Follow at [facility]. 6. Severe protein calorie malnutrition secondary to 1, 4 and 5.      Plan:  1. Continue with current intravenous antibiotics. 2. Move to the regular medical floor.      LOS: 1 day   Wilson Singer Pager [phone]  02/15/2013, 7:56 AM

## 2013-02-15 NOTE — Progress Notes (Signed)
Nutrition Brief Note  Chart reviewed. RD pulled to pt from malnutrition screen. Pt has been Hospice care for approximately a month and a DNR. Hx severe malnutrition. She would like to receive Ensure while she's here. Will order vanilla which is her preference. No further nutrition interventions warranted at this time.  Please re-consult as needed.   Royann Shivers MS,RD,LDN,CSG Office: [phone] Pager: [phone]

## 2013-02-15 NOTE — Progress Notes (Addendum)
ANTIBIOTIC CONSULT NOTE - INITIAL  Pharmacy Consult for Vancomycin and Cefepime Indication: pneumonia  Allergies  Allergen Reactions  . Sulfa Antibiotics Other (See Comments)    HANDS,FEET AND MOUTH BLISTERING   Patient Measurements: Height: 5\' 7"  (170.2 cm) Weight: 115 lb 4.8 oz (52.3 kg) IBW/kg (Calculated) : 61.6  Vital Signs: Temp: 97.9 F (36.6 C) (03/17 0400) Temp src: Oral (03/17 0400) BP: 121/66 mmHg (03/17 0600) Pulse Rate: 81 (03/17 0600) Intake/Output from previous day: 03/16 0701 - 03/17 0700 In: 359.3 [I.V.:359.3] Out: -  Intake/Output from this shift:    Labs:  Recent Labs  02/14/13 2151 02/15/13 0452  WBC 12.4* 14.3*  HGB 9.1* 8.7*  PLT 407* 367  CREATININE 0.48* 0.50   Estimated Creatinine Clearance: 55.6 ml/min (by C-G formula based on Cr of 0.5). No results found for this basename: VANCOTROUGH, Leodis Binet, VANCORANDOM, GENTTROUGH, GENTPEAK, GENTRANDOM, TOBRATROUGH, TOBRAPEAK, TOBRARND, AMIKACINPEAK, AMIKACINTROU, AMIKACIN,  in the last 72 hours   Microbiology: Recent Results (from the past 720 hour(s))  MRSA PCR SCREENING     Status: None   Collection Time    02/15/13  1:18 AM      Result Value Range Status   MRSA by PCR NEGATIVE  NEGATIVE Final   Comment:            The GeneXpert MRSA Assay (FDA     approved for NASAL specimens     only), is one component of a     comprehensive MRSA colonization     surveillance program. It is not     intended to diagnose MRSA     infection nor to guide or     monitor treatment for     MRSA infections.   Medical History: Past Medical History  Diagnosis Date  . Aneurysm     brain  . GERD (gastroesophageal reflux disease)   . Hypertension   . Anxiety   . Oxygen dependent     requires oxygen for at least 16 hours per day,   . Hypothyroidism   . COPD (chronic obstructive pulmonary disease)     on home oxygen  . Anemia     iron deficiency anemia  . Gastric erosions     causing anemia  . Chronic  respiratory failure   . Anemia   . Gastric ulcer   . Borderline diabetes   . Cancer   . Lung cancer   . 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 H/O cerebral aneurysm repair 11/08/2012   Medications:  Scheduled:  . albuterol  2.5 mg Nebulization Q6H  . amitriptyline  50 mg Oral QHS  . arformoterol  15 mcg Nebulization BID  . atorvastatin  20 mg Oral Daily  . enoxaparin (LOVENOX) injection  40 mg Subcutaneous Q24H  . [COMPLETED] ipratropium  0.5 mg Nebulization Once  . [COMPLETED] levofloxacin (LEVAQUIN) IV  750 mg Intravenous Once  . levofloxacin (LEVAQUIN) IV  750 mg Intravenous Q24H  . levothyroxine  88 mcg Oral QAC breakfast  . lisinopril  10 mg Oral Daily  . [COMPLETED] methylPREDNISolone (SOLU-MEDROL) injection  125 mg Intravenous Once  . pantoprazole  80 mg Oral Daily  . [COMPLETED] sodium chloride  500 mL Intravenous Once  . sodium chloride  3 mL Intravenous Q12H  . theophylline  300 mg Oral BID  . tiotropium  18 mcg Inhalation Daily  . vancomycin  1,000 mg Intravenous Q24H  . venlafaxine XR  300 mg Oral Q breakfast  . [DISCONTINUED] ceFEPIme (MAXIPIME)  2 GM IVP  2 g Intravenous Once  . [DISCONTINUED] vancomycin  1,000 mg Intravenous Once   Assessment: 67yo female admitted with right sided pna.  Pt also has lung cancer and is DNR.  SCr is OK.  Pt has low BMI.  Goal of Therapy:  Vancomycin trough level 15-20 mcg/ml  Plan: Vancomycin 1gm IV q24hrs Check trough at steady state Cefepime 1gm IV q12hrs Monitor labs, renal fxn, and cultures per protocol Duration of therapy per MD (anticipate 8 days)  Valrie Hart A 02/15/2013,8:05 AM

## 2013-02-15 NOTE — Progress Notes (Signed)
Inpatient Diabetes Program Recommendations  AACE/ADA: New Consensus Statement on Inpatient Glycemic Control (2013)  Target Ranges:  Prepandial:   less than 140 mg/dL      Peak postprandial:   less than 180 mg/dL (1-2 hours)      Critically ill patients:  140 - 180 mg/dL   Results for Tracey Martinez, Tracey Martinez (MRN [id number]) as of 02/15/2013 08:45  Ref. Range 02/14/2013 21:51 02/15/2013 04:52  Glucose Latest Range: 70-99 mg/dL 469 (H) 629 (H)    Inpatient Diabetes Program Recommendations Correction (SSI): May want to consider ordering CBGs ACHS with Novolog correction if needed. HgbA1C: Please consider ordering an A1C to determine glycemic control over the past 2-3 months.  Last A1C in the chart was 6.6% on 10/29/2012. Diet: May want to consider changing diet to carbohydrate modified diabetic diet.  Note: According to the patient's chart, she has a history of borderline diabetes and her last A1C was 6.6% on 10/29/2012.  Fasting lab glucose this morning was 203 mg/dl.  If appropriate, may want to consider ordering CBGs ACHS with Novolog correction if needed, ordering an A1C, and changing diet to carbohydrate modified diabetic diet.  Will continue to follow.  Thanks, Orlando Penner, RN, BSN, CCRN Diabetes Coordinator Inpatient Diabetes Program [phone]

## 2013-02-15 NOTE — Progress Notes (Signed)
Patient transferred to Dept. 300 via wheelchair.  

## 2013-02-16 DIAGNOSIS — J449 Chronic obstructive pulmonary disease, unspecified: Secondary | ICD-10-CM

## 2013-02-16 LAB — COMPREHENSIVE METABOLIC PANEL
ALT: 9 U/L (ref 0–35)
AST: 13 U/L (ref 0–37)
Albumin: 2.1 g/dL — ABNORMAL LOW (ref 3.5–5.2)
CO2: 31 mEq/L (ref 19–32)
Calcium: 9.2 mg/dL (ref 8.4–10.5)
GFR calc non Af Amer: >90 mL/min (ref >90)
Sodium: 137 mEq/L (ref 135–145)

## 2013-02-16 LAB — CBC
MCH: 24.2 pg — ABNORMAL LOW (ref 26.0–34.0)
Platelets: 342 10*3/uL (ref 150–400)
RBC: 3.26 MIL/uL — ABNORMAL LOW (ref 3.87–5.11)
WBC: 10.1 10*3/uL (ref 4.0–10.5)

## 2013-02-16 LAB — STREP PNEUMONIAE URINARY ANTIGEN: Strep Pneumo Urinary Antigen: POSITIVE — AB

## 2013-02-16 MED ORDER — SODIUM CHLORIDE 0.9 % IV SOLN: 250.0000 mL | INTRAVENOUS | Status: DC | PRN

## 2013-02-16 MED ORDER — SODIUM CHLORIDE 0.9 % IJ SOLN: 3.0000 mL | INTRAMUSCULAR | Status: DC | PRN

## 2013-02-16 MED ORDER — POTASSIUM CHLORIDE CRYS ER 20 MEQ PO TBCR
40.0000 meq | EXTENDED_RELEASE_TABLET | Freq: Once | ORAL | Status: AC
  Administered 2013-02-16: 40 meq via ORAL
  Filled 2013-02-16: qty 2

## 2013-02-16 MED ORDER — SODIUM CHLORIDE 0.9 % IJ SOLN
3.0000 mL | Freq: Two times a day (BID) | INTRAMUSCULAR | Status: DC
  Administered 2013-02-16 – 2013-02-17 (×3): 3 mL via INTRAVENOUS

## 2013-02-16 MED ORDER — THEOPHYLLINE ER 300 MG PO CP24
ORAL_CAPSULE | ORAL | Status: AC
  Filled 2013-02-16: qty 1

## 2013-02-16 NOTE — Care Management Note (Signed)
    Page 1 of 1   02/17/2013     3:10:56 PM   CARE MANAGEMENT NOTE 02/17/2013  Patient:  LILLYEN, SCHOW   Account Number:  [id number]  Date Initiated:  02/16/2013  Documentation initiated by:  Rosemary Holms  Subjective/Objective Assessment:   Pt admitted from home where she lives with her spouse. [facility] sees pt in the home. No additional HH or DME anticipated.     Action/Plan:   Anticipated DC Date:  02/17/2013   Anticipated DC Plan:  HOME W HOSPICE CARE      DC Planning Services  CM consult      Choice offered to / List presented to:             Status of service:  Completed, signed off Medicare Important Message given?  YES (If response is "NO", the following Medicare IM given date fields will be blank) Date Medicare IM given:  02/16/2013 Date Additional Medicare IM given:    Discharge Disposition:  HOME W HOSPICE CARE  Per UR Regulation:    If discussed at Long Length of Stay Meetings, dates discussed:    Comments:  02/17/13 Rosemary Holms RN BSN CM Diannia Ruder at [facility] alerted of DC. Summary sent per request.  02/16/13 Rosemary Holms RN BNS CM Renea Ee, [phone], from [facility] called. CM to call and notify her of DC so services can be resumed. Pt notified of above. Husband signed IM at Pt's request.

## 2013-02-16 NOTE — Progress Notes (Signed)
Subjective: This lady was admitted with pneumonia right-sided. 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 She also has lung cancer. Approximately one month ago, she was entered into hospice care. She is a DO NOT RESUSCITATE also. Today she feels better than yesterday but seems to get dyspneic on exertion although she does not walk too much.           Physical Exam: Blood pressure 144/75, pulse 104, temperature 97.3 F (36.3 C), temperature source Oral, resp. rate 20, height 5\' 7"  (1.702 m), weight 52.3 kg (115 lb 4.8 oz), SpO2 100.00%. Surprisingly, she looks systemically well. She is not toxic or septic. Lung fields show bilateral coarse crackles with some wheezing. There is no bronchial breathing. Heart sounds are present in sinus rhythm. There are no murmurs or gallop rhythm. She is alert and orientated.   Investigations:  Recent Results (from the past 240 hour(s))  MRSA PCR SCREENING     Status: None   Collection Time    02/15/13  1:18 AM      Result Value Range Status   MRSA by PCR NEGATIVE  NEGATIVE Final   Comment:            The GeneXpert MRSA Assay (FDA     approved for NASAL specimens     only), is one component of a     comprehensive MRSA colonization     surveillance program. It is not     intended to diagnose MRSA     infection nor to guide or     monitor treatment for     MRSA infections.  CULTURE, EXPECTORATED SPUTUM-ASSESSMENT     Status: None   Collection Time    02/15/13  7:37 PM      Result Value Range Status   Specimen Description SPUTUM   Final   Special Requests Normal   Final   Sputum evaluation     Final   Value: THIS SPECIMEN IS ACCEPTABLE. RESPIRATORY CULTURE REPORT TO FOLLOW.   Report Status PENDING   Incomplete     Basic Metabolic Panel:  Recent Labs  96/04/54 0452 02/16/13 0444  NA 135 137  K 3.7 3.1*  CL 93* 97  CO2 32 31  GLUCOSE 203* 150*  BUN 5* 8  CREATININE 0.50 0.55  CALCIUM 9.4 9.2      CBC:  Recent Labs  02/14/13 2151 02/15/13 0452 02/16/13 0444  WBC 12.4* 14.3* 10.1  NEUTROABS 11.0*  --   --   HGB 9.1* 8.7* 7.9*  HCT 29.3* 28.0* 25.3*  MCV 77.9* 78.4 77.6*  PLT 407* 367 342    Dg Chest Port 1 View  02/14/2013  *RADIOLOGY REPORT*  Clinical Data: Shortness of breath.  History of oxygen dependent COPD and prior history of lung cancer with right upper lobectomy.  PORTABLE CHEST - 1 VIEW 02/14/2013 2133 hours:  Comparison: CT chest 10/30/2012 and portable chest x-ray 10/29/2012, 04/22/2012.  Findings: Cardiac silhouette enlarged but stable.  Pulmonary vascularity normal without evidence of pulmonary edema. Pleuroparenchymal scarring in the right upper chest related to the prior right upper lobectomy.  New patchy airspace opacities in the right lower lobe.  No new pulmonary parenchymal abnormalities elsewhere.  IMPRESSION: Right lower lobe pneumonia.  Pleuroparenchymal scarring related to the prior right upper lobectomy.  Stable cardiomegaly without pulmonary edema.   Original Report Authenticated By: Hulan Saas, M.D.       Medications: I have reviewed the patient's  current medications.  Impression: 1. Right-sided health required pneumonia, clinically improving. 2. Acute on chronic respiratory failure, secondary to #1, improving. 3. Hypertension. 4. Pseudocyst, status post in situ drainage. 5. History of lung cancer, apparently stable. Follow at [facility]. 6. Severe protein calorie malnutrition secondary to 1, 4 and 5.      Plan:  1. Continue with current intravenous antibiotics. 2. Discontinue IV fluids, encourage oral intake. 3. Mobilize. 4. Possible discharge home tomorrow.     LOS: 2 days   Wilson Singer Pager [phone]  02/16/2013, 10:48 AM

## 2013-02-17 DIAGNOSIS — D649 Anemia, unspecified: Secondary | ICD-10-CM

## 2013-02-17 DIAGNOSIS — J9620 Acute and chronic respiratory failure, unspecified whether with hypoxia or hypercapnia: Secondary | ICD-10-CM

## 2013-02-17 LAB — LEGIONELLA ANTIGEN, URINE: Legionella Antigen, Urine: NEGATIVE

## 2013-02-17 LAB — BASIC METABOLIC PANEL
GFR calc Af Amer: >90 mL/min (ref >90)
GFR calc non Af Amer: >90 mL/min (ref >90)
Glucose, Bld: 127 mg/dL — ABNORMAL HIGH (ref 70–99)
Potassium: 3.6 mEq/L (ref 3.5–5.1)
Sodium: 137 mEq/L (ref 135–145)

## 2013-02-17 MED ORDER — LEVOFLOXACIN 750 MG PO TABS: 750.0000 mg | ORAL_TABLET | Freq: Every day | ORAL | Status: AC

## 2013-02-17 NOTE — Discharge Summary (Signed)
Physician Discharge Summary  Tracey Martinez:[id number] DOB: March 31, 1946 DOA: 02/14/2013  PCP: Ninfa Linden, FNP  Admit date: 02/14/2013 Discharge date: 02/17/2013  Time spent: Greater than 30 minutes  Recommendations for Outpatient Follow-up:  1. Follow with primary care physician in the next week or 2. 2. Will need repeat chest x-ray in 4-6 weeks.   Discharge Diagnoses:  1. Healthcare associated pneumonia right side, strep pneumonia urine antigen positive. Improving. 2. Hypertension. 3. History of lung cancer, stable. 4. Acute on chronic respiratory failure, secondary to #1. 5. Pseudocyst, pancreatic, status post in situ drainage tube. And 6. Severe protein calorie malnutrition secondary to #1, #3 and #5.   Discharge Condition: Stable and improving.  Diet recommendation: Regular.  Filed Weights   02/14/13 2114 02/15/13 0115  Weight: 52.164 kg (115 lb) 52.3 kg (115 lb 4.8 oz)    History of present illness:  This very pleasant 67 year old lady presents to the hospital with symptoms of dyspnea. Please see initial history as outlined below: HPI: Tracey Martinez is a 67 y.o. female with past medical history most significant for severe COPD, chronic respiratory failure requiring home oxygen and lung cancer wall has been followed at [facility] with serial scans was brought in by ambulance to the Emergency Department complaining of 4 days of gradual onset, gradually worsening, constant SOB with associated cough productive of yellow sputum. She denies fevers. She is on 3L O2 at home and does albuterol and spiriva breathing treatments daily. She reports using both her oxygen and her breathing treatments with no improvement. She reports that she has been eating and drinking normally since the onset. She states that she called EMS due to shortness of breath and associated anxiety attack which she has a history of. She had a JP drainage tube placed in December 2013 from a pseudocyst in her pancreas  and denies any changes. She is currently being followed by Dr. Joselyn Glassman at [facility]. She denies emesis, diarrhea and known fevers as associated symptoms. She is a current everyday smoker but denies alcohol use.  Respiratory rate is about 34, she is tachycardic, elevated white cell counts and was found to have a fever. Patient's chest x-ray was also consisting of right lower lobe pneumonia in ER physician asked me to admit the patient for above.  Hospital Course:  The patient was admitted and started on triple intravenous antibiotics for healthcare associated pneumonia. She does have a history of severe COPD, oxygen dependent at 2-3 L of oxygen per minute and so has chronic respiratory failure. In addition she has history of lung cancer, which is apparently stable. Her chest x-ray on this admission showed a clear new pneumonia in the right lower lobe. She improved rather quickly and feels that her breathing is back to her baseline. She is now requiring approximately 3 L oxygen per minute and has mobilize yesterday. She is keen to go home. She has no fever. Her cough is largely nonproductive at this point. She has good by mouth intake. She stable for discharge. She will need to have repeat chest x-ray in 4-6 weeks' time to show clearing of the pneumonia.  Procedures:  None.   Consultations:  None.  Discharge Exam: Filed Vitals:   02/16/13 2100 02/17/13 0138 02/17/13 0500 02/17/13 0746  BP: 156/84  154/78   Pulse: 100  90   Temp: 98.1 F (36.7 C)  98.3 F (36.8 C)   TempSrc: Oral  Oral   Resp: 20  15   Height:  Weight:      SpO2: 99% 96% 98% 98%    General: She looks chronically sick. Cachectic. However she is not toxic or septic. Cardiovascular: Heart sounds are present without murmurs or added sounds. Somewhat of a tachycardia. Respiratory: Lung fields show some coarse crackles mostly in the right mid and lower zones. There is no bronchial breathing or wheezing now. She is alert and  orientated.  Discharge Instructions  Discharge Orders   Future Orders Complete By Expires     Diet - low sodium heart healthy  As directed     Increase activity slowly  As directed         Medication List    TAKE these medications       acetaminophen 500 MG tablet  Commonly known as:  TYLENOL  Take 1,000 mg by mouth every 4 (four) hours as needed. For pain     albuterol (2.5 MG/3ML) 0.083% nebulizer solution  Commonly known as:  PROVENTIL  Take 2.5 mg by nebulization 2 (two) times daily.     albuterol 108 (90 BASE) MCG/ACT inhaler  Commonly known as:  PROVENTIL HFA;VENTOLIN HFA  Inhale 2 puffs into the lungs 2 (two) times daily as needed.     amitriptyline 25 MG tablet  Commonly known as:  ELAVIL  Take 50 mg by mouth at bedtime.     arformoterol 15 MCG/2ML Nebu  Commonly known as:  BROVANA  Take 15 mcg by nebulization 2 (two) times daily.     atorvastatin 20 MG tablet  Commonly known as:  LIPITOR  Take 20 mg by mouth daily.     busPIRone 10 MG tablet  Commonly known as:  BUSPAR  Take 10 mg by mouth 3 (three) times daily as needed. For anxiety     CALCIUM-MAGNESIUM-ZINC PO  Take 1 tablet by mouth every morning.     cholecalciferol 1000 UNITS tablet  Commonly known as:  VITAMIN D  Take 1,000 Units by mouth every morning.     fluticasone 50 MCG/ACT nasal spray  Commonly known as:  FLONASE  Place 2 sprays into the nose every morning.     levofloxacin 750 MG tablet  Commonly known as:  LEVAQUIN  Take 1 tablet (750 mg total) by mouth daily.     levothyroxine 100 MCG tablet  Commonly known as:  SYNTHROID, LEVOTHROID  Take 100 mcg by mouth daily.     lisinopril 20 MG tablet  Commonly known as:  PRINIVIL,ZESTRIL  Take 10 mg by mouth every morning.     omeprazole 40 MG capsule  Commonly known as:  PRILOSEC  Take 1 capsule (40 mg total) by mouth 2 (two) times daily.     theophylline 300 MG 24 hr capsule  Commonly known as:  THEO-24  Take 300 mg by mouth 2  (two) times daily.     tiotropium 18 MCG inhalation capsule  Commonly known as:  SPIRIVA  Place 18 mcg into inhaler and inhale daily.     venlafaxine XR 150 MG 24 hr capsule  Commonly known as:  EFFEXOR-XR  Take 300 mg by mouth daily with breakfast.          The results of significant diagnostics from this hospitalization (including imaging, microbiology, ancillary and laboratory) are listed below for reference.    Significant Diagnostic Studies: Dg Chest Port 1 View  02/14/2013  *RADIOLOGY REPORT*  Clinical Data: Shortness of breath.  History of oxygen dependent COPD and prior history of lung cancer with  right upper lobectomy.  PORTABLE CHEST - 1 VIEW 02/14/2013 2133 hours:  Comparison: CT chest 10/30/2012 and portable chest x-ray 10/29/2012, 04/22/2012.  Findings: Cardiac silhouette enlarged but stable.  Pulmonary vascularity normal without evidence of pulmonary edema. Pleuroparenchymal scarring in the right upper chest related to the prior right upper lobectomy.  New patchy airspace opacities in the right lower lobe.  No new pulmonary parenchymal abnormalities elsewhere.  IMPRESSION: Right lower lobe pneumonia.  Pleuroparenchymal scarring related to the prior right upper lobectomy.  Stable cardiomegaly without pulmonary edema.   Original Report Authenticated By: Hulan Saas, M.D.     Microbiology: Recent Results (from the past 240 hour(s))  MRSA PCR SCREENING     Status: None   Collection Time    02/15/13  1:18 AM      Result Value Range Status   MRSA by PCR NEGATIVE  NEGATIVE Final   Comment:            The GeneXpert MRSA Assay (FDA     approved for NASAL specimens     only), is one component of a     comprehensive MRSA colonization     surveillance program. It is not     intended to diagnose MRSA     infection nor to guide or     monitor treatment for     MRSA infections.  CULTURE, EXPECTORATED SPUTUM-ASSESSMENT     Status: None   Collection Time    02/15/13  7:37 PM       Result Value Range Status   Specimen Description SPUTUM   Final   Special Requests Normal   Final   Sputum evaluation     Final   Value: THIS SPECIMEN IS ACCEPTABLE. RESPIRATORY CULTURE REPORT TO FOLLOW.   Report Status PENDING   Incomplete  CULTURE, RESPIRATORY (NON-EXPECTORATED)     Status: None   Collection Time    02/15/13  7:37 PM      Result Value Range Status   Specimen Description SPUTUM   Final   Special Requests NONE   Final   Gram Stain PENDING   Incomplete   Culture NORMAL OROPHARYNGEAL FLORA   Final   Report Status PENDING   Incomplete     Labs: Basic Metabolic Panel:  Recent Labs Lab 02/14/13 2151 02/15/13 0452 02/16/13 0444 02/17/13 0515  NA 131* 135 137 137  K 3.7 3.7 3.1* 3.6  CL 90* 93* 97 97  CO2 33* 32 31 35*  GLUCOSE 131* 203* 150* 127*  BUN 5* 5* 8 5*  CREATININE 0.48* 0.50 0.55 0.54  CALCIUM 9.5 9.4 9.2 9.5   Liver Function Tests:  Recent Labs Lab 02/16/13 0444  AST 13  ALT 9  ALKPHOS 101  BILITOT 0.1*  PROT 6.6  ALBUMIN 2.1*     CBC:  Recent Labs Lab 02/14/13 2151 02/15/13 0452 02/16/13 0444  WBC 12.4* 14.3* 10.1  NEUTROABS 11.0*  --   --   HGB 9.1* 8.7* 7.9*  HCT 29.3* 28.0* 25.3*  MCV 77.9* 78.4 77.6*  PLT 407* 367 342         Signed:  Verley Pariseau C  [facility] 02/17/2013, 8:46 AM

## 2013-02-17 NOTE — Progress Notes (Signed)
Pt and her husband verbalize understanding of d/c instructions, medications and follow up appts. No questions at this time. IV d/c. I escorted pt out via wheelchair, her husband is driving her home. Sheryn Bison

## 2013-02-18 LAB — CULTURE, RESPIRATORY W GRAM STAIN: Culture: NORMAL

## 2013-02-23 LAB — EXPECTORATED SPUTUM ASSESSMENT W GRAM STAIN, RFLX TO RESP C: Special Requests: NORMAL

## 2013-06-20 ENCOUNTER — Encounter (HOSPITAL_COMMUNITY): Payer: Self-pay

## 2013-06-20 ENCOUNTER — Emergency Department (HOSPITAL_COMMUNITY)
Admission: EM | Admit: 2013-06-20 | Discharge: 2013-06-20 | Disposition: A | Discharge status: Home / Self Care | Payer: Medicare Other | Attending: Emergency Medicine | Admitting: Emergency Medicine

## 2013-06-20 ENCOUNTER — Emergency Department (HOSPITAL_COMMUNITY): Payer: Medicare Other

## 2013-06-20 DIAGNOSIS — N3090 Cystitis, unspecified without hematuria: Secondary | ICD-10-CM | POA: Insufficient documentation

## 2013-06-20 DIAGNOSIS — R7309 Other abnormal glucose: Secondary | ICD-10-CM | POA: Insufficient documentation

## 2013-06-20 DIAGNOSIS — I10 Essential (primary) hypertension: Secondary | ICD-10-CM | POA: Insufficient documentation

## 2013-06-20 DIAGNOSIS — F411 Generalized anxiety disorder: Secondary | ICD-10-CM | POA: Insufficient documentation

## 2013-06-20 DIAGNOSIS — F17200 Nicotine dependence, unspecified, uncomplicated: Secondary | ICD-10-CM | POA: Insufficient documentation

## 2013-06-20 DIAGNOSIS — Z9981 Dependence on supplemental oxygen: Secondary | ICD-10-CM | POA: Insufficient documentation

## 2013-06-20 DIAGNOSIS — Z862 Personal history of diseases of the blood and blood-forming organs and certain disorders involving the immune mechanism: Secondary | ICD-10-CM | POA: Insufficient documentation

## 2013-06-20 DIAGNOSIS — Z85118 Personal history of other malignant neoplasm of bronchus and lung: Secondary | ICD-10-CM | POA: Insufficient documentation

## 2013-06-20 DIAGNOSIS — R109 Unspecified abdominal pain: Secondary | ICD-10-CM | POA: Insufficient documentation

## 2013-06-20 DIAGNOSIS — E039 Hypothyroidism, unspecified: Secondary | ICD-10-CM | POA: Insufficient documentation

## 2013-06-20 DIAGNOSIS — IMO0002 Reserved for concepts with insufficient information to code with codable children: Secondary | ICD-10-CM | POA: Insufficient documentation

## 2013-06-20 DIAGNOSIS — Z8719 Personal history of other diseases of the digestive system: Secondary | ICD-10-CM | POA: Insufficient documentation

## 2013-06-20 DIAGNOSIS — Z982 Presence of cerebrospinal fluid drainage device: Secondary | ICD-10-CM | POA: Insufficient documentation

## 2013-06-20 DIAGNOSIS — K219 Gastro-esophageal reflux disease without esophagitis: Secondary | ICD-10-CM | POA: Insufficient documentation

## 2013-06-20 DIAGNOSIS — J449 Chronic obstructive pulmonary disease, unspecified: Secondary | ICD-10-CM | POA: Insufficient documentation

## 2013-06-20 DIAGNOSIS — Z8709 Personal history of other diseases of the respiratory system: Secondary | ICD-10-CM | POA: Insufficient documentation

## 2013-06-20 DIAGNOSIS — Z8679 Personal history of other diseases of the circulatory system: Secondary | ICD-10-CM | POA: Insufficient documentation

## 2013-06-20 DIAGNOSIS — Z9889 Other specified postprocedural states: Secondary | ICD-10-CM | POA: Insufficient documentation

## 2013-06-20 DIAGNOSIS — J4489 Other specified chronic obstructive pulmonary disease: Secondary | ICD-10-CM | POA: Insufficient documentation

## 2013-06-20 DIAGNOSIS — Z79899 Other long term (current) drug therapy: Secondary | ICD-10-CM | POA: Insufficient documentation

## 2013-06-20 LAB — URINALYSIS, ROUTINE W REFLEX MICROSCOPIC
Nitrite: NEGATIVE
Protein, ur: >300 mg/dL — AB
Specific Gravity, Urine: 1.02 (ref 1.005–1.030)
Urobilinogen, UA: 0.2 mg/dL (ref 0.0–1.0)

## 2013-06-20 LAB — URINE MICROSCOPIC-ADD ON

## 2013-06-20 MED ORDER — CEPHALEXIN 500 MG PO CAPS: 500.0000 mg | ORAL_CAPSULE | Freq: Four times a day (QID) | ORAL | Status: DC

## 2013-06-20 MED ORDER — CEFTRIAXONE SODIUM 1 G IJ SOLR
1.0000 g | Freq: Once | INTRAMUSCULAR | Status: AC
  Administered 2013-06-20: 1 g via INTRAMUSCULAR
  Filled 2013-06-20: qty 10

## 2013-06-20 MED ORDER — LIDOCAINE HCL (PF) 1 % IJ SOLN
INTRAMUSCULAR | Status: AC
  Filled 2013-06-20: qty 5

## 2013-06-20 MED ORDER — LIDOCAINE HCL (PF) 1 % IJ SOLN
INTRAMUSCULAR | Status: AC
  Administered 2013-06-20: 2.1 mL
  Filled 2013-06-20: qty 5

## 2013-06-20 NOTE — ED Notes (Signed)
Pt states she thinks she has a bladder infection.

## 2013-06-20 NOTE — ED Provider Notes (Signed)
History    CSN: [id number] Arrival date & time 06/20/13  1228  First MD Initiated Contact with Patient 06/20/13 1335     Chief Complaint  Patient presents with  . Urinary Tract Infection   HPI Pt was seen at 1345.  Per pt, c/o gradual onset and persistence of constant dysuria, urinary frequency and suprapubic abd "pain" with urination for the past 1 day. Describes her symptoms as "I think I have a urine infection." Denies N/V/D, no flank/back pain, no CP/SOB, no vaginal bleeding/discharge, no hematuria, no fevers.     Past Medical History  Diagnosis Date  . Aneurysm     brain  . GERD (gastroesophageal reflux disease)   . Hypertension   . Anxiety   . Oxygen dependent     requires oxygen for at least 16 hours per day,   . Hypothyroidism   . COPD (chronic obstructive pulmonary disease)     on home oxygen  . Anemia     iron deficiency anemia  . Gastric erosions     causing anemia  . Chronic respiratory failure   . Anemia   . Gastric ulcer   . Borderline diabetes   . Cancer   . Lung cancer   . 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 H/O cerebral aneurysm repair 11/08/2012   Past Surgical History  Procedure Laterality Date  . Lung removal, partial    . Abdominal hysterectomy    . Esophagogastroduodenoscopy  05/11/2012    Procedure: ESOPHAGOGASTRODUODENOSCOPY (EGD);  Surgeon: Malissa Hippo, MD;  Location: [facility];  Service: Endoscopy;  Laterality: N/A;  . Cataract extraction, bilateral     Family History  Problem Relation Age of Onset  . Hypertension Mother   . Hypertension Father   . Diabetes Mother   . Cancer Brother   . Diabetes Brother   . Diabetes Brother    History  Substance Use Topics  . Smoking status: Current Every Day Smoker -- 0.25 packs/day for 55 years  . Smokeless tobacco: Never Used  . Alcohol Use: No    Review of Systems ROS: Statement: All systems negative except as marked or noted in the HPI; Constitutional: Negative for  fever and chills. ; ; Eyes: Negative for eye pain, redness and discharge. ; ; ENMT: Negative for ear pain, hoarseness, nasal congestion, sinus pressure and sore throat. ; ; Cardiovascular: Negative for chest pain, palpitations, diaphoresis, dyspnea and peripheral edema. ; ; Respiratory: Negative for cough, wheezing and stridor. ; ; Gastrointestinal: Negative for nausea, vomiting, diarrhea, blood in stool, hematemesis, jaundice and rectal bleeding. . ; ; Genitourinary: +dysuria, urinary frequency, lower abd pain. Negative for flank pain and hematuria. ; ; Musculoskeletal: Negative for back pain and neck pain. Negative for swelling and trauma.; ; Skin: Negative for pruritus, rash, abrasions, blisters, bruising and skin lesion.; ; Neuro: Negative for headache, lightheadedness and neck stiffness. Negative for weakness, altered level of consciousness , altered mental status, extremity weakness, paresthesias, involuntary movement, seizure and syncope.       Allergies  Sulfa antibiotics  Home Medications   Current Outpatient Rx  Name  Route  Sig  Dispense  Refill  . acetaminophen (TYLENOL) 500 MG tablet   Oral   Take 1,000 mg by mouth every 4 (four) hours as needed. For pain         . albuterol (PROVENTIL) (2.5 MG/3ML) 0.083% nebulizer solution   Nebulization   Take 2.5 mg by nebulization 2 (two) times daily.          Marland Kitchen  amitriptyline (ELAVIL) 25 MG tablet   Oral   Take 50 mg by mouth at bedtime.         Marland Kitchen arformoterol (BROVANA) 15 MCG/2ML NEBU   Nebulization   Take 15 mcg by nebulization 2 (two) times daily.         Marland Kitchen atorvastatin (LIPITOR) 20 MG tablet   Oral   Take 20 mg by mouth at bedtime.          . busPIRone (BUSPAR) 10 MG tablet   Oral   Take 10 mg by mouth 3 (three) times daily as needed. For anxiety         . CALCIUM-MAGNESIUM-ZINC PO   Oral   Take 1 tablet by mouth every morning.          . cholecalciferol (VITAMIN D) 1000 UNITS tablet   Oral   Take 1,000  Units by mouth every morning.          . fluticasone (FLONASE) 50 MCG/ACT nasal spray   Nasal   Place 2 sprays into the nose every morning.         Marland Kitchen levothyroxine (SYNTHROID, LEVOTHROID) 100 MCG tablet   Oral   Take 100 mcg by mouth daily.         Marland Kitchen lisinopril (PRINIVIL,ZESTRIL) 20 MG tablet   Oral   Take 10 mg by mouth every morning.         Marland Kitchen omeprazole (PRILOSEC) 40 MG capsule   Oral   Take 1 capsule (40 mg total) by mouth 2 (two) times daily.   60 capsule   2   . theophylline (THEO-24) 300 MG 24 hr capsule   Oral   Take 300 mg by mouth 2 (two) times daily.         Marland Kitchen tiotropium (SPIRIVA) 18 MCG inhalation capsule   Inhalation   Place 18 mcg into inhaler and inhale daily.         Marland Kitchen venlafaxine XR (EFFEXOR-XR) 150 MG 24 hr capsule   Oral   Take 150 mg by mouth daily with breakfast.          . albuterol (PROVENTIL HFA;VENTOLIN HFA) 108 (90 BASE) MCG/ACT inhaler   Inhalation   Inhale 2 puffs into the lungs 2 (two) times daily as needed.          BP 119/74  Temp(Src) 98 F (36.7 C) (Oral)  Resp 22  Ht 5\' 6"  (1.676 m)  Wt 115 lb (52.164 kg)  BMI 18.57 kg/m2  SpO2 100% Physical Exam 1350: Physical examination:  Nursing notes reviewed; Vital signs and O2 SAT reviewed;  Constitutional: Well developed, Well nourished, Well hydrated, In no acute distress; Head:  Normocephalic, atraumatic; Eyes: EOMI, PERRL, No scleral icterus; ENMT: Mouth and pharynx normal, Mucous membranes moist; Neck: Supple, Full range of motion, No lymphadenopathy; Cardiovascular: Regular rate and rhythm, No gallop; Respiratory: Breath sounds clear & equal bilaterally, No wheezes.  Speaking full sentences with ease, Normal respiratory effort/excursion; Chest: Nontender, Movement normal; Abdomen: Soft, +mild suprapubic tenderness to palp. No rebound or guarding. Nondistended, Normal bowel sounds; Genitourinary: No CVA tenderness; Extremities: Pulses normal, No tenderness, No edema, No calf  edema or asymmetry.; Neuro: AA&Ox3, Major CN grossly intact.  Speech clear. No gross focal motor or sensory deficits in extremities.; Skin: Color normal, Warm, Dry.   ED Course  Procedures    MDM  MDM Reviewed: previous chart, nursing note and vitals Interpretation: labs and CT scan   Results for orders placed  during the hospital encounter of 06/20/13  URINALYSIS, ROUTINE W REFLEX MICROSCOPIC      Result Value Range   Color, Urine YELLOW  YELLOW   APPearance CLEAR  CLEAR   Specific Gravity, Urine 1.020  1.005 - 1.030   pH 6.0  5.0 - 8.0   Glucose, UA NEGATIVE  NEGATIVE mg/dL   Hgb urine dipstick LARGE (*) NEGATIVE   Bilirubin Urine NEGATIVE  NEGATIVE   Ketones, ur NEGATIVE  NEGATIVE mg/dL   Protein, ur >147 (*) NEGATIVE mg/dL   Urobilinogen, UA 0.2  0.0 - 1.0 mg/dL   Nitrite NEGATIVE  NEGATIVE   Leukocytes, UA NEGATIVE  NEGATIVE  URINE MICROSCOPIC-ADD ON      Result Value Range   Squamous Epithelial / LPF FEW (*) RARE   WBC, UA 3-6  <3 WBC/hpf   RBC / HPF 7-10  <3 RBC/hpf   Ct Abdomen Pelvis Wo Contrast 06/20/2013   *RADIOLOGY REPORT*  Clinical Data: Dysuria  CT ABDOMEN AND PELVIS WITHOUT CONTRAST  Technique:  Multidetector CT imaging of the abdomen and pelvis was performed following the standard protocol without intravenous contrast.  Comparison: 11/08/2012  Findings: Patchy opacities along the posterior lung bases, right greater than left (series 3/image 5), possibly reflecting rounded atelectasis or scarring.  Liver is notable for multiple calcified granulomata.  Irregular/complex 5.0 x 3.3 cm fluid collection along the lateral spleen (series 2/image 13), decreased.  Pancreas and adrenal glands are within normal limits.  Gallbladder is notable for small layering calculi (series 2/image 31).  No associate inflammatory changes.  No intrahepatic or extrahepatic ductal dilatation.  Left kidney is notable for a 3.1 x 2.6 cm capsular/perinephric collection (series 2/image 19),  decreased.  Right kidney is unremarkable.  No renal calculi or hydronephrosis.  No evidence of bowel obstruction.  Atherosclerotic calcifications of the abdominal aorta and branch vessels.  No abdominopelvic ascites.  No suspicious abdominopelvic lymphadenopathy.  Status post hysterectomy.  No adnexal masses.  No ureteral or bladder calculi.  Thick-walled bladder (series 2/image 68).  VP shunt catheter terminates in the pelvis.  Mild degenerative changes of the visualized thoracolumbar spine.  IMPRESSION: No renal, ureteral, or bladder calculi.  No hydronephrosis.  Thick-walled bladder, correlate for cystitis.  Evolution of prior large abdominal hematomas with small residual collections along the lateral spleen and left kidney, as described above.  Cholelithiasis, without associated inflammatory changes.   Original Report Authenticated By: Charline Bills, M.D.    [id number]: Pt without obvious UTI on Udip; UC pending. CT scan with thick-walled bladder, questioning cystitis. Pt with symptoms of dysuria and urinary frequency, as well as suprapubic "pain" when she urinates. Will tx for UTI. Dose IM rocephin here before dc. Dx and testing d/w pt and family.  Questions answered.  Verb understanding, agreeable to d/c home with outpt f/u.      Laray Anger, DO 06/24/13 0021

## 2013-06-22 LAB — URINE CULTURE

## 2013-07-22 ENCOUNTER — Ambulatory Visit (INDEPENDENT_AMBULATORY_CARE_PROVIDER_SITE_OTHER): Payer: Medicare Other | Admitting: Otolaryngology

## 2013-07-22 DIAGNOSIS — H903 Sensorineural hearing loss, bilateral: Secondary | ICD-10-CM

## 2013-07-22 DIAGNOSIS — H6120 Impacted cerumen, unspecified ear: Secondary | ICD-10-CM

## 2015-02-03 ENCOUNTER — Inpatient Hospital Stay (HOSPITAL_COMMUNITY)
Admission: EM | Admit: 2015-02-03 | Discharge: 2015-02-06 | DRG: 689 | Disposition: A | Discharge status: Home Health | Payer: Medicare HMO | Attending: Internal Medicine | Admitting: Internal Medicine

## 2015-02-03 ENCOUNTER — Encounter (HOSPITAL_COMMUNITY): Payer: Self-pay

## 2015-02-03 ENCOUNTER — Emergency Department (HOSPITAL_COMMUNITY): Payer: Medicare HMO

## 2015-02-03 DIAGNOSIS — R531 Weakness: Secondary | ICD-10-CM

## 2015-02-03 DIAGNOSIS — Z8249 Family history of ischemic heart disease and other diseases of the circulatory system: Secondary | ICD-10-CM

## 2015-02-03 DIAGNOSIS — E785 Hyperlipidemia, unspecified: Secondary | ICD-10-CM | POA: Diagnosis present

## 2015-02-03 DIAGNOSIS — E039 Hypothyroidism, unspecified: Secondary | ICD-10-CM | POA: Diagnosis present

## 2015-02-03 DIAGNOSIS — F329 Major depressive disorder, single episode, unspecified: Secondary | ICD-10-CM | POA: Diagnosis present

## 2015-02-03 DIAGNOSIS — Z833 Family history of diabetes mellitus: Secondary | ICD-10-CM

## 2015-02-03 DIAGNOSIS — N390 Urinary tract infection, site not specified: Secondary | ICD-10-CM | POA: Diagnosis not present

## 2015-02-03 DIAGNOSIS — I951 Orthostatic hypotension: Secondary | ICD-10-CM | POA: Diagnosis present

## 2015-02-03 DIAGNOSIS — Z9981 Dependence on supplemental oxygen: Secondary | ICD-10-CM

## 2015-02-03 DIAGNOSIS — F17200 Nicotine dependence, unspecified, uncomplicated: Secondary | ICD-10-CM | POA: Diagnosis present

## 2015-02-03 DIAGNOSIS — Z982 Presence of cerebrospinal fluid drainage device: Secondary | ICD-10-CM

## 2015-02-03 DIAGNOSIS — D509 Iron deficiency anemia, unspecified: Secondary | ICD-10-CM | POA: Diagnosis present

## 2015-02-03 DIAGNOSIS — G9341 Metabolic encephalopathy: Secondary | ICD-10-CM | POA: Diagnosis present

## 2015-02-03 DIAGNOSIS — E43 Unspecified severe protein-calorie malnutrition: Secondary | ICD-10-CM | POA: Diagnosis present

## 2015-02-03 DIAGNOSIS — E119 Type 2 diabetes mellitus without complications: Secondary | ICD-10-CM | POA: Diagnosis present

## 2015-02-03 DIAGNOSIS — K219 Gastro-esophageal reflux disease without esophagitis: Secondary | ICD-10-CM | POA: Diagnosis present

## 2015-02-03 DIAGNOSIS — G9340 Encephalopathy, unspecified: Secondary | ICD-10-CM | POA: Insufficient documentation

## 2015-02-03 DIAGNOSIS — Z681 Body mass index (BMI) 19 or less, adult: Secondary | ICD-10-CM

## 2015-02-03 DIAGNOSIS — E860 Dehydration: Secondary | ICD-10-CM | POA: Diagnosis present

## 2015-02-03 DIAGNOSIS — J449 Chronic obstructive pulmonary disease, unspecified: Secondary | ICD-10-CM | POA: Diagnosis present

## 2015-02-03 DIAGNOSIS — Z809 Family history of malignant neoplasm, unspecified: Secondary | ICD-10-CM

## 2015-02-03 DIAGNOSIS — Z85118 Personal history of other malignant neoplasm of bronchus and lung: Secondary | ICD-10-CM

## 2015-02-03 DIAGNOSIS — J9610 Chronic respiratory failure, unspecified whether with hypoxia or hypercapnia: Secondary | ICD-10-CM | POA: Diagnosis present

## 2015-02-03 DIAGNOSIS — I10 Essential (primary) hypertension: Secondary | ICD-10-CM | POA: Diagnosis present

## 2015-02-03 LAB — CBC WITH DIFFERENTIAL/PLATELET
BASOS PCT: 0 % (ref 0–1)
Basophils Absolute: 0 10*3/uL (ref 0.0–0.1)
EOS ABS: 0.1 10*3/uL (ref 0.0–0.7)
Eosinophils Relative: 1 % (ref 0–5)
HCT: 35.8 % — ABNORMAL LOW (ref 36.0–46.0)
HEMOGLOBIN: 11.9 g/dL — AB (ref 12.0–15.0)
Lymphocytes Relative: 19 % (ref 12–46)
Lymphs Abs: 1 10*3/uL (ref 0.7–4.0)
MCH: 29 pg (ref 26.0–34.0)
MCHC: 33.2 g/dL (ref 30.0–36.0)
MCV: 87.3 fL (ref 78.0–100.0)
MONOS PCT: 9 % (ref 3–12)
Monocytes Absolute: 0.5 10*3/uL (ref 0.1–1.0)
NEUTROS PCT: 71 % (ref 43–77)
Neutro Abs: 3.8 10*3/uL (ref 1.7–7.7)
PLATELETS: 244 10*3/uL (ref 150–400)
RBC: 4.1 MIL/uL (ref 3.87–5.11)
RDW: 16.9 % — ABNORMAL HIGH (ref 11.5–15.5)
WBC: 5.4 10*3/uL (ref 4.0–10.5)

## 2015-02-03 LAB — URINALYSIS, ROUTINE W REFLEX MICROSCOPIC
GLUCOSE, UA: NEGATIVE mg/dL
Ketones, ur: NEGATIVE mg/dL
Nitrite: POSITIVE — AB
Protein, ur: 30 mg/dL — AB
Specific Gravity, Urine: >1.03 — ABNORMAL HIGH (ref 1.005–1.030)
Urobilinogen, UA: 0.2 mg/dL (ref 0.0–1.0)
pH: 5.5 (ref 5.0–8.0)

## 2015-02-03 LAB — URINE MICROSCOPIC-ADD ON

## 2015-02-03 LAB — COMPREHENSIVE METABOLIC PANEL
ALBUMIN: 3.6 g/dL (ref 3.5–5.2)
ALT: 9 U/L (ref 0–35)
ANION GAP: 8 (ref 5–15)
AST: 10 U/L (ref 0–37)
Alkaline Phosphatase: 94 U/L (ref 39–117)
BILIRUBIN TOTAL: 0.3 mg/dL (ref 0.3–1.2)
BUN: 24 mg/dL — AB (ref 6–23)
CO2: 29 mmol/L (ref 19–32)
CREATININE: 0.84 mg/dL (ref 0.50–1.10)
Calcium: 9.4 mg/dL (ref 8.4–10.5)
Chloride: 99 mmol/L (ref 96–112)
GFR calc non Af Amer: 69 mL/min — ABNORMAL LOW (ref >90)
GFR, EST AFRICAN AMERICAN: 80 mL/min — AB (ref >90)
Glucose, Bld: 145 mg/dL — ABNORMAL HIGH (ref 70–99)
Potassium: 4.3 mmol/L (ref 3.5–5.1)
Sodium: 136 mmol/L (ref 135–145)
TOTAL PROTEIN: 7.5 g/dL (ref 6.0–8.3)

## 2015-02-03 LAB — TROPONIN I

## 2015-02-03 LAB — LACTIC ACID, PLASMA
LACTIC ACID, VENOUS: 1.3 mmol/L (ref 0.5–2.0)
Lactic Acid, Venous: 1.3 mmol/L (ref 0.5–2.0)

## 2015-02-03 MED ORDER — SODIUM CHLORIDE 0.9 % IV SOLN
INTRAVENOUS | Status: DC
  Administered 2015-02-03: 23:00:00 via INTRAVENOUS

## 2015-02-03 MED ORDER — DEXTROSE 5 % IV SOLN
1.0000 g | Freq: Once | INTRAVENOUS | Status: AC
  Administered 2015-02-03: 1 g via INTRAVENOUS
  Filled 2015-02-03: qty 10

## 2015-02-03 MED ORDER — IPRATROPIUM-ALBUTEROL 0.5-2.5 (3) MG/3ML IN SOLN
3.0000 mL | Freq: Once | RESPIRATORY_TRACT | Status: AC
  Administered 2015-02-03: 3 mL via RESPIRATORY_TRACT
  Filled 2015-02-03: qty 3

## 2015-02-03 NOTE — ED Notes (Signed)
Patient via [facility] c/o weakness and decreased po intake X2 days. A&OX4. Patient uses walker at home, also has dry cough. CBG per EMS 180

## 2015-02-03 NOTE — ED Provider Notes (Signed)
CSN: [id number]     Arrival date & time 02/03/15  1958 History   First MD Initiated Contact with Patient 02/03/15 2015     Chief Complaint  Patient presents with  . Weakness      HPI  Pt was seen at 2030. Per EMS and pt report, c/o gradual onset and worsening of persistent generalized weakness/fatigue for the past 2 to 3 days. Has been associated with moist cough and poor PO intake. Denies CP/palpitations, no SOB, no abd pain, no N/V/D, no syncope, no focal motor weakness, no tingling/numbness in extremities.   Past Medical History  Diagnosis Date  . Aneurysm     brain  . GERD (gastroesophageal reflux disease)   . Hypertension   . Anxiety   . Oxygen dependent     requires oxygen for at least 16 hours per day,   . Hypothyroidism   . COPD (chronic obstructive pulmonary disease)     on home oxygen  . Anemia     iron deficiency anemia  . Gastric erosions     causing anemia  . Chronic respiratory failure   . Anemia   . Gastric ulcer   . Borderline diabetes   . Cancer   . Lung cancer   . 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 H/O cerebral aneurysm repair 11/08/2012   Past Surgical History  Procedure Laterality Date  . Lung removal, partial    . Abdominal hysterectomy    . Esophagogastroduodenoscopy  05/11/2012    Procedure: ESOPHAGOGASTRODUODENOSCOPY (EGD);  Surgeon: Rogene Houston, MD;  Location: [facility];  Service: Endoscopy;  Laterality: N/A;  . Cataract extraction, bilateral     Family History  Problem Relation Age of Onset  . Hypertension Mother   . Hypertension Father   . Diabetes Mother   . Cancer Brother   . Diabetes Brother   . Diabetes Brother    History  Substance Use Topics  . Smoking status: Current Every Day Smoker -- 0.25 packs/day for 55 years  . Smokeless tobacco: Never Used  . Alcohol Use: No    Review of Systems ROS: Statement: All systems negative except as marked or noted in the HPI; Constitutional: Negative for fever and  chills. +generalized weakness/fatigue, +poor PO intake. ; ; Eyes: Negative for eye pain, redness and discharge. ; ; ENMT: Negative for ear pain, hoarseness, nasal congestion, sinus pressure and sore throat. ; ; Cardiovascular: Negative for chest pain, palpitations, diaphoresis, dyspnea and peripheral edema. ; ; Respiratory: +cough. Negative for wheezing and stridor. ; ; Gastrointestinal: Negative for nausea, vomiting, diarrhea, abdominal pain, blood in stool, hematemesis, jaundice and rectal bleeding. . ; ; Genitourinary: Negative for dysuria, flank pain and hematuria. ; ; Musculoskeletal: Negative for back pain and neck pain. Negative for swelling and trauma.; ; Skin: Negative for pruritus, rash, abrasions, blisters, bruising and skin lesion.; ; Neuro: Negative for headache, lightheadedness and neck stiffness. Negative for altered level of consciousness , altered mental status, extremity weakness, paresthesias, involuntary movement, seizure and syncope.     Allergies  Sulfa antibiotics  Home Medications   Prior to Admission medications   Medication Sig Start Date End Date Taking? Authorizing Provider  acetaminophen (TYLENOL) 500 MG tablet Take 1,000 mg by mouth every 4 (four) hours as needed. For pain    Historical Provider, MD  albuterol (PROVENTIL HFA;VENTOLIN HFA) 108 (90 BASE) MCG/ACT inhaler Inhale 2 puffs into the lungs 2 (two) times daily as needed. 01/17/10   Historical Provider, MD  albuterol (PROVENTIL) (2.5 MG/3ML) 0.083% nebulizer solution Take 2.5 mg by nebulization 2 (two) times daily.     Historical Provider, MD  amitriptyline (ELAVIL) 25 MG tablet Take 50 mg by mouth at bedtime.    Historical Provider, MD  arformoterol (BROVANA) 15 MCG/2ML NEBU Take 15 mcg by nebulization 2 (two) times daily.    Historical Provider, MD  atorvastatin (LIPITOR) 20 MG tablet Take 20 mg by mouth at bedtime.     Historical Provider, MD  busPIRone (BUSPAR) 10 MG tablet Take 10 mg by mouth 3 (three) times  daily as needed. For anxiety    Historical Provider, MD  CALCIUM-MAGNESIUM-ZINC PO Take 1 tablet by mouth every morning.     Historical Provider, MD  cephALEXin (KEFLEX) 500 MG capsule Take 1 capsule (500 mg total) by mouth 4 (four) times daily. 06/20/13   Francine Graven, DO  cholecalciferol (VITAMIN D) 1000 UNITS tablet Take 1,000 Units by mouth every morning.     Historical Provider, MD  fluticasone (FLONASE) 50 MCG/ACT nasal spray Place 2 sprays into the nose every morning.    Historical Provider, MD  levothyroxine (SYNTHROID, LEVOTHROID) 100 MCG tablet Take 100 mcg by mouth daily.    Historical Provider, MD  lisinopril (PRINIVIL,ZESTRIL) 20 MG tablet Take 10 mg by mouth every morning.    Historical Provider, MD  omeprazole (PRILOSEC) 40 MG capsule Take 1 capsule (40 mg total) by mouth 2 (two) times daily. 05/12/12   Kathie Dike, MD  theophylline (THEO-24) 300 MG 24 hr capsule Take 300 mg by mouth 2 (two) times daily.    Historical Provider, MD  tiotropium (SPIRIVA) 18 MCG inhalation capsule Place 18 mcg into inhaler and inhale daily.    Historical Provider, MD  venlafaxine XR (EFFEXOR-XR) 150 MG 24 hr capsule Take 150 mg by mouth daily with breakfast.     Historical Provider, MD   BP 137/81 mmHg  Pulse 77  Temp(Src) 98.5 F (36.9 C) (Rectal)  Resp 18  Ht 5\' 4"  (1.626 m)  Wt 115 lb (52.164 kg)  BMI 19.73 kg/m2  SpO2 100%   20:47 Orthostatic Vital Signs Orthostatic Lying  - BP- Lying: 151/80 mmHg ; Pulse- Lying: 80  Orthostatic Sitting - BP- Sitting: 139/96 mmHg ; Pulse- Sitting: 85  Orthostatic Standing at 0 minutes - BP- Standing at 0 minutes: 121/97 mmHg ; Pulse- Standing at 0 minutes: 90      Physical Exam  2035: Physical examination:  Nursing notes reviewed; Vital signs and O2 SAT reviewed;  Constitutional: Thin, frail. In no acute distress; Head:  Normocephalic, atraumatic; Eyes: EOMI, PERRL, No scleral icterus; ENMT: Mouth and pharynx normal, Mucous membranes dry; Neck:  Supple, Full range of motion, No lymphadenopathy; Cardiovascular: Regular rate and rhythm, No gallop; Respiratory: Breath sounds coarse & equal bilaterally, No wheezes. +moist cough during exam. Speaking full sentences with ease, Normal respiratory effort/excursion; Chest: Nontender, Movement normal; Abdomen: Soft, Nontender, Nondistended, Normal bowel sounds; Genitourinary: No CVA tenderness; Extremities: Pulses normal, No tenderness, No edema, No calf edema or asymmetry.; Neuro: AA&Ox3, Major CN grossly intact. No facial droop. Speech clear. No gross focal motor or sensory deficits in extremities.; Skin: Color normal, Warm, Dry.    ED Course  Procedures     EKG Interpretation   Date/Time:  Friday February 03 2015 20:28:51 EST Ventricular Rate:  77 PR Interval:  159 QRS Duration: 72 QT Interval:  389 QTC Calculation: 440 R Axis:   66 Text Interpretation:  Sinus rhythm Consider right atrial  enlargement  Artifact When compared with ECG of 02/14/2013 Rate slower Confirmed by  [facility]  MD, Nunzio Cory [phone]) on 02/03/2015 8:46:31 PM      MDM  MDM Reviewed: previous chart, nursing note and vitals Reviewed previous: labs and ECG Interpretation: labs and ECG     Results for orders placed or performed during the hospital encounter of 02/03/15  Urinalysis, Routine w reflex microscopic  Result Value Ref Range   Color, Urine YELLOW YELLOW   APPearance CLEAR CLEAR   Specific Gravity, Urine >1.030 (H) 1.005 - 1.030   pH 5.5 5.0 - 8.0   Glucose, UA NEGATIVE NEGATIVE mg/dL   Hgb urine dipstick SMALL (A) NEGATIVE   Bilirubin Urine SMALL (A) NEGATIVE   Ketones, ur NEGATIVE NEGATIVE mg/dL   Protein, ur 30 (A) NEGATIVE mg/dL   Urobilinogen, UA 0.2 0.0 - 1.0 mg/dL   Nitrite POSITIVE (A) NEGATIVE   Leukocytes, UA TRACE (A) NEGATIVE  CBC with Differential  Result Value Ref Range   WBC 5.4 4.0 - 10.5 K/uL   RBC 4.10 3.87 - 5.11 MIL/uL   Hemoglobin 11.9 (L) 12.0 - 15.0 g/dL   HCT 35.8 (L) 36.0 -  46.0 %   MCV 87.3 78.0 - 100.0 fL   MCH 29.0 26.0 - 34.0 pg   MCHC 33.2 30.0 - 36.0 g/dL   RDW 16.9 (H) 11.5 - 15.5 %   Platelets 244 150 - 400 K/uL   Neutrophils Relative % 71 43 - 77 %   Neutro Abs 3.8 1.7 - 7.7 K/uL   Lymphocytes Relative 19 12 - 46 %   Lymphs Abs 1.0 0.7 - 4.0 K/uL   Monocytes Relative 9 3 - 12 %   Monocytes Absolute 0.5 0.1 - 1.0 K/uL   Eosinophils Relative 1 0 - 5 %   Eosinophils Absolute 0.1 0.0 - 0.7 K/uL   Basophils Relative 0 0 - 1 %   Basophils Absolute 0.0 0.0 - 0.1 K/uL  Urine microscopic-add on  Result Value Ref Range   Squamous Epithelial / LPF RARE RARE   WBC, UA 21-50 <3 WBC/hpf   RBC / HPF 3-6 <3 RBC/hpf   Bacteria, UA MANY (A) RARE    2205:  +UTI, UC pending; will tx with IV rocephin. Workup pending. Will need admit. Sign out to Dr. Roderic Palau.   Francine Graven, DO 02/03/15 2216

## 2015-02-04 ENCOUNTER — Encounter (HOSPITAL_COMMUNITY): Payer: Self-pay | Admitting: *Deleted

## 2015-02-04 DIAGNOSIS — F329 Major depressive disorder, single episode, unspecified: Secondary | ICD-10-CM | POA: Diagnosis present

## 2015-02-04 DIAGNOSIS — E785 Hyperlipidemia, unspecified: Secondary | ICD-10-CM | POA: Diagnosis present

## 2015-02-04 DIAGNOSIS — J9610 Chronic respiratory failure, unspecified whether with hypoxia or hypercapnia: Secondary | ICD-10-CM | POA: Diagnosis present

## 2015-02-04 DIAGNOSIS — G9340 Encephalopathy, unspecified: Secondary | ICD-10-CM | POA: Insufficient documentation

## 2015-02-04 DIAGNOSIS — E43 Unspecified severe protein-calorie malnutrition: Secondary | ICD-10-CM | POA: Diagnosis not present

## 2015-02-04 DIAGNOSIS — E039 Hypothyroidism, unspecified: Secondary | ICD-10-CM | POA: Diagnosis present

## 2015-02-04 DIAGNOSIS — Z681 Body mass index (BMI) 19 or less, adult: Secondary | ICD-10-CM | POA: Diagnosis not present

## 2015-02-04 DIAGNOSIS — I951 Orthostatic hypotension: Secondary | ICD-10-CM

## 2015-02-04 DIAGNOSIS — N390 Urinary tract infection, site not specified: Secondary | ICD-10-CM | POA: Insufficient documentation

## 2015-02-04 DIAGNOSIS — Z8249 Family history of ischemic heart disease and other diseases of the circulatory system: Secondary | ICD-10-CM | POA: Diagnosis not present

## 2015-02-04 DIAGNOSIS — D509 Iron deficiency anemia, unspecified: Secondary | ICD-10-CM

## 2015-02-04 DIAGNOSIS — Z833 Family history of diabetes mellitus: Secondary | ICD-10-CM | POA: Diagnosis not present

## 2015-02-04 DIAGNOSIS — Z85118 Personal history of other malignant neoplasm of bronchus and lung: Secondary | ICD-10-CM | POA: Diagnosis not present

## 2015-02-04 DIAGNOSIS — J449 Chronic obstructive pulmonary disease, unspecified: Secondary | ICD-10-CM | POA: Diagnosis not present

## 2015-02-04 DIAGNOSIS — K219 Gastro-esophageal reflux disease without esophagitis: Secondary | ICD-10-CM | POA: Diagnosis present

## 2015-02-04 DIAGNOSIS — J438 Other emphysema: Secondary | ICD-10-CM | POA: Diagnosis not present

## 2015-02-04 DIAGNOSIS — Z9981 Dependence on supplemental oxygen: Secondary | ICD-10-CM | POA: Diagnosis not present

## 2015-02-04 DIAGNOSIS — R531 Weakness: Secondary | ICD-10-CM

## 2015-02-04 DIAGNOSIS — G9341 Metabolic encephalopathy: Secondary | ICD-10-CM | POA: Diagnosis present

## 2015-02-04 DIAGNOSIS — E119 Type 2 diabetes mellitus without complications: Secondary | ICD-10-CM | POA: Diagnosis present

## 2015-02-04 DIAGNOSIS — E860 Dehydration: Secondary | ICD-10-CM | POA: Diagnosis present

## 2015-02-04 DIAGNOSIS — F17200 Nicotine dependence, unspecified, uncomplicated: Secondary | ICD-10-CM | POA: Diagnosis present

## 2015-02-04 DIAGNOSIS — I10 Essential (primary) hypertension: Secondary | ICD-10-CM | POA: Diagnosis present

## 2015-02-04 DIAGNOSIS — Z809 Family history of malignant neoplasm, unspecified: Secondary | ICD-10-CM | POA: Diagnosis not present

## 2015-02-04 LAB — CBC
HCT: 32.1 % — ABNORMAL LOW (ref 36.0–46.0)
Hemoglobin: 10.6 g/dL — ABNORMAL LOW (ref 12.0–15.0)
MCH: 29.1 pg (ref 26.0–34.0)
MCHC: 33 g/dL (ref 30.0–36.0)
MCV: 88.2 fL (ref 78.0–100.0)
Platelets: 283 10*3/uL (ref 150–400)
RBC: 3.64 MIL/uL — ABNORMAL LOW (ref 3.87–5.11)
RDW: 16.8 % — ABNORMAL HIGH (ref 11.5–15.5)
WBC: 5 10*3/uL (ref 4.0–10.5)

## 2015-02-04 LAB — BASIC METABOLIC PANEL
Anion gap: 6 (ref 5–15)
BUN: 16 mg/dL (ref 6–23)
CALCIUM: 8.5 mg/dL (ref 8.4–10.5)
CO2: 28 mmol/L (ref 19–32)
Chloride: 103 mmol/L (ref 96–112)
Creatinine, Ser: 0.6 mg/dL (ref 0.50–1.10)
GFR calc Af Amer: >90 mL/min (ref >90)
Glucose, Bld: 116 mg/dL — ABNORMAL HIGH (ref 70–99)
Potassium: 3.6 mmol/L (ref 3.5–5.1)
Sodium: 137 mmol/L (ref 135–145)

## 2015-02-04 MED ORDER — PHILLIPS COLON HEALTH PO CAPS: 1.0000 | ORAL_CAPSULE | Freq: Every day | ORAL | Status: DC

## 2015-02-04 MED ORDER — ALBUTEROL SULFATE HFA 108 (90 BASE) MCG/ACT IN AERS: 2.0000 | INHALATION_SPRAY | RESPIRATORY_TRACT | Status: DC | PRN

## 2015-02-04 MED ORDER — FAMOTIDINE 20 MG PO TABS
20.0000 mg | ORAL_TABLET | Freq: Every day | ORAL | Status: DC
  Administered 2015-02-04 – 2015-02-06 (×3): 20 mg via ORAL
  Filled 2015-02-04 (×3): qty 1

## 2015-02-04 MED ORDER — ALBUTEROL SULFATE (2.5 MG/3ML) 0.083% IN NEBU: 2.5000 mg | INHALATION_SOLUTION | RESPIRATORY_TRACT | Status: DC | PRN

## 2015-02-04 MED ORDER — DEXTROSE 5 % IV SOLN
INTRAVENOUS | Status: AC
  Filled 2015-02-04: qty 10

## 2015-02-04 MED ORDER — TIOTROPIUM BROMIDE MONOHYDRATE 18 MCG IN CAPS
18.0000 ug | ORAL_CAPSULE | Freq: Every day | RESPIRATORY_TRACT | Status: DC
  Administered 2015-02-04 – 2015-02-06 (×3): 18 ug via RESPIRATORY_TRACT
  Filled 2015-02-04: qty 5

## 2015-02-04 MED ORDER — LEVOTHYROXINE SODIUM 100 MCG PO TABS
100.0000 ug | ORAL_TABLET | Freq: Every day | ORAL | Status: DC
  Administered 2015-02-04 – 2015-02-06 (×3): 100 ug via ORAL
  Filled 2015-02-04 (×3): qty 1

## 2015-02-04 MED ORDER — LISINOPRIL 10 MG PO TABS
10.0000 mg | ORAL_TABLET | Freq: Every day | ORAL | Status: DC
  Administered 2015-02-04 – 2015-02-05 (×2): 10 mg via ORAL
  Filled 2015-02-04 (×2): qty 1

## 2015-02-04 MED ORDER — RISAQUAD PO CAPS
1.0000 | ORAL_CAPSULE | Freq: Every day | ORAL | Status: DC
  Administered 2015-02-04 – 2015-02-06 (×3): 1 via ORAL
  Filled 2015-02-04 (×3): qty 1

## 2015-02-04 MED ORDER — SODIUM CHLORIDE 0.9 % IV SOLN
INTRAVENOUS | Status: DC
Start: 2015-02-04 — End: 2015-02-04
  Administered 2015-02-04: 01:00:00 via INTRAVENOUS

## 2015-02-04 MED ORDER — AMITRIPTYLINE HCL 25 MG PO TABS
50.0000 mg | ORAL_TABLET | Freq: Every day | ORAL | Status: DC
  Administered 2015-02-04 – 2015-02-05 (×2): 50 mg via ORAL
  Filled 2015-02-04 (×2): qty 2

## 2015-02-04 MED ORDER — ONDANSETRON HCL 4 MG/2ML IJ SOLN: 4.0000 mg | Freq: Four times a day (QID) | INTRAMUSCULAR | Status: DC | PRN

## 2015-02-04 MED ORDER — CEFTRIAXONE SODIUM IN DEXTROSE 20 MG/ML IV SOLN
1.0000 g | INTRAVENOUS | Status: DC
  Administered 2015-02-04 – 2015-02-05 (×2): 1 g via INTRAVENOUS
  Filled 2015-02-04 (×2): qty 10

## 2015-02-04 MED ORDER — ONDANSETRON HCL 4 MG PO TABS: 4.0000 mg | ORAL_TABLET | Freq: Four times a day (QID) | ORAL | Status: DC | PRN

## 2015-02-04 MED ORDER — THEOPHYLLINE ER 300 MG PO CP24
300.0000 mg | ORAL_CAPSULE | Freq: Two times a day (BID) | ORAL | Status: DC
  Filled 2015-02-04 (×5): qty 1

## 2015-02-04 MED ORDER — ACETAMINOPHEN 500 MG PO TABS: 500.0000 mg | ORAL_TABLET | ORAL | Status: DC | PRN

## 2015-02-04 MED ORDER — ATORVASTATIN CALCIUM 20 MG PO TABS
20.0000 mg | ORAL_TABLET | Freq: Every day | ORAL | Status: DC
  Administered 2015-02-04 – 2015-02-05 (×2): 20 mg via ORAL
  Filled 2015-02-04 (×2): qty 1

## 2015-02-04 MED ORDER — FLUTICASONE PROPIONATE 50 MCG/ACT NA SUSP
2.0000 | NASAL | Status: DC
  Administered 2015-02-04 – 2015-02-06 (×3): 2 via NASAL
  Filled 2015-02-04: qty 16

## 2015-02-04 MED ORDER — THEOPHYLLINE ER 300 MG PO TB12
300.0000 mg | ORAL_TABLET | Freq: Two times a day (BID) | ORAL | Status: DC
  Administered 2015-02-04 – 2015-02-06 (×5): 300 mg via ORAL
  Filled 2015-02-04 (×4): qty 1

## 2015-02-04 MED ORDER — FERROUS SULFATE 325 (65 FE) MG PO TABS
325.0000 mg | ORAL_TABLET | Freq: Three times a day (TID) | ORAL | Status: DC
  Administered 2015-02-04 – 2015-02-06 (×6): 325 mg via ORAL
  Filled 2015-02-04 (×6): qty 1

## 2015-02-04 NOTE — Progress Notes (Signed)
Acknowledgment of nutrition consult for diet education. RD not on site today. Will f/u on Monday if pt still in hospital.   Burtis Junes RD, LDN Nutrition Pager: [phone] 02/04/2015 11:09 AM

## 2015-02-04 NOTE — H&P (Signed)
PCP:   PROVIDER NOT IN SYSTEM   Chief Complaint:  weakness  HPI: 69 yo female multiple medical problems brought in by family members for generalized weakness worse than normal, and confusion.  Has not been eating and drinking lately.  Pt denies any fevers.  No pain.  She says her family has been having "a hard time understanding her".  She is alert and oriented.  Denies any n/v/d.  No swelling anywhere.  No dysuria.  No suprapubic pain.  Does say she is weaker than normal, just hasnt been feeling right.  No focal neurological symptoms.  Found to have a uti and mildly orthostatic.  Family  Not present.  Review of Systems:  Positive and negative as per HPI otherwise all other systems are negative  Past Medical History: Past Medical History  Diagnosis Date  . Aneurysm     brain  . GERD (gastroesophageal reflux disease)   . Hypertension   . Anxiety   . Oxygen dependent     requires oxygen for at least 16 hours per day,   . Hypothyroidism   . COPD (chronic obstructive pulmonary disease)     on home oxygen  . Anemia     iron deficiency anemia  . Gastric erosions     causing anemia  . Chronic respiratory failure   . Gastric ulcer   . Borderline diabetes   . Cancer   . Lung cancer   . 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 H/O cerebral aneurysm repair 11/08/2012   Past Surgical History  Procedure Laterality Date  . Lung removal, partial    . Abdominal hysterectomy    . Esophagogastroduodenoscopy  05/11/2012    Procedure: ESOPHAGOGASTRODUODENOSCOPY (EGD);  Surgeon: Rogene Houston, MD;  Location: [facility];  Service: Endoscopy;  Laterality: N/A;  . Cataract extraction, bilateral      Medications: Prior to Admission medications   Medication Sig Start Date End Date Taking? Authorizing Provider  acetaminophen (TYLENOL) 500 MG tablet Take 500-1,000 mg by mouth every 4 (four) hours as needed for headache. For pain   Yes Historical Provider, MD  albuterol  (PROVENTIL HFA;VENTOLIN HFA) 108 (90 BASE) MCG/ACT inhaler Inhale 2 puffs into the lungs 2 (two) times daily as needed. 01/17/10  Yes Historical Provider, MD  albuterol (PROVENTIL) (2.5 MG/3ML) 0.083% nebulizer solution Take 2.5 mg by nebulization 2 (two) times daily.    Yes Historical Provider, MD  amitriptyline (ELAVIL) 25 MG tablet Take 50 mg by mouth at bedtime.   Yes Historical Provider, MD  atorvastatin (LIPITOR) 20 MG tablet Take 20 mg by mouth at bedtime.    Yes Historical Provider, MD  busPIRone (BUSPAR) 10 MG tablet Take 10 mg by mouth 3 (three) times daily as needed. For anxiety   Yes Historical Provider, MD  CALCIUM-MAGNESIUM-ZINC PO Take 1 tablet by mouth every morning.    Yes Historical Provider, MD  cholecalciferol (VITAMIN D) 1000 UNITS tablet Take 5,000 Units by mouth every morning.    Yes Historical Provider, MD  ferrous sulfate 325 (65 FE) MG tablet Take 325 mg by mouth 3 (three) times daily with meals.   Yes Historical Provider, MD  fluticasone (FLONASE) 50 MCG/ACT nasal spray Place 2 sprays into the nose every morning.   Yes Historical Provider, MD  furosemide (LASIX) 20 MG tablet Take 20 mg by mouth daily as needed for fluid.   Yes Historical Provider, MD  levothyroxine (SYNTHROID, LEVOTHROID) 100 MCG tablet Take 100 mcg by mouth daily.  Yes Historical Provider, MD  lisinopril (PRINIVIL,ZESTRIL) 20 MG tablet Take 10 mg by mouth at bedtime.    Yes Historical Provider, MD  metFORMIN (GLUCOPHAGE) 500 MG tablet Take 500 mg by mouth daily. 01/09/15  Yes Historical Provider, MD  omeprazole (PRILOSEC) 40 MG capsule Take 1 capsule (40 mg total) by mouth 2 (two) times daily. 05/12/12  Yes Kathie Dike, MD  potassium gluconate 595 MG TABS tablet Take 595 mg by mouth daily.   Yes Historical Provider, MD  Probiotic Product ([place]) CAPS Take 1 capsule by mouth daily.   Yes Historical Provider, MD  ranitidine (ZANTAC) 150 MG tablet Take 150 mg by mouth daily as needed for  heartburn.   Yes Historical Provider, MD  theophylline (THEO-24) 300 MG 24 hr capsule Take 300 mg by mouth 2 (two) times daily.   Yes Historical Provider, MD  tiotropium (SPIRIVA) 18 MCG inhalation capsule Place 18 mcg into inhaler and inhale daily.   Yes Historical Provider, MD    Allergies:   Allergies  Allergen Reactions  . Other Other (See Comments)    Cannot take aspirin due to blood disorder  . Sulfa Antibiotics Other (See Comments)    HANDS,FEET AND MOUTH BLISTERING    Social History:  reports that she has been smoking.  She has never used smokeless tobacco. She reports that she does not drink alcohol or use illicit drugs.  Family History: Family History  Problem Relation Age of Onset  . Hypertension Mother   . Hypertension Father   . Diabetes Mother   . Cancer Brother   . Diabetes Brother   . Diabetes Brother     Physical Exam: Filed Vitals:   02/03/15 2008 02/03/15 2033 02/03/15 2115 02/03/15 2249  BP: 137/81   150/88  Pulse: 77   77  Temp: 97.6 F (36.4 C) 98.5 F (36.9 C)    TempSrc: Axillary Rectal    Resp: 18   29  Height: 5\' 4"  (1.626 m)     Weight: 52.164 kg (115 lb)     SpO2: 100%  98% 98%   General appearance: alert, cooperative and no distress frail Head: Normocephalic, without obvious abnormality, atraumatic Eyes: negative Nose: Nares normal. Septum midline. Mucosa normal. No drainage or sinus tenderness. Neck: no JVD and supple, symmetrical, trachea midline Lungs: clear to auscultation bilaterally Heart: regular rate and rhythm, S1, S2 normal, no murmur, click, rub or gallop Abdomen: soft, non-tender; bowel sounds normal; no masses,  no organomegaly Extremities: extremities normal, atraumatic, no cyanosis or edema Pulses: 2+ and symmetric Skin: Skin color, texture, turgor normal. No rashes or lesions Neurologic: Grossly normal  Labs on Admission:   Recent Labs  02/03/15 2111  NA 136  K 4.3  CL 99  CO2 29  GLUCOSE 145*  BUN 24*   CREATININE 0.84  CALCIUM 9.4    Recent Labs  02/03/15 2111  AST 10  ALT 9  ALKPHOS 94  BILITOT 0.3  PROT 7.5  ALBUMIN 3.6    Recent Labs  02/03/15 2111  WBC 5.4  NEUTROABS 3.8  HGB 11.9*  HCT 35.8*  MCV 87.3  PLT 244    Recent Labs  02/03/15 2111  TROPONINI <0.03   Radiological Exams on Admission: Dg Chest 1 View  02/03/2015   CLINICAL DATA:  Weakness and fatigue for 3 days.  Cough.  EXAM: CHEST  1 VIEW  COMPARISON:  02/14/2013  FINDINGS: Pulmonary hyperinflation and emphysematous change. There is no edema, consolidation, effusion,  or pneumothorax. Unchanged sequela of right lung surgery for lung cancer (likely upper lobectomy) with chronic distortion of right upper ribs, distorted right hilum, and right-sided volume loss. Normal heart size and stable aortic contours.  There is a extensive dystrophic calcification of the ventriculoperitoneal shunt catheter as it traverses the left chest. The catheter is difficult to visualize at the level of the abdomen.  IMPRESSION: 1.  No active disease. 2. Emphysema and scarring related to right lobectomy.   Electronically Signed   By: Monte Fantasia M.D.   On: 02/03/2015 23:00   Ct Head Wo Contrast  02/03/2015   CLINICAL DATA:  Paralyzed weakness and decreased oral intake for 2 days. Initial encounter.  EXAM: CT HEAD WITHOUT CONTRAST  TECHNIQUE: Contiguous axial images were obtained from the base of the skull through the vertex without intravenous contrast.  COMPARISON:  None.  FINDINGS: There is no evidence of acute infarction, mass lesion, or intra- or extra-axial hemorrhage on CT.  A left-sided ventriculoperitoneal shunt is seen extending into the left lateral ventricle. There is no evidence of hydrocephalus. 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  The posterior fossa, including the cerebellum, brainstem and fourth ventricle, is within normal limits.  The third and lateral ventricles, and basal ganglia are unremarkable in appearance. No mass effect or midline shift is seen.  There is no evidence of fracture; bilateral partially healed craniotomy flaps are seen. 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 The orbits are within normal limits. The paranasal sinuses and mastoid air cells are well-aerated. No significant soft tissue abnormalities are seen.  IMPRESSION: 1. No acute intracranial pathology seen on CT. 2. Left ventriculoperitoneal shunt noted in expected position. No evidence of hydrocephalus at this time. 3. 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 4. Postoperative change seen bilaterally.   Electronically Signed   By: Garald Balding M.D.   On: 02/03/2015 23:00    Assessment/Plan  69 yo female with generalized weakness, mild dehydration and uti  Principal Problem:   UTI (lower urinary tract infection)-  Rocephin.  Urine cx done.  Nontoxic.  ivf overnight.  obs on medical.  Active Problems:   Stable unless o/w noted   Microcytic anemia   COPD (chronic obstructive pulmonary disease)- cont home oxygen   History of lung cancer   Severe protein-calorie malnutrition   VP (ventriculoperitoneal) shunt status- ct head neg acute issues, no hydro.   Orthostatic hypotension-  Ivf, hold bp meds   Weakness generalized- secondary to #1  obs on medical.  Full code.  Carnisha Feltz A 02/04/2015, 12:02 AM

## 2015-02-04 NOTE — Progress Notes (Signed)
UR completed 

## 2015-02-04 NOTE — Progress Notes (Addendum)
Patient ID: Tracey Martinez, female   DOB: 14-Sep-1946, 69 y.o.   MRN: [id number] [facility] PROGRESS NOTE  ELAIZA SHOBERG RKY:[id number] DOB: 04/01/1946 DOA: 02/03/2015 PCP: PROVIDER NOT IN SYSTEM  Brief narrative:    69 yo female with past medical history of hypertension, depression, iron deficiency anemia who presented to [facility] ED with worsening generalzied weakness, confusion and poor po intake for past day or so prior to this admission. Pt was found to have UTI on the admission and was started on empiric rocephin.    Assessment/Plan:    Principal Problem: UTI (lower urinary tract infection) - UA on admission showed trace leukocytes, many bacteria - started Rocephin IV daily - follow up urine culture result  Active Problems: Acute metabolic encephalopathy - likely due to UTI - no acute findings on CT head  - still little drowsy this am, will continue to monitor mental status - continue to treat for UTI - PT eval once pt able to participate, order placed   Microcytic anemia / iron deficiency anemia - resume ferrous sulfate supplementation  COPD (chronic obstructive pulmonary disease) - stable, resume albuterol inhaler PRN and Spiriva daily - also resume theophylline capsule 300 mg PO BID  Severe protein-calorie malnutrition - nutrition consulted   Weakness generalized - possibly from acute infection - PT eval once pt able to participate, order placed  Essential hypertension - resumed lisinopril - renal function WNL  Hypothyroidism - resume synthroid   Dyslipidemia - continue atorvastatin  Depression - continue Elavil      DVT Prophylaxis  - SCD's bilaterally    Code Status: Full.  Family Communication:  Family not at the bedside this morning. Disposition Plan: Home when stable.   IV access:  Peripheral IV  Procedures and diagnostic studies:    Dg Chest 1 View 02/03/2015   1.  No active disease. 2. Emphysema and scarring related to right lobectomy.    Electronically Signed   By: Monte Fantasia M.D.   On: 02/03/2015 23:00   Ct Head Wo Contrast 02/03/2015  1. No acute intracranial pathology seen on CT. 2. Left ventriculoperitoneal shunt noted in expected position. No evidence of hydrocephalus at this time. 3. 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 4. Postoperative change seen bilaterally.   Electronically Signed   By: Garald Balding M.D.   On: 02/03/2015 23:00    Medical Consultants:  None   Other Consultants:  Physical therapy Nutrition  IAnti-Infectives:   Rocephin 02/03/2015 -->   Leisa Lenz, MD  [facility] Pager [phone]  If 7PM-7AM, please contact night-coverage [email or web address] Password [facility] 02/04/2015, 10:23 AM      HPI/Subjective: No acute overnight events.  Objective: Filed Vitals:   02/04/15 0000 02/04/15 0004 02/04/15 0030 02/04/15 0100  BP: 162/82 156/70 161/74   Pulse:  68    Temp:  97.6 F (36.4 C)    TempSrc:  Oral    Resp: 27 17 15    Height:      Weight:  43.772 kg (96 lb 8 oz)    SpO2:  100%  98%    Intake/Output Summary (Last 24 hours) at 02/04/15 1023 Last data filed at 02/03/15 2035  Gross per 24 hour  Intake      0 ml  Output     35 ml  Net    -35 ml    Exam:   General:  Pt is drowsy, not in acute distress  Cardiovascular: Regular rate and rhythm, S1/S2 (+)  Respiratory: no wheezing, no crackles, no rhonchi  Abdomen: Soft, non tender, non distended, bowel sounds present  Extremities: No edema, pulses DP and PT palpable bilaterally  Neuro: Grossly nonfocal  Data Reviewed: Basic Metabolic Panel:  Recent Labs Lab 02/03/15 2111 02/04/15 0640  NA 136 137  K 4.3 3.6  CL 99 103  CO2 29 28  GLUCOSE 145* 116*  BUN 24* 16  CREATININE 0.84 0.60  CALCIUM 9.4 8.5   Liver Function Tests:  Recent Labs Lab 02/03/15 2111  AST 10  ALT 9  ALKPHOS 94  BILITOT 0.3  PROT 7.5  ALBUMIN 3.6   No  results for input(s): LIPASE, AMYLASE in the last 168 hours. No results for input(s): AMMONIA in the last 168 hours. CBC:  Recent Labs Lab 02/03/15 2111 02/04/15 0640  WBC 5.4 5.0  NEUTROABS 3.8  --   HGB 11.9* 10.6*  HCT 35.8* 32.1*  MCV 87.3 88.2  PLT 244 283   Cardiac Enzymes:  Recent Labs Lab 02/03/15 2111  TROPONINI <0.03   BNP: Invalid input(s): POCBNP CBG: No results for input(s): GLUCAP in the last 168 hours.  No results found for this or any previous visit (from the past 240 hour(s)).   Scheduled Meds: . cefTRIAXone (ROCEPHIN)  IV  1 g Intravenous Q24H   Continuous Infusions: . sodium chloride 75 mL/hr at 02/04/15 0101

## 2015-02-05 DIAGNOSIS — J449 Chronic obstructive pulmonary disease, unspecified: Secondary | ICD-10-CM

## 2015-02-05 DIAGNOSIS — E43 Unspecified severe protein-calorie malnutrition: Secondary | ICD-10-CM

## 2015-02-05 LAB — THEOPHYLLINE LEVEL: Theophylline Lvl: <0.5 ug/mL — ABNORMAL LOW (ref 10.0–20.0)

## 2015-02-05 NOTE — Progress Notes (Signed)
Patient ID: Tracey Martinez, female   DOB: [birth year], 69 y.o.   MRN: [id number] [facility] PROGRESS NOTE  ANSLEY MANGIAPANE PTW:[id number] DOB: Apr 11, 1946 DOA: 02/03/2015 PCP: PROVIDER NOT IN SYSTEM  Brief narrative:    69 yo female with past medical history of hypertension, depression, iron deficiency anemia who presented to [facility] ED with worsening generalzied weakness, confusion and poor po intake for past day or so prior to this admission. Pt was found to have UTI on the admission and was started on empiric rocephin.    Assessment/Plan:     Principal Problem: UTI (lower urinary tract infection) - Trace leukocytes and many bacteria seen on urinalysis obtained on the admission. - Patient started on Rocephin daily. Urine culture is pending   Active Problems: Acute metabolic encephalopathy - Likely secondary to urinary tract infection. Mental status at baseline this morning. Patient is alert, oriented to time, place and person. - No acute intracranial findings identified on CT head. - Appreciate physical therapy evaluation.  Microcytic anemia / iron deficiency anemia - Continue iron supplements.  COPD (chronic obstructive pulmonary disease) - Respiratory status stable. Continue Spiriva daily and theophylline twice daily - Continue albuterol inhaler as needed for shortness of breath or wheezing  Severe protein-calorie malnutrition - Advised on nutrition. Nutrition consulted. - Diet as tolerated.  Weakness generalized - Patient reports feeling better this morning. Needs physical therapy evaluation prior to discharge.  Essential hypertension - Continue lisinopril  Hypothyroidism - Continue Synthroid  Dyslipidemia - Continue statin therapy  Depression - Continue Elavil      DVT Prophylaxis  - SCD's bilaterally    Code Status: Full.  Family Communication:  plan of care discussed with the patient Disposition Plan: Patient is on IV antibiotics for urinary tract  infection. She needs physical therapy evaluation prior to discharge for safety discharge plan.  IV access:  Peripheral IV  Procedures and diagnostic studies:    Dg Chest 1 View 02/03/2015   1.  No active disease. 2. Emphysema and scarring related to right lobectomy.   Electronically Signed   By: Monte Fantasia M.D.   On: 02/03/2015 23:00   Ct Head Wo Contrast 02/03/2015  1. No acute intracranial pathology seen on CT. 2. Left ventriculoperitoneal shunt noted in expected position. No evidence of hydrocephalus at this time. 3. 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 4. Postoperative change seen bilaterally.   Electronically Signed   By: Garald Balding M.D.   On: 02/03/2015 23:00    Medical Consultants:  None   Other Consultants:  Physical therapy Nutrition  IAnti-Infectives:   Rocephin 02/03/2015 -->  Leisa Lenz, MD  [facility] Pager [phone]  If 7PM-7AM, please contact night-coverage [email or web address] Password [email or web address] 02/05/2015, 8:05 AM   LOS: 1 day    HPI/Subjective: No acute overnight events.  Objective: Filed Vitals:   02/04/15 1408 02/04/15 1515 02/04/15 2252 02/05/15 0550  BP:  124/68 122/72 128/68  Pulse:  80 74 74  Temp:  97.8 F (36.6 C) 98.1 F (36.7 C) 98.5 F (36.9 C)  TempSrc:  Oral Oral Oral  Resp:  20 20 20   Height:      Weight:      SpO2: 98% 100% 100% 100%    Intake/Output Summary (Last 24 hours) at 02/05/15 0805 Last data filed at 02/04/15 1846  Gross per 24 hour  Intake    480 ml  Output      0  ml  Net    480 ml    Exam:   General:  Pt is alert, no distress  Cardiovascular: Regular rate and rhythm, S1/S2 appreciated  Respiratory: Clear to auscultation bilaterally, no wheezing, no crackles, no rhonchi  Abdomen: Nontender abdomen, non-distended, pressure bowel sounds  Extremities: No lower extremity swelling, pulses palpable  Neuro: No focal  deficits  Data Reviewed: Basic Metabolic Panel:  Recent Labs Lab 02/03/15 2111 02/04/15 0640  NA 136 137  K 4.3 3.6  CL 99 103  CO2 29 28  GLUCOSE 145* 116*  BUN 24* 16  CREATININE 0.84 0.60  CALCIUM 9.4 8.5   Liver Function Tests:  Recent Labs Lab 02/03/15 2111  AST 10  ALT 9  ALKPHOS 94  BILITOT 0.3  PROT 7.5  ALBUMIN 3.6   No results for input(s): LIPASE, AMYLASE in the last 168 hours. No results for input(s): AMMONIA in the last 168 hours. CBC:  Recent Labs Lab 02/03/15 2111 02/04/15 0640  WBC 5.4 5.0  NEUTROABS 3.8  --   HGB 11.9* 10.6*  HCT 35.8* 32.1*  MCV 87.3 88.2  PLT 244 283   Cardiac Enzymes:  Recent Labs Lab 02/03/15 2111  TROPONINI <0.03   BNP: Invalid input(s): POCBNP CBG: No results for input(s): GLUCAP in the last 168 hours.  No results found for this or any previous visit (from the past 240 hour(s)).   Scheduled Meds: . acidophilus  1 capsule Oral Daily  . amitriptyline  50 mg Oral QHS  . atorvastatin  20 mg Oral QHS  . cefTRIAXone (ROCEPHIN)  IV  1 g Intravenous Q24H  . famotidine  20 mg Oral Daily  . ferrous sulfate  325 mg Oral TID WC  . fluticasone  2 spray Each Nare [id number]  . levothyroxine  100 mcg Oral QAC breakfast  . lisinopril  10 mg Oral QHS  . theophylline  300 mg Oral Q12H  . tiotropium  18 mcg Inhalation Daily   Continuous Infusions:

## 2015-02-06 MED ORDER — DEXTROSE 5 % IV SOLN
1.0000 g | INTRAVENOUS | Status: DC
  Filled 2015-02-06: qty 10

## 2015-02-06 MED ORDER — RISAQUAD PO CAPS: 1.0000 | ORAL_CAPSULE | Freq: Every day | ORAL | Status: AC

## 2015-02-06 NOTE — Progress Notes (Signed)
Patient states understanding of discharge instructions.  

## 2015-02-06 NOTE — Care Management Note (Addendum)
    Page 1 of 2   02/06/2015     1:29:05 PM CARE MANAGEMENT NOTE 02/06/2015  Patient:  Tracey Martinez, Tracey Martinez   Account Number:  [id number]  Date Initiated:  02/06/2015  Documentation initiated by:  Theophilus Kinds  Subjective/Objective Assessment:   Pt admitted from home with UTI/encephalopathy. Pt lives with her husband and son and will return home at discharge. Pt has a rollator for home use.     Action/Plan:   PT recommends HH PT. Pt agrees and would like [facility]. Referral called and faxed. [place] services to start within 48 hours of discharge. No DME needs noted. Pt and pts nurse aware of discharge arrangements.   Anticipated DC Date:  02/06/2015   Anticipated DC Plan:  [place]  CM consult      [facility] Choice  HOME HEALTH   Choice offered to / List presented to:  C-1 Patient        [place] arranged  [place].   Status of service:  Completed, signed off Medicare Important Message given?  YES (If response is "NO", the following Medicare IM given date fields will be blank) Date Medicare IM given:  02/06/2015 Medicare IM given by:  Theophilus Kinds Date Additional Medicare IM given:   Additional Medicare IM given by:    Discharge Disposition:  [place]  Per UR Regulation:    If discussed at Long Length of Stay Meetings, dates discussed:    Comments:  02/06/15 [place], RN BSN [place] [facility] is not in network with pts insurance therefore pt would have a 20% coapy for [facility] services. Pt has used [facility] in the past per the husband and they are in network with pts insurance. Referral called to [facility]. Pt and pts nurse aware of change in [facility] services. 02/06/15 [place], RN BSN CM

## 2015-02-06 NOTE — Discharge Summary (Addendum)
Physician Discharge Summary  NALANI ANDREEN LYY:[id number] DOB: Oct 17, 1946 DOA: 02/03/2015  PCP: PROVIDER NOT IN SYSTEM  Admit date: 02/03/2015 Discharge date: 02/06/2015  Recommendations for Outpatient Follow-up:  1. Check CBC and BMP during next scheduled appointment with primary care physician. 2. Patient received Rocephin for 3 days in hospital. Antibiotics not prescribed on discharge.  Discharge Diagnoses:  Principal Problem:   UTI (lower urinary tract infection) Active Problems:   Microcytic anemia   COPD (chronic obstructive pulmonary disease)   History of lung cancer   Severe protein-calorie malnutrition   VP (ventriculoperitoneal) shunt status   Orthostatic hypotension   Weakness generalized   Acute encephalopathy    Discharge Condition: stable   Diet recommendation: as tolerated   History of present illness:  69 yo female with past medical history of hypertension, depression, iron deficiency anemia who presented to [facility] ED with worsening generalzied weakness, confusion and poor po intake for past day or so prior to this admission. Pt was found to have UTI on the admission and was started on empiric rocephin.   Assessment/Plan:    Principal Problem: UTI (lower urinary tract infection) - Trace leukocytes and many bacteria seen on urinalysis obtained on the admission. - patient received Rocephin for 3 days in hospital. Not required to take antibiotics beyond discharge. This is uncomplicated UTI and 3 days of IV antibiotics will suffice.   Active Problems: Acute metabolic encephalopathy - Likely secondary to urinary tract infection.  - Patient is alert and oriented this morning. No acute findings on CT head.  - Order placed for home health physical therapy per physical therapy evaluation.   Microcytic anemia / iron deficiency anemia - Continue iron supplements.  COPD (chronic obstructive pulmonary disease) - Respiratory status stable. Continue Spiriva daily and  theophylline twice daily - continue albuterol rescue inhaler as needed.   Severe protein-calorie malnutrition - Advised on nutrition. Nutrition consulted. - Diet as tolerated.  Weakness generalized - HHPT orthopedic place.   Essential hypertension - Continue lisinopril  Hypothyroidism - Continue Synthroid  Diabetes mellitus, controlled - May continue metformin daily  Dyslipidemia - Continue statin therapy  Depression - Continue Elavil     DVT Prophylaxis  - SCD's bilaterally while patient is in hospital.    Code Status: Full.  Family Communication: plan of care discussed with the patient  IV access:  Peripheral IV  Procedures and diagnostic studies:   Dg Chest 1 View 02/03/2015 1. No active disease. 2. Emphysema and scarring related to right lobectomy. Electronically Signed By: Monte Fantasia M.D. On: 02/03/2015 23:00   Ct Head Wo Contrast 02/03/2015 1. No acute intracranial pathology seen on CT. 2. Left ventriculoperitoneal shunt noted in expected position. No evidence of hydrocephalus at this time. 3. 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 4. Postoperative change seen bilaterally. Electronically Signed By: Garald Balding M.D. On: 02/03/2015 23:00    Medical Consultants:  None   Other Consultants:  Physical therapy Nutrition  IAnti-Infectives:   Rocephin 02/03/2015 --> 02/06/2015  Signed:  Leisa Lenz, MD  [facility] 02/06/2015, 10:02 AM  Pager #: [phone]   Discharge Exam: Filed Vitals:   02/06/15 0627  BP: 152/87  Pulse: 77  Temp: 98.5 F (36.9 C)  Resp: 80   Filed Vitals:   02/05/15 1558 02/05/15 2147 02/05/15 2235 02/06/15 0627  BP: 138/87 138/81 137/78 152/87  Pulse: 87  74 77  Temp: 98.2 F (36.8 C)  98.6 F (  37 C) 98.5 F (36.9 C)  TempSrc: Oral  Oral Oral  Resp: 20  20 80  Height:      Weight:      SpO2: 100%   100% 98%    General: Pt is alert, follows commands appropriately, not in acute distress Cardiovascular: Regular rate and rhythm, S1/S2 +, no murmurs Respiratory: Clear to auscultation bilaterally, no wheezing, no crackles, no rhonchi Abdominal: Soft, non tender, non distended, bowel sounds +, no guarding Extremities: no edema, no cyanosis, pulses palpable bilaterally DP and PT Neuro: Grossly nonfocal  Discharge Instructions  Discharge Instructions    Call MD for:  difficulty breathing, headache or visual disturbances    Complete by:  As directed      Call MD for:  persistant nausea and vomiting    Complete by:  As directed      Call MD for:  redness, tenderness, or signs of infection (pain, swelling, redness, odor or green/yellow discharge around incision site)    Complete by:  As directed      Call MD for:  severe uncontrolled pain    Complete by:  As directed      Diet - low sodium heart healthy    Complete by:  As directed      Discharge instructions    Complete by:  As directed   You were hospitalized for urinary tract infection. You have received total of 3 days of IV antibiotics in hospital. No need for antibiotics on discharge.     Increase activity slowly    Complete by:  As directed             Medication List    TAKE these medications        acetaminophen 500 MG tablet  Commonly known as:  TYLENOL  Take 500-1,000 mg by mouth every 4 (four) hours as needed for headache. For pain     albuterol (2.5 MG/3ML) 0.083% nebulizer solution  Commonly known as:  PROVENTIL  Take 2.5 mg by nebulization 2 (two) times daily.     albuterol 108 (90 BASE) MCG/ACT inhaler  Commonly known as:  PROVENTIL HFA;VENTOLIN HFA  Inhale 2 puffs into the lungs 2 (two) times daily as needed.     amitriptyline 25 MG tablet  Commonly known as:  ELAVIL  Take 50 mg by mouth at bedtime.     atorvastatin 20 MG tablet  Commonly known as:  LIPITOR  Take 20 mg by mouth at bedtime.      busPIRone 10 MG tablet  Commonly known as:  BUSPAR  Take 10 mg by mouth 3 (three) times daily as needed. For anxiety     CALCIUM-MAGNESIUM-ZINC PO  Take 1 tablet by mouth every morning.     cholecalciferol 1000 UNITS tablet  Commonly known as:  VITAMIN D  Take 5,000 Units by mouth every morning.     ferrous sulfate 325 (65 FE) MG tablet  Take 325 mg by mouth 3 (three) times daily with meals.     fluticasone 50 MCG/ACT nasal spray  Commonly known as:  FLONASE  Place 2 sprays into the nose every morning.     furosemide 20 MG tablet  Commonly known as:  LASIX  Take 20 mg by mouth daily as needed for fluid.     levothyroxine 100 MCG tablet  Commonly known as:  SYNTHROID, LEVOTHROID  Take 100 mcg by mouth daily.     lisinopril 20 MG tablet  Commonly known as:  PRINIVIL,ZESTRIL  Take 10 mg by mouth at bedtime.     metFORMIN 500 MG tablet  Commonly known as:  GLUCOPHAGE  Take 500 mg by mouth daily.     omeprazole 40 MG capsule  Commonly known as:  PRILOSEC  Take 1 capsule (40 mg total) by mouth 2 (two) times daily.     acidophilus Caps capsule  Take 1 capsule by mouth daily.     PHILLIPS COLON HEALTH Caps  Take 1 capsule by mouth daily.     potassium gluconate 595 MG Tabs tablet  Take 595 mg by mouth daily.     ranitidine 150 MG tablet  Commonly known as:  ZANTAC  Take 150 mg by mouth daily as needed for heartburn.     theophylline 300 MG 24 hr capsule  Commonly known as:  THEO-24  Take 300 mg by mouth 2 (two) times daily.     tiotropium 18 MCG inhalation capsule  Commonly known as:  SPIRIVA  Place 18 mcg into inhaler and inhale daily.           Follow-up Information    Follow up with Dr.Browning. Go on 02/13/2015.   Contact information:   [place]                [phone]                                           March 14th at 1030am be there at 1015       The results of significant diagnostics from this hospitalization  (including imaging, microbiology, ancillary and laboratory) are listed below for reference.    Significant Diagnostic Studies: Dg Chest 1 View  02/03/2015   CLINICAL DATA:  Weakness and fatigue for 3 days.  Cough.  EXAM: CHEST  1 VIEW  COMPARISON:  02/14/2013  FINDINGS: Pulmonary hyperinflation and emphysematous change. There is no edema, consolidation, effusion, or pneumothorax. Unchanged sequela of right lung surgery for lung cancer (likely upper lobectomy) with chronic distortion of right upper ribs, distorted right hilum, and right-sided volume loss. Normal heart size and stable aortic contours.  There is a extensive dystrophic calcification of the ventriculoperitoneal shunt catheter as it traverses the left chest. The catheter is difficult to visualize at the level of the abdomen.  IMPRESSION: 1.  No active disease. 2. Emphysema and scarring related to right lobectomy.   Electronically Signed   By: Monte Fantasia M.D.   On: 02/03/2015 23:00   Ct Head Wo Contrast  02/03/2015   CLINICAL DATA:  Paralyzed weakness and decreased oral intake for 2 days. Initial encounter.  EXAM: CT HEAD WITHOUT CONTRAST  TECHNIQUE: Contiguous axial images were obtained from the base of the skull through the vertex without intravenous contrast.  COMPARISON:  None.  FINDINGS: There is no evidence of acute infarction, mass lesion, or intra- or extra-axial hemorrhage on CT.  A left-sided ventriculoperitoneal shunt is seen extending into the left lateral ventricle. There is no evidence of hydrocephalus. 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  The posterior fossa, including the cerebellum, brainstem and fourth ventricle, is within normal limits. The third and lateral ventricles, and basal ganglia are unremarkable in appearance. No mass effect or midline shift is seen.  There is no evidence of fracture; bilateral partially healed  craniotomy flaps  are seen. 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 The orbits are within normal limits. The paranasal sinuses and mastoid air cells are well-aerated. No significant soft tissue abnormalities are seen.  IMPRESSION: 1. No acute intracranial pathology seen on CT. 2. Left ventriculoperitoneal shunt noted in expected position. No evidence of hydrocephalus at this time. 3. 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 4. Postoperative change seen bilaterally.   Electronically Signed   By: Garald Balding M.D.   On: 02/03/2015 23:00    Microbiology: No results found for this or any previous visit (from the past 240 hour(s)).   Labs: Basic Metabolic Panel:  Recent Labs Lab 02/03/15 2111 02/04/15 0640  NA 136 137  K 4.3 3.6  CL 99 103  CO2 29 28  GLUCOSE 145* 116*  BUN 24* 16  CREATININE 0.84 0.60  CALCIUM 9.4 8.5   Liver Function Tests:  Recent Labs Lab 02/03/15 2111  AST 10  ALT 9  ALKPHOS 94  BILITOT 0.3  PROT 7.5  ALBUMIN 3.6   No results for input(s): LIPASE, AMYLASE in the last 168 hours. No results for input(s): AMMONIA in the last 168 hours. CBC:  Recent Labs Lab 02/03/15 2111 02/04/15 0640  WBC 5.4 5.0  NEUTROABS 3.8  --   HGB 11.9* 10.6*  HCT 35.8* 32.1*  MCV 87.3 88.2  PLT 244 283   Cardiac Enzymes:  Recent Labs Lab 02/03/15 2111  TROPONINI <0.03   BNP: BNP (last 3 results) No results for input(s): BNP in the last 8760 hours.  ProBNP (last 3 results) No results for input(s): PROBNP in the last 8760 hours.  CBG: No results for input(s): GLUCAP in the last 168 hours.  Time coordinating discharge: Over 30 minutes

## 2015-02-06 NOTE — Evaluation (Signed)
Physical Therapy Evaluation Patient Details Name: Tracey Martinez MRN: [id number] DOB: [birth year] Today's Date: 02/06/2015   History of Present Illness  69 yo female multiple medical problems brought in by family members for generalized weakness worse than normal, and confusion. Has not been eating and drinking lately. Pt denies any fevers. No pain. She says her family has been having "a hard time understanding her". She is alert and oriented. Denies any n/v/d. No swelling anywhere. No dysuria. No suprapubic pain. Does say she is weaker than normal, just hasnt been feeling right. No focal neurological symptoms. Found to have a uti and mildly orthostatic. Family Not present.  Pt lives with her husband and son, ambulates with a walker at baseline.  She reports several falls at home recently "I lose my balance".  This occurs when using the walker.  Clinical Impression  Pt is found to be alert, oriented and cooperative although her responses are rather slow and she had some difficulty following simple instructions.  She was found to be deconditioned with mild balance disturbance.  She is frail in appearance with poor muscle bulk.  She has a severe thoracic kyphosis.  I am recommending HHPT  At d/c and pt is agreeable.    Follow Up Recommendations Home health PT    Equipment Recommendations  None recommended by PT    Recommendations for Other Services    none   Precautions / Restrictions Precautions Precautions: Fall Restrictions Weight Bearing Restrictions: No      Mobility  Bed Mobility Overal bed mobility: Independent                Transfers Overall transfer level: Modified independent Equipment used: Rolling walker (2 wheeled)                Ambulation/Gait Ambulation/Gait assistance: Supervision Ambulation Distance (Feet): 250 Feet Assistive device: Rolling walker (2 wheeled) Gait Pattern/deviations: WFL(Within Functional Limits)   Gait velocity  interpretation: Below normal speed for age/gender General Gait Details: pt does seem to lose her focus at times when amulating and then slows down significantly  Stairs            Wheelchair Mobility    Modified Rankin (Stroke Patients Only)       Balance Overall balance assessment: History of Falls                                           Pertinent Vitals/Pain Pain Assessment: No/denies pain    Home Living Family/patient expects to be discharged to:: Private residence Living Arrangements: Spouse/significant other;Children Available Help at Discharge: Family Type of Home: House Home Access: Stairs to enter Entrance Stairs-Rails: Right Entrance Stairs-Number of Steps: 6 Home Layout: One level Home Equipment: Environmental consultant - 2 wheels      Prior Function Level of Independence: Independent with assistive device(s)               Hand Dominance        Extremity/Trunk Assessment               Lower Extremity Assessment: Generalized weakness      Cervical / Trunk Assessment: Kyphotic (kyphosis is severe)  Communication   Communication: No difficulties  Cognition Arousal/Alertness: Awake/alert Behavior During Therapy: WFL for tasks assessed/performed Overall Cognitive Status: Within Functional Limits for tasks assessed  General Comments      Exercises        Assessment/Plan    PT Assessment All further PT needs can be met in the next venue of care  PT Diagnosis Generalized weakness   PT Problem List Decreased strength;Decreased activity tolerance;Decreased balance  PT Treatment Interventions     PT Goals (Current goals can be found in the Care Plan section) Acute Rehab PT Goals PT Goal Formulation: All assessment and education complete, DC therapy    Frequency     Barriers to discharge        Co-evaluation               End of Session Equipment Utilized During Treatment: Gait  belt Activity Tolerance: Patient tolerated treatment well Patient left: in chair;with call bell/phone within reach Nurse Communication: Mobility status         Time: 9733-1250 PT Time Calculation (min) (ACUTE ONLY): 26 min   Charges:   PT Evaluation $Initial PT Evaluation Tier I: 1 Procedure     PT G CodesDemetrios Isaacs L 02/06/2015, 9:16 AM

## 2015-02-06 NOTE — Discharge Instructions (Signed)

## 2015-02-07 LAB — URINE CULTURE: Colony Count: >=100000

## 2015-06-24 IMAGING — CT CT HEAD W/O CM
2 series · 15 of 30 positions shown, 19 images · non-contrast
Comparison: None.

CLINICAL DATA: Paralyzed weakness and decreased oral intake for 2
days. Initial encounter.

EXAM:
CT HEAD WITHOUT CONTRAST
TECHNIQUE: Contiguous axial images were obtained from the base of the skull
through the vertex without intravenous contrast.

[Series 2: headseq 4.8 h37s · axial · 0.43mm/px · z∈[+142,+275]mm · 13 of 34 slices shown, 17 images]
[im 3/34  brain]
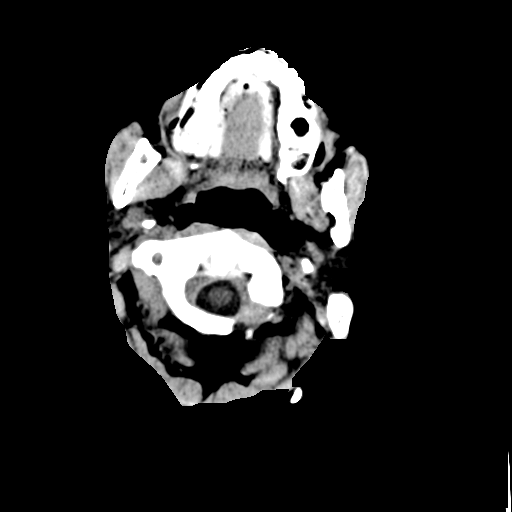
[im 3/34  bone]
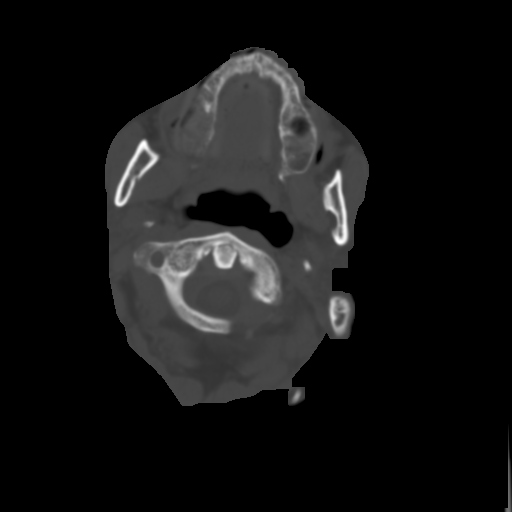
[im 5/34  brain]
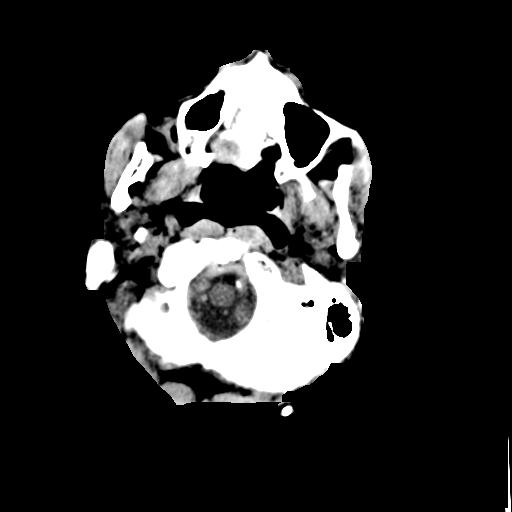
[im 8/34  brain]
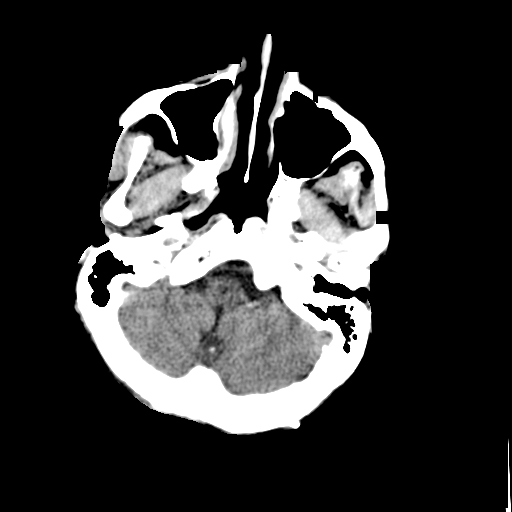
[im 10/34  brain]
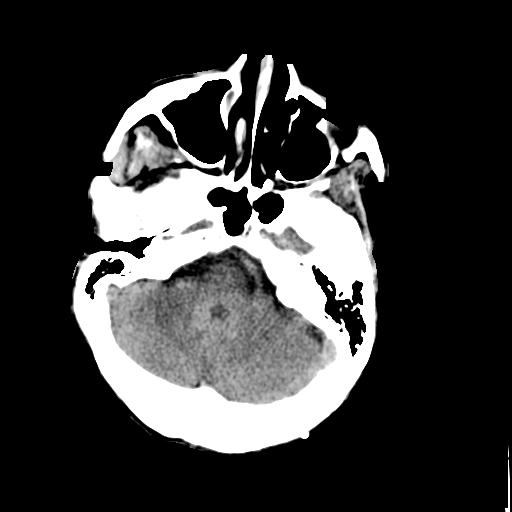
[im 12/34  brain]
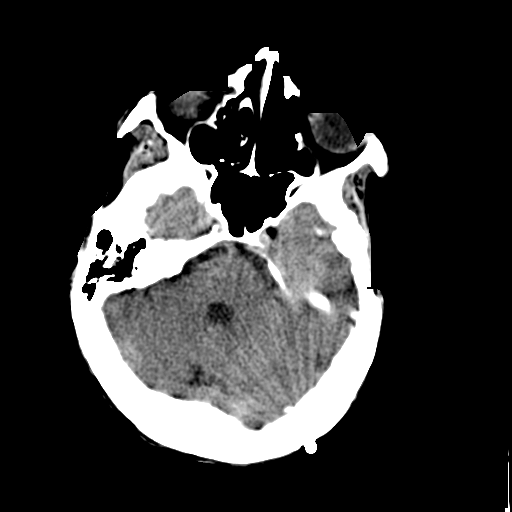
[im 12/34  bone]
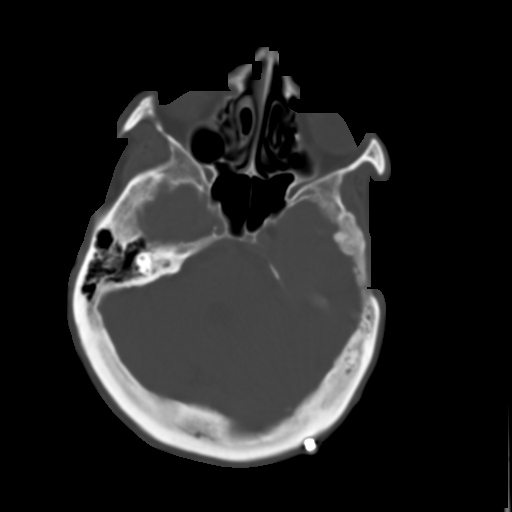
[im 15/34  brain]
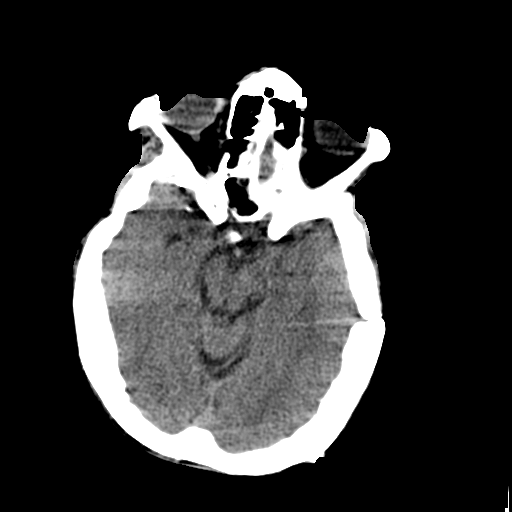
[im 17/34  brain]
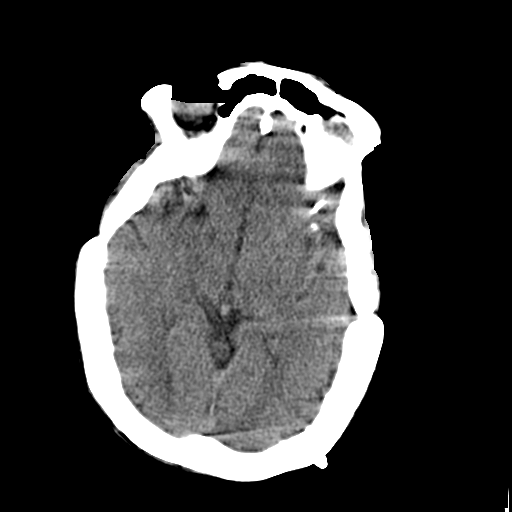
[im 19/34  brain]
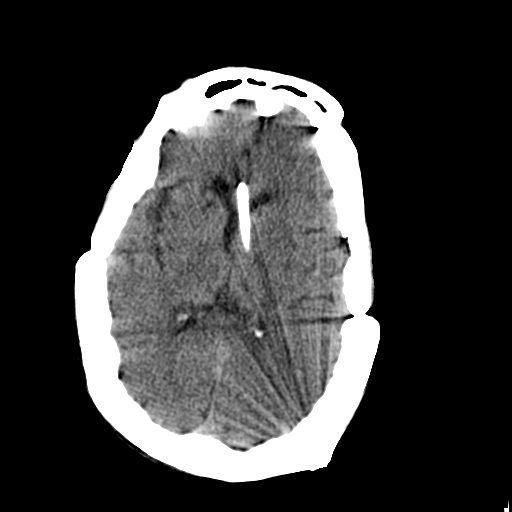
[im 22/34  brain]
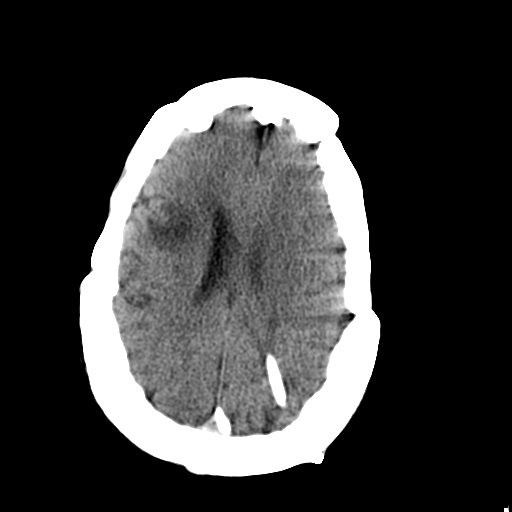
[im 22/34  bone]
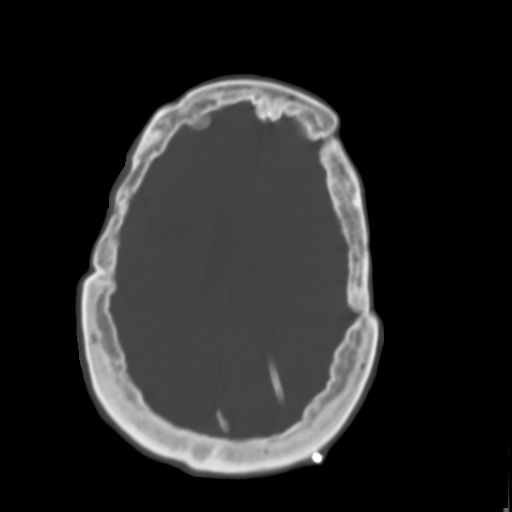
[im 24/34  brain]
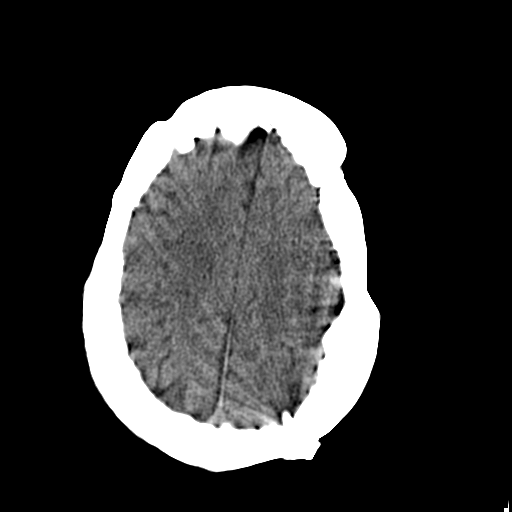
[im 26/34  brain]
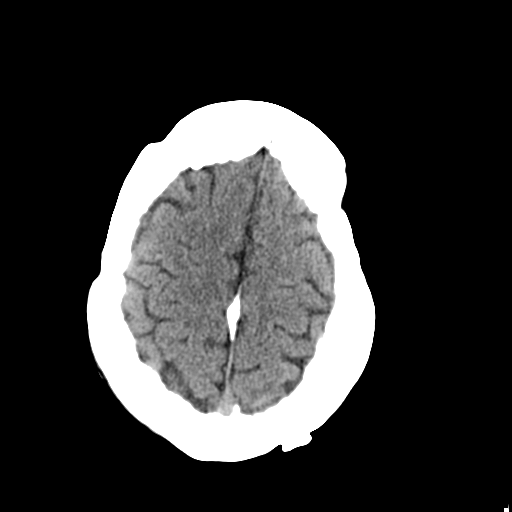
[im 29/34  brain]
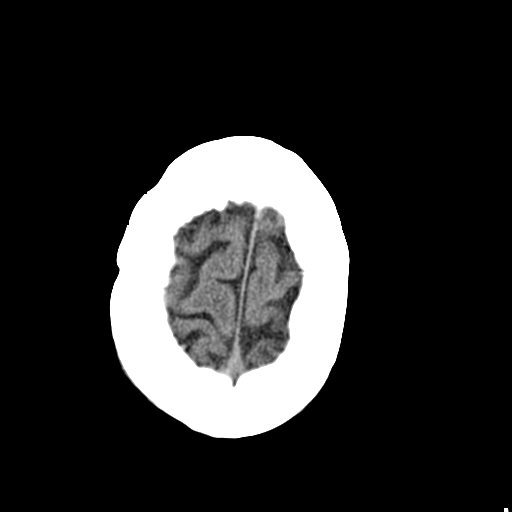
[im 31/34  brain]
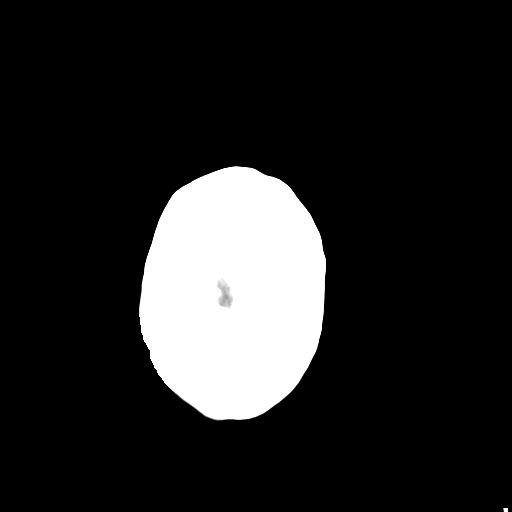
[im 31/34  bone]
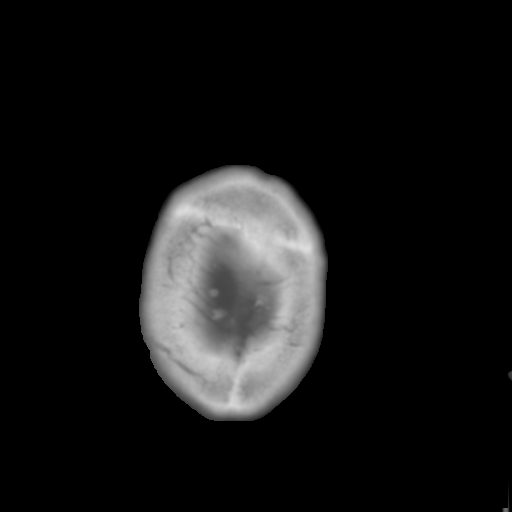

[Series 3: headseq 4.8 h60s · axial · 0.43mm/px · z∈[+142,+165]mm · 2 of 36 slices shown]
[im 3/36  brain]
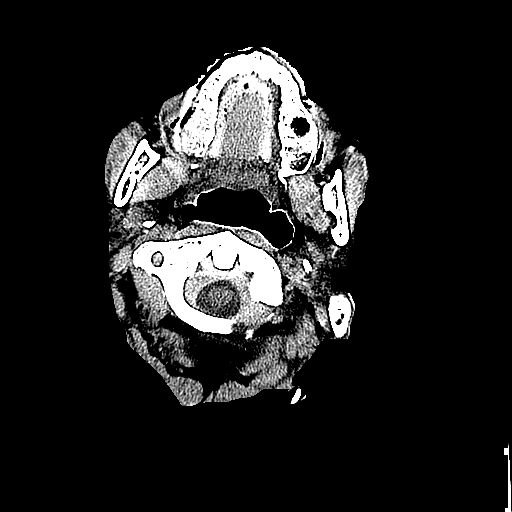
[im 8/36  brain]
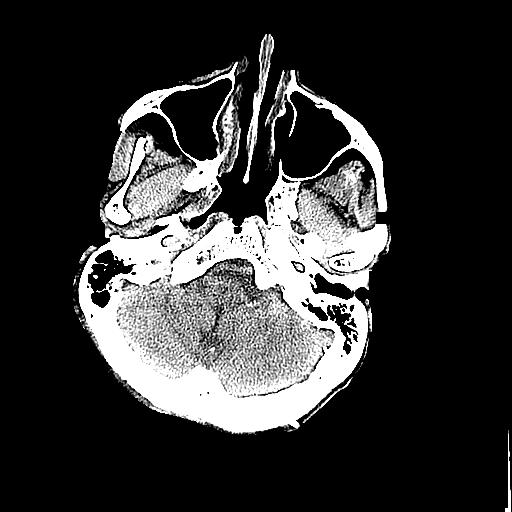

[15 of 30 positions shown; findings below may reference images not displayed]

FINDINGS: There is no evidence of acute infarction, mass lesion, or intra- or
extra-axial hemorrhage on CT.

A left-sided ventriculoperitoneal shunt is seen extending into the
left lateral ventricle. There is no evidence of hydrocephalus. A
small likely chronic infarct is seen at the right parietal lobe and
adjacent basal ganglia, with associated encephalomalacia.

The posterior fossa, including the cerebellum, brainstem and fourth
ventricle, is within normal limits. The third and lateral
ventricles, and basal ganglia are unremarkable in appearance. No
mass effect or midline shift is seen.

There is no evidence of fracture; bilateral partially healed
craniotomy flaps are seen. The patient is status post aneurysm
clipping at the middle cerebral arteries bilaterally. The orbits are
within normal limits. The paranasal sinuses and mastoid air cells
are well-aerated. No significant soft tissue abnormalities are seen.
IMPRESSION: 1. No acute intracranial pathology seen on CT.
2. Left ventriculoperitoneal shunt noted in expected position. No
evidence of hydrocephalus at this time.
3. Small likely chronic infarct at the right parietal lobe and
adjacent basal ganglia, with associated encephalomalacia.
4. Postoperative change seen bilaterally.

## 2015-06-24 IMAGING — DX DG CHEST 1V
1 series · 1 of 1 positions shown · non-contrast
Comparison: 02/14/2013

CLINICAL DATA: Weakness and fatigue for 3 days.  Cough.

EXAM:
CHEST  1 VIEW

[chest ap]
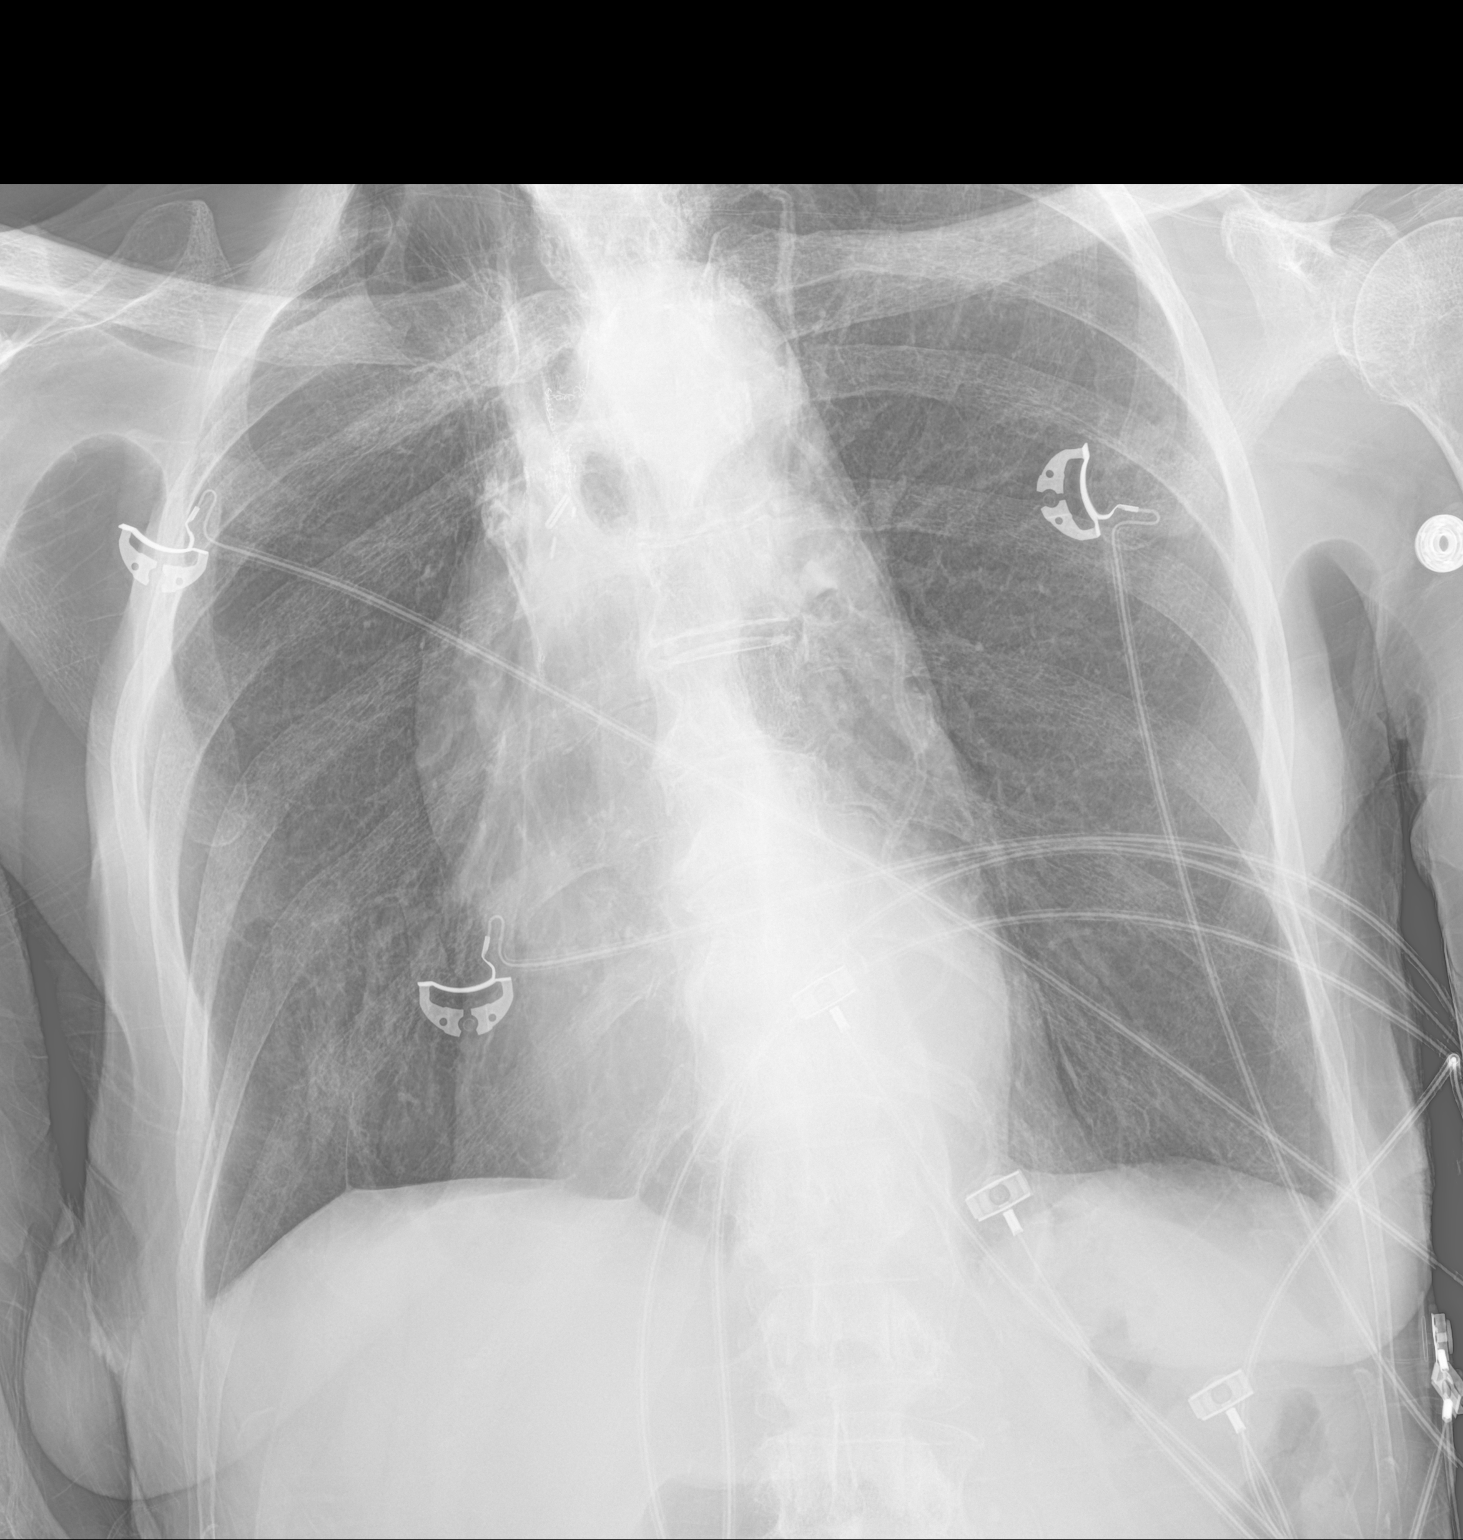

[1 of 1 positions shown; findings below may reference images not displayed]

FINDINGS: Pulmonary hyperinflation and emphysematous change. There is no
edema, consolidation, effusion, or pneumothorax. Unchanged sequela
of right lung surgery for lung cancer (likely upper lobectomy) with
chronic distortion of right upper ribs, distorted right hilum, and
right-sided volume loss. Normal heart size and stable aortic
contours.

There is a extensive dystrophic calcification of the
ventriculoperitoneal shunt catheter as it traverses the left chest.
The catheter is difficult to visualize at the level of the abdomen.
IMPRESSION: 1.  No active disease.
2. Emphysema and scarring related to right lobectomy.

## 2015-07-06 ENCOUNTER — Observation Stay (HOSPITAL_COMMUNITY)
Admission: EM | Admit: 2015-07-06 | Discharge: 2015-07-07 | Disposition: A | Discharge status: Home / Self Care | Payer: Medicare HMO | Attending: Internal Medicine | Admitting: Internal Medicine

## 2015-07-06 ENCOUNTER — Encounter (HOSPITAL_COMMUNITY): Payer: Self-pay

## 2015-07-06 ENCOUNTER — Emergency Department (HOSPITAL_COMMUNITY): Payer: Medicare HMO

## 2015-07-06 DIAGNOSIS — Z85118 Personal history of other malignant neoplasm of bronchus and lung: Secondary | ICD-10-CM | POA: Insufficient documentation

## 2015-07-06 DIAGNOSIS — I959 Hypotension, unspecified: Principal | ICD-10-CM | POA: Insufficient documentation

## 2015-07-06 DIAGNOSIS — Z720 Tobacco use: Secondary | ICD-10-CM | POA: Diagnosis present

## 2015-07-06 DIAGNOSIS — F419 Anxiety disorder, unspecified: Secondary | ICD-10-CM | POA: Insufficient documentation

## 2015-07-06 DIAGNOSIS — N179 Acute kidney failure, unspecified: Secondary | ICD-10-CM | POA: Insufficient documentation

## 2015-07-06 DIAGNOSIS — D649 Anemia, unspecified: Secondary | ICD-10-CM | POA: Diagnosis not present

## 2015-07-06 DIAGNOSIS — Z9981 Dependence on supplemental oxygen: Secondary | ICD-10-CM | POA: Insufficient documentation

## 2015-07-06 DIAGNOSIS — Z7951 Long term (current) use of inhaled steroids: Secondary | ICD-10-CM | POA: Insufficient documentation

## 2015-07-06 DIAGNOSIS — K259 Gastric ulcer, unspecified as acute or chronic, without hemorrhage or perforation: Secondary | ICD-10-CM | POA: Diagnosis not present

## 2015-07-06 DIAGNOSIS — E871 Hypo-osmolality and hyponatremia: Secondary | ICD-10-CM | POA: Diagnosis present

## 2015-07-06 DIAGNOSIS — K219 Gastro-esophageal reflux disease without esophagitis: Secondary | ICD-10-CM | POA: Insufficient documentation

## 2015-07-06 DIAGNOSIS — C3490 Malignant neoplasm of unspecified part of unspecified bronchus or lung: Secondary | ICD-10-CM | POA: Diagnosis present

## 2015-07-06 DIAGNOSIS — Z79899 Other long term (current) drug therapy: Secondary | ICD-10-CM | POA: Diagnosis not present

## 2015-07-06 DIAGNOSIS — R627 Adult failure to thrive: Secondary | ICD-10-CM | POA: Diagnosis present

## 2015-07-06 DIAGNOSIS — R5383 Other fatigue: Secondary | ICD-10-CM | POA: Diagnosis present

## 2015-07-06 DIAGNOSIS — I729 Aneurysm of unspecified site: Secondary | ICD-10-CM | POA: Diagnosis not present

## 2015-07-06 DIAGNOSIS — R739 Hyperglycemia, unspecified: Secondary | ICD-10-CM | POA: Diagnosis present

## 2015-07-06 DIAGNOSIS — E039 Hypothyroidism, unspecified: Secondary | ICD-10-CM | POA: Diagnosis not present

## 2015-07-06 DIAGNOSIS — I10 Essential (primary) hypertension: Secondary | ICD-10-CM | POA: Insufficient documentation

## 2015-07-06 DIAGNOSIS — N390 Urinary tract infection, site not specified: Secondary | ICD-10-CM | POA: Diagnosis not present

## 2015-07-06 DIAGNOSIS — E43 Unspecified severe protein-calorie malnutrition: Secondary | ICD-10-CM | POA: Diagnosis present

## 2015-07-06 DIAGNOSIS — J449 Chronic obstructive pulmonary disease, unspecified: Secondary | ICD-10-CM | POA: Insufficient documentation

## 2015-07-06 DIAGNOSIS — R531 Weakness: Secondary | ICD-10-CM

## 2015-07-06 DIAGNOSIS — J9610 Chronic respiratory failure, unspecified whether with hypoxia or hypercapnia: Secondary | ICD-10-CM

## 2015-07-06 LAB — GLUCOSE, CAPILLARY: GLUCOSE-CAPILLARY: 137 mg/dL — AB (ref 65–99)

## 2015-07-06 LAB — CBC WITH DIFFERENTIAL/PLATELET
BASOS ABS: 0 10*3/uL (ref 0.0–0.1)
Basophils Relative: 0 % (ref 0–1)
EOS ABS: 0 10*3/uL (ref 0.0–0.7)
EOS PCT: 0 % (ref 0–5)
HCT: 39.6 % (ref 36.0–46.0)
Hemoglobin: 13.6 g/dL (ref 12.0–15.0)
LYMPHS PCT: 22 % (ref 12–46)
Lymphs Abs: 1.2 10*3/uL (ref 0.7–4.0)
MCH: 30 pg (ref 26.0–34.0)
MCHC: 34.3 g/dL (ref 30.0–36.0)
MCV: 87.2 fL (ref 78.0–100.0)
MONO ABS: 0.2 10*3/uL (ref 0.1–1.0)
Monocytes Relative: 5 % (ref 3–12)
Neutro Abs: 3.8 10*3/uL (ref 1.7–7.7)
Neutrophils Relative %: 73 % (ref 43–77)
PLATELETS: 325 10*3/uL (ref 150–400)
RBC: 4.54 MIL/uL (ref 3.87–5.11)
RDW: 16.2 % — ABNORMAL HIGH (ref 11.5–15.5)
WBC: 5.3 10*3/uL (ref 4.0–10.5)

## 2015-07-06 LAB — URINALYSIS, ROUTINE W REFLEX MICROSCOPIC
BILIRUBIN URINE: NEGATIVE
Glucose, UA: NEGATIVE mg/dL
KETONES UR: NEGATIVE mg/dL
NITRITE: NEGATIVE
PH: 7 (ref 5.0–8.0)
SPECIFIC GRAVITY, URINE: 1.02 (ref 1.005–1.030)
Urobilinogen, UA: 0.2 mg/dL (ref 0.0–1.0)

## 2015-07-06 LAB — BASIC METABOLIC PANEL
Anion gap: 10 (ref 5–15)
BUN: 15 mg/dL (ref 6–20)
CALCIUM: 10.2 mg/dL (ref 8.9–10.3)
CO2: 29 mmol/L (ref 22–32)
CREATININE: 1.03 mg/dL — AB (ref 0.44–1.00)
Chloride: 93 mmol/L — ABNORMAL LOW (ref 101–111)
GFR calc non Af Amer: 54 mL/min — ABNORMAL LOW (ref >60)
Glucose, Bld: 174 mg/dL — ABNORMAL HIGH (ref 65–99)
Potassium: 3.8 mmol/L (ref 3.5–5.1)
Sodium: 132 mmol/L — ABNORMAL LOW (ref 135–145)

## 2015-07-06 LAB — TSH: TSH: 36.928 u[IU]/mL — AB (ref 0.350–4.500)

## 2015-07-06 LAB — LACTIC ACID, PLASMA
LACTIC ACID, VENOUS: 1.9 mmol/L (ref 0.5–2.0)
Lactic Acid, Venous: 3.6 mmol/L (ref 0.5–2.0)

## 2015-07-06 LAB — URINE MICROSCOPIC-ADD ON

## 2015-07-06 LAB — TROPONIN I
Troponin I: <0.03 ng/mL (ref <0.031)
Troponin I: <0.03 ng/mL (ref <0.031)

## 2015-07-06 MED ORDER — ACETAMINOPHEN 325 MG PO TABS: 650.0000 mg | ORAL_TABLET | Freq: Four times a day (QID) | ORAL | Status: DC | PRN

## 2015-07-06 MED ORDER — LEVOTHYROXINE SODIUM 100 MCG PO TABS
100.0000 ug | ORAL_TABLET | Freq: Every day | ORAL | Status: DC
  Administered 2015-07-07: 100 ug via ORAL
  Filled 2015-07-06: qty 1

## 2015-07-06 MED ORDER — INSULIN ASPART 100 UNIT/ML ~~LOC~~ SOLN: 0.0000 [IU] | Freq: Every day | SUBCUTANEOUS | Status: DC

## 2015-07-06 MED ORDER — FAMOTIDINE 20 MG PO TABS
10.0000 mg | ORAL_TABLET | Freq: Every day | ORAL | Status: DC
  Filled 2015-07-06: qty 1

## 2015-07-06 MED ORDER — FAMOTIDINE 20 MG PO TABS
20.0000 mg | ORAL_TABLET | Freq: Every day | ORAL | Status: DC
  Administered 2015-07-06 – 2015-07-07 (×2): 20 mg via ORAL
  Filled 2015-07-06: qty 1

## 2015-07-06 MED ORDER — SODIUM CHLORIDE 0.9 % IV BOLUS (SEPSIS)
500.0000 mL | Freq: Once | INTRAVENOUS | Status: AC
  Administered 2015-07-06: 500 mL via INTRAVENOUS

## 2015-07-06 MED ORDER — ALBUTEROL SULFATE (2.5 MG/3ML) 0.083% IN NEBU: 3.0000 mL | INHALATION_SOLUTION | Freq: Two times a day (BID) | RESPIRATORY_TRACT | Status: DC | PRN

## 2015-07-06 MED ORDER — ALBUTEROL SULFATE HFA 108 (90 BASE) MCG/ACT IN AERS
2.0000 | INHALATION_SPRAY | Freq: Two times a day (BID) | RESPIRATORY_TRACT | Status: DC | PRN
  Filled 2015-07-06: qty 6.7

## 2015-07-06 MED ORDER — FLUTICASONE PROPIONATE 50 MCG/ACT NA SUSP
2.0000 | Freq: Every day | NASAL | Status: DC
  Administered 2015-07-06 – 2015-07-07 (×2): 2 via NASAL
  Filled 2015-07-06 (×2): qty 16

## 2015-07-06 MED ORDER — ENSURE ENLIVE PO LIQD
237.0000 mL | Freq: Two times a day (BID) | ORAL | Status: DC
  Administered 2015-07-07 (×2): 237 mL via ORAL

## 2015-07-06 MED ORDER — AMITRIPTYLINE HCL 25 MG PO TABS
50.0000 mg | ORAL_TABLET | Freq: Every day | ORAL | Status: DC
  Administered 2015-07-06: 50 mg via ORAL
  Filled 2015-07-06: qty 2

## 2015-07-06 MED ORDER — ONDANSETRON HCL 4 MG/2ML IJ SOLN
4.0000 mg | Freq: Four times a day (QID) | INTRAMUSCULAR | Status: DC | PRN
Start: 2015-07-06 — End: 2015-07-07

## 2015-07-06 MED ORDER — ENOXAPARIN SODIUM 40 MG/0.4ML ~~LOC~~ SOLN
40.0000 mg | SUBCUTANEOUS | Status: DC
  Filled 2015-07-06: qty 0.4

## 2015-07-06 MED ORDER — INSULIN ASPART 100 UNIT/ML ~~LOC~~ SOLN
0.0000 [IU] | Freq: Three times a day (TID) | SUBCUTANEOUS | Status: DC
  Administered 2015-07-07: 1 [IU] via SUBCUTANEOUS

## 2015-07-06 MED ORDER — SODIUM CHLORIDE 0.9 % IV SOLN
INTRAVENOUS | Status: DC
  Administered 2015-07-06 – 2015-07-07 (×2): via INTRAVENOUS

## 2015-07-06 MED ORDER — ACETAMINOPHEN 650 MG RE SUPP: 650.0000 mg | Freq: Four times a day (QID) | RECTAL | Status: DC | PRN

## 2015-07-06 MED ORDER — ONDANSETRON HCL 4 MG PO TABS: 4.0000 mg | ORAL_TABLET | Freq: Four times a day (QID) | ORAL | Status: DC | PRN

## 2015-07-06 MED ORDER — DEXTROSE 5 % IV SOLN
1.0000 g | Freq: Once | INTRAVENOUS | Status: AC
  Administered 2015-07-06: 1 g via INTRAVENOUS
  Filled 2015-07-06: qty 10

## 2015-07-06 MED ORDER — HYDROCODONE-ACETAMINOPHEN 5-325 MG PO TABS: 1.0000 | ORAL_TABLET | Freq: Four times a day (QID) | ORAL | Status: DC | PRN

## 2015-07-06 MED ORDER — TIOTROPIUM BROMIDE MONOHYDRATE 18 MCG IN CAPS
18.0000 ug | ORAL_CAPSULE | Freq: Every day | RESPIRATORY_TRACT | Status: DC
  Administered 2015-07-07: 18 ug via RESPIRATORY_TRACT
  Filled 2015-07-06: qty 5

## 2015-07-06 MED ORDER — ENOXAPARIN SODIUM 30 MG/0.3ML ~~LOC~~ SOLN
30.0000 mg | SUBCUTANEOUS | Status: DC
  Administered 2015-07-06: 30 mg via SUBCUTANEOUS
  Filled 2015-07-06: qty 0.3

## 2015-07-06 MED ORDER — ALUM & MAG HYDROXIDE-SIMETH 200-200-20 MG/5ML PO SUSP: 30.0000 mL | Freq: Four times a day (QID) | ORAL | Status: DC | PRN

## 2015-07-06 MED ORDER — PANTOPRAZOLE SODIUM 40 MG PO TBEC
80.0000 mg | DELAYED_RELEASE_TABLET | Freq: Every day | ORAL | Status: DC
  Administered 2015-07-07: 80 mg via ORAL
  Filled 2015-07-06: qty 2

## 2015-07-06 MED ORDER — PANTOPRAZOLE SODIUM 40 MG PO TBEC
40.0000 mg | DELAYED_RELEASE_TABLET | Freq: Every day | ORAL | Status: DC
  Filled 2015-07-06: qty 1

## 2015-07-06 MED ORDER — LEVOTHYROXINE SODIUM 100 MCG PO TABS
100.0000 ug | ORAL_TABLET | Freq: Every day | ORAL | Status: DC
  Filled 2015-07-06: qty 1

## 2015-07-06 MED ORDER — ALBUTEROL SULFATE (2.5 MG/3ML) 0.083% IN NEBU
2.5000 mg | INHALATION_SOLUTION | Freq: Two times a day (BID) | RESPIRATORY_TRACT | Status: DC
  Administered 2015-07-06 – 2015-07-07 (×2): 2.5 mg via RESPIRATORY_TRACT
  Filled 2015-07-06 (×2): qty 3

## 2015-07-06 NOTE — ED Notes (Signed)
Patient was able to stand with assistance.  Patient stated that she walked short distances with a walker and that her "balance had been off forever".

## 2015-07-06 NOTE — ED Notes (Signed)
Pt here for generalized weakness that has been going on for a while. Per EMS, pt's husband states weakness is worse today

## 2015-07-06 NOTE — H&P (Signed)
[facility] History and Physical  Tracey Martinez ZDG:[id number] DOB: 01/19/1946 DOA: 07/06/2015  Referring physician: Thurnell Garbe PCP: Erma Pinto, FNP   Chief Complaint: generalized weakness  HPI: Tracey Martinez is a 69 y.o. female with a past medical history that includes chronic respiratory failure on home oxygen related to COPD and history of lung cancer, hypertension, hypothyroid, severe protein calorie malnutrition, resents to the emergency department with chief complaint of worsening generalized weakness. She'll evaluation in the emergency department reveals a urinary tract infection.  Patient could not recollect circumstances that brought her to the emergency department but she was oriented to person place and time. According to family member at the bedside and ED staff EMS was called to the home for gradual onset persistent weakness. Family reports it's been going on "for very long time" patient reported sitting on the toilet and not being able to get up. She denied chest pain palpitation shortness of breath headache dizziness syncope or near-syncope. She denied any abdominal pain nausea vomiting diarrhea constipation. She denies numbness tingling of her extremities. She denies lower extremity edema or orthopnea.  Workup in the emergency department includes a chest x-ray with no evidence of acute airspace disease stable emphysema, mild hyponatremia creatinine 1.03 initial troponin 0.03 lactic acid 3.6. Complete blood count is unremarkable, glucose is 174. Analysis consistent with a UTI. EKG with sinus rhythm nonspecific T-wave and prolonged QT. The emergency department she is hemodynamically stable afebrile and not hypoxic. She is provided with 500 mL bolus of normal saline and 1 g of Rocephin in the emergency department.   Review of Systems:  10 point review of systems complete and all systems are negative except as indicated in the history of present illness  Past Medical History   Diagnosis Date  . Aneurysm     brain  . GERD (gastroesophageal reflux disease)   . Hypertension   . Anxiety   . Oxygen dependent     requires oxygen for at least 16 hours per day,   . Hypothyroidism   . COPD (chronic obstructive pulmonary disease)     on home oxygen  . Anemia     iron deficiency anemia  . Gastric erosions     causing anemia  . Chronic respiratory failure   . Gastric ulcer   . Borderline diabetes   . Cancer   . Lung cancer   . 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 H/O cerebral aneurysm repair 11/08/2012   Past Surgical History  Procedure Laterality Date  . Lung removal, partial    . Abdominal hysterectomy    . Esophagogastroduodenoscopy  05/11/2012    Procedure: ESOPHAGOGASTRODUODENOSCOPY (EGD);  Surgeon: Rogene Houston, MD;  Location: [facility];  Service: Endoscopy;  Laterality: N/A;  . Cataract extraction, bilateral     Social History:  reports that she has been smoking.  She has never used smokeless tobacco. She reports that she does not drink alcohol or use illicit drugs. Lives at home with her husband she can bear weight to transfer from the bed to the chair uses a walker for ambulation but does not ambulate much. She needs assistance with bathing dressing and eating Allergies  Allergen Reactions  . Other Other (See Comments)    Cannot take aspirin due to blood disorder  . Sulfa Antibiotics Other (See Comments)    HANDS,FEET AND MOUTH BLISTERING    Family History  Problem Relation Age of Onset  . Hypertension Mother   . Hypertension Father   .  Diabetes Mother   . Cancer Brother   . Diabetes Brother   . Diabetes Brother      Prior to Admission medications   Medication Sig Start Date End Date Taking? Authorizing Provider  acetaminophen (TYLENOL) 500 MG tablet Take 500-1,000 mg by mouth every 4 (four) hours as needed for headache. For pain   Yes Historical Provider, MD  albuterol (PROVENTIL HFA;VENTOLIN HFA) 108 (90 BASE)  MCG/ACT inhaler Inhale 2 puffs into the lungs 2 (two) times daily as needed. 01/17/10  Yes Historical Provider, MD  albuterol (PROVENTIL) (2.5 MG/3ML) 0.083% nebulizer solution Take 2.5 mg by nebulization 2 (two) times daily.    Yes Historical Provider, MD  CALCIUM-MAGNESIUM-ZINC PO Take 1 tablet by mouth every morning.    Yes Historical Provider, MD  ferrous sulfate 325 (65 FE) MG tablet Take 325 mg by mouth 3 (three) times daily with meals.   Yes Historical Provider, MD  ranitidine (ZANTAC) 150 MG tablet Take 150 mg by mouth daily as needed for heartburn.   Yes Historical Provider, MD  acidophilus (RISAQUAD) CAPS capsule Take 1 capsule by mouth daily. 02/06/15   Robbie Lis, MD  amitriptyline (ELAVIL) 25 MG tablet Take 50 mg by mouth at bedtime.    Historical Provider, MD  atorvastatin (LIPITOR) 20 MG tablet Take 20 mg by mouth at bedtime.     Historical Provider, MD  busPIRone (BUSPAR) 10 MG tablet Take 10 mg by mouth 3 (three) times daily as needed. For anxiety    Historical Provider, MD  cholecalciferol (VITAMIN D) 1000 UNITS tablet Take 5,000 Units by mouth every morning.     Historical Provider, MD  fluticasone (FLONASE) 50 MCG/ACT nasal spray Place 2 sprays into the nose every morning.    Historical Provider, MD  furosemide (LASIX) 20 MG tablet Take 20 mg by mouth daily as needed for fluid.    Historical Provider, MD  levothyroxine (SYNTHROID, LEVOTHROID) 100 MCG tablet Take 100 mcg by mouth daily.    Historical Provider, MD  lisinopril (PRINIVIL,ZESTRIL) 20 MG tablet Take 10 mg by mouth at bedtime.     Historical Provider, MD  metFORMIN (GLUCOPHAGE) 500 MG tablet Take 500 mg by mouth daily. 01/09/15   Historical Provider, MD  omeprazole (PRILOSEC) 40 MG capsule Take 1 capsule (40 mg total) by mouth 2 (two) times daily. 05/12/12   Kathie Dike, MD  potassium gluconate 595 MG TABS tablet Take 595 mg by mouth daily.    Historical Provider, MD  Probiotic Product ([place]) CAPS Take  1 capsule by mouth daily.    Historical Provider, MD  theophylline (THEO-24) 300 MG 24 hr capsule Take 300 mg by mouth 2 (two) times daily.    Historical Provider, MD  tiotropium (SPIRIVA) 18 MCG inhalation capsule Place 18 mcg into inhaler and inhale daily.    Historical Provider, MD   Physical Exam: Filed Vitals:   07/06/15 1330 07/06/15 1400 07/06/15 1430 07/06/15 1500  BP: 144/90 123/83 124/90 120/85  Pulse:  25 81 80  Temp:      TempSrc:      Resp: '20 18 19 21  '$ SpO2:   97% 98%    Wt Readings from Last 3 Encounters:  02/04/15 43.772 kg (96 lb 8 oz)  06/20/13 52.164 kg (115 lb)  02/15/13 52.3 kg (115 lb 4.8 oz)    General:  Appears calm and comfortable, cachectic Eyes: PERRL, normal lids, irises & conjunctiva ENT: grossly normal hearing, lips & tongue, mucus membranes  of her mouth are pink but somewhat dry, bilateral temporal wasting Neck: no LAD, masses or thyromegaly Cardiovascular: RRR, no m/r/g. No LE edema. Pedal pulses present and palpable  Respiratory: CTA bilaterally, no w/r/r. Normal respiratory effort. Abdomen: soft, ntnd other bowel sounds throughout Skin: no rash or induration seen on limited exam Musculoskeletal: grossly normal tone BUE/BLE Psychiatric: grossly normal mood and affect, speech fluent and appropriate Neurologic: grossly non-focal. Oriented 3 speech slow but clear moves all extremities able to follow commands           Labs on Admission:  Basic Metabolic Panel:  Recent Labs Lab 07/06/15 1212  NA 132*  K 3.8  CL 93*  CO2 29  GLUCOSE 174*  BUN 15  CREATININE 1.03*  CALCIUM 10.2   Liver Function Tests: No results for input(s): AST, ALT, ALKPHOS, BILITOT, PROT, ALBUMIN in the last 168 hours. No results for input(s): LIPASE, AMYLASE in the last 168 hours. No results for input(s): AMMONIA in the last 168 hours. CBC:  Recent Labs Lab 07/06/15 1212  WBC 5.3  NEUTROABS 3.8  HGB 13.6  HCT 39.6  MCV 87.2  PLT 325   Cardiac  Enzymes:  Recent Labs Lab 07/06/15 1212  TROPONINI <0.03    BNP (last 3 results) No results for input(s): BNP in the last 8760 hours.  ProBNP (last 3 results) No results for input(s): PROBNP in the last 8760 hours.  CBG: No results for input(s): GLUCAP in the last 168 hours.  Radiological Exams on Admission: Dg Chest 2 View  07/06/2015   CLINICAL DATA:  Generalize weakness, worse today. History of lung cancer.  EXAM: CHEST  2 VIEW  COMPARISON:  02/03/2015  FINDINGS: The cardiomediastinal silhouette is unchanged. Heart is normal in size. Thoracic aortic calcification is noted. Postoperative changes are again seen from prior right upper lobectomy with right lung volume loss and chronic deformities of multiple right upper ribs. The lungs are hyperinflated with emphysematous change again noted. No airspace consolidation, edema, or pneumothorax is seen. There is chronic blunting of the costophrenic angles bilaterally which may be due to hyperinflation and pleural thickening although trace effusions are not excluded. No acute osseous abnormality is seen. 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  IMPRESSION: No evidence of acute airspace disease.  Emphysema.   Electronically Signed   By: Logan Bores   On: 07/06/2015 13:47    EKG: Independently reviewed. His rhythm with prolonged QT  Assessment/Plan Principal Problem:   Urinary tract infectious disease: Denies dysuria or frequency. Will continue the Rocephin that was initiated in the emergency department. Will gently hydrate with IV fluids. Will monitor her urine output. Will await urine culture and adjust anabiotic cyst indicated. She does have a lactic acid of 3.5 on admission but she is hemodynamically stable afebrile and nontoxic appearing. Will monitor closely  Active Problems:  Weakness generalized: According to the family this is chronic and worse the last couple of days likely related  to #1. Will ask physical therapy to evaluate for baseline function level. She obviously has not been eating for quite a while. May benefit from short-term rehabilitation placement.    Failure to thrive in adult: History of severe malnutrition. Will ask PT to evaluate as well as obtain a nutritional consult.    COPD (chronic obstructive pulmonary disease): He is oxygen dependent at home. Will continue oxygen supplementation. She appears stable at baseline. Continue her home inhalers and nebulizers  HTN (hypertension): Stable on admission. Home medications include Lasix which I will hold lisinopril which I will hold as well. Monitor closely resume as indicated    Tobacco abuse: Cessation counseling offered    Hypothyroidism: Obtain a TSH    Hyponatremia: Wild likely related to decreased oral intake. Gentle IV fluids as noted above. Also hold her Lasix. Recheck in the morning    Hyperglycemia: Home medications include metformin. Will provide car modified diet obtain a hemoglobin A1c monitor CBGs and use sliding scale insulin for optimal control    Severe protein-calorie malnutrition: History of same. Nutritional consult     Code Status: full DVT Prophylaxis: Family Communication: unable to contact via phone. Did not accompany patient to hospital Disposition Plan: may benefit from placement  Time spent: 60 minutes  [facility]

## 2015-07-06 NOTE — ED Notes (Signed)
Critical lab:  Lactic Acid 3.6.  Dr. Thurnell Garbe informed.

## 2015-07-06 NOTE — ED Provider Notes (Signed)
CSN: [id number]     Arrival date & time 07/06/15  1203 History   First MD Initiated Contact with Patient 07/06/15 1222     Chief Complaint  Patient presents with  . Fatigue      HPI Pt was seen at 1235. Per EMS, pt's family, and pt: c/o gradual onset and persistence of constant weakness "for along time," worse over the past few days. Pt states she was "sitting on the toilet today and then couldn't get back up" due to her generalized weakness. Denies any specific complaint. Denies CP/SOB, no cough, no abd pain, no N/V/D, no focal motor weakness, no tingling/numbness in extremities, no fevers, no falls.    Past Medical History  Diagnosis Date  . Aneurysm     brain  . GERD (gastroesophageal reflux disease)   . Hypertension   . Anxiety   . Oxygen dependent     requires oxygen for at least 16 hours per day,   . Hypothyroidism   . COPD (chronic obstructive pulmonary disease)     on home oxygen  . Anemia     iron deficiency anemia  . Gastric erosions     causing anemia  . Chronic respiratory failure   . Gastric ulcer   . Borderline diabetes   . Cancer   . Lung cancer   . 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 H/O cerebral aneurysm repair 11/08/2012   Past Surgical History  Procedure Laterality Date  . Lung removal, partial    . Abdominal hysterectomy    . Esophagogastroduodenoscopy  05/11/2012    Procedure: ESOPHAGOGASTRODUODENOSCOPY (EGD);  Surgeon: Rogene Houston, MD;  Location: [facility];  Service: Endoscopy;  Laterality: N/A;  . Cataract extraction, bilateral     Family History  Problem Relation Age of Onset  . Hypertension Mother   . Hypertension Father   . Diabetes Mother   . Cancer Brother   . Diabetes Brother   . Diabetes Brother    History  Substance Use Topics  . Smoking status: Current Every Day Smoker -- 0.25 packs/day for 55 years  . Smokeless tobacco: Never Used  . Alcohol Use: No    Review of Systems ROS: Statement: All systems  negative except as marked or noted in the HPI; Constitutional: Negative for fever and chills. +generalized weakness/fatigue. ; ; Eyes: Negative for eye pain, redness and discharge. ; ; ENMT: Negative for ear pain, hoarseness, nasal congestion, sinus pressure and sore throat. ; ; Cardiovascular: Negative for chest pain, palpitations, diaphoresis, dyspnea and peripheral edema. ; ; Respiratory: Negative for cough, wheezing and stridor. ; ; Gastrointestinal: Negative for nausea, vomiting, diarrhea, abdominal pain, blood in stool, hematemesis, jaundice and rectal bleeding. . ; ; Genitourinary: Negative for dysuria, flank pain and hematuria. ; ; Musculoskeletal: Negative for back pain and neck pain. Negative for swelling and trauma.; ; Skin: Negative for pruritus, rash, abrasions, blisters, bruising and skin lesion.; ; Neuro: Negative for headache, lightheadedness and neck stiffness. Negative for altered level of consciousness , altered mental status, extremity weakness, paresthesias, involuntary movement, seizure and syncope.      Allergies  Other and Sulfa antibiotics  Home Medications   Prior to Admission medications   Medication Sig Start Date End Date Taking? Authorizing Provider  acetaminophen (TYLENOL) 500 MG tablet Take 500-1,000 mg by mouth every 4 (four) hours as needed for headache. For pain    Historical Provider, MD  acidophilus (RISAQUAD) CAPS capsule Take 1 capsule by mouth daily. 02/06/15  Robbie Lis, MD  albuterol (PROVENTIL HFA;VENTOLIN HFA) 108 (90 BASE) MCG/ACT inhaler Inhale 2 puffs into the lungs 2 (two) times daily as needed. 01/17/10   Historical Provider, MD  albuterol (PROVENTIL) (2.5 MG/3ML) 0.083% nebulizer solution Take 2.5 mg by nebulization 2 (two) times daily.     Historical Provider, MD  amitriptyline (ELAVIL) 25 MG tablet Take 50 mg by mouth at bedtime.    Historical Provider, MD  atorvastatin (LIPITOR) 20 MG tablet Take 20 mg by mouth at bedtime.     Historical Provider,  MD  busPIRone (BUSPAR) 10 MG tablet Take 10 mg by mouth 3 (three) times daily as needed. For anxiety    Historical Provider, MD  CALCIUM-MAGNESIUM-ZINC PO Take 1 tablet by mouth every morning.     Historical Provider, MD  cholecalciferol (VITAMIN D) 1000 UNITS tablet Take 5,000 Units by mouth every morning.     Historical Provider, MD  ferrous sulfate 325 (65 FE) MG tablet Take 325 mg by mouth 3 (three) times daily with meals.    Historical Provider, MD  fluticasone (FLONASE) 50 MCG/ACT nasal spray Place 2 sprays into the nose every morning.    Historical Provider, MD  furosemide (LASIX) 20 MG tablet Take 20 mg by mouth daily as needed for fluid.    Historical Provider, MD  levothyroxine (SYNTHROID, LEVOTHROID) 100 MCG tablet Take 100 mcg by mouth daily.    Historical Provider, MD  lisinopril (PRINIVIL,ZESTRIL) 20 MG tablet Take 10 mg by mouth at bedtime.     Historical Provider, MD  metFORMIN (GLUCOPHAGE) 500 MG tablet Take 500 mg by mouth daily. 01/09/15   Historical Provider, MD  omeprazole (PRILOSEC) 40 MG capsule Take 1 capsule (40 mg total) by mouth 2 (two) times daily. 05/12/12   Kathie Dike, MD  potassium gluconate 595 MG TABS tablet Take 595 mg by mouth daily.    Historical Provider, MD  Probiotic Product ([place]) CAPS Take 1 capsule by mouth daily.    Historical Provider, MD  ranitidine (ZANTAC) 150 MG tablet Take 150 mg by mouth daily as needed for heartburn.    Historical Provider, MD  theophylline (THEO-24) 300 MG 24 hr capsule Take 300 mg by mouth 2 (two) times daily.    Historical Provider, MD  tiotropium (SPIRIVA) 18 MCG inhalation capsule Place 18 mcg into inhaler and inhale daily.    Historical Provider, MD   BP 107/95 mmHg  Pulse 87  Temp(Src) 97.7 F (36.5 C) (Oral)  Resp 14  SpO2 95%   12:31 Orthostatic Vital Signs VP  Orthostatic Lying  - BP- Lying: 104/80 mmHg ; Pulse- Lying: 80  Orthostatic Sitting - BP- Sitting: 107/95 mmHg ; Pulse- Sitting: 82   Orthostatic Standing at 0 minutes - BP- Standing at 0 minutes: 102/76 mmHg ; Pulse- Standing at 0 minutes: 86     Filed Vitals:   07/06/15 1330 07/06/15 1400 07/06/15 1430 07/06/15 1500  BP: 144/90 123/83 124/90 120/85  Pulse:  25 81 80  Temp:      TempSrc:      Resp: '20 18 19 21  '$ SpO2:   97% 98%     Physical Exam  1240: Physical examination:  Nursing notes reviewed; Vital signs and O2 SAT reviewed;  Constitutional: Thin, frail. In no acute distress; Head:  Normocephalic, atraumatic; Eyes: EOMI, PERRL, No scleral icterus; ENMT: Mouth and pharynx normal, Mucous membranes dry; Neck: Supple, Full range of motion, No lymphadenopathy; Cardiovascular: Regular rate and rhythm, No gallop;  Respiratory: Breath sounds clear & equal bilaterally, No wheezes.  Speaking full sentences with ease, Normal respiratory effort/excursion; Chest: Nontender, Movement normal; Abdomen: Soft, Nontender, Nondistended, Normal bowel sounds; Genitourinary: No CVA tenderness; Extremities: Pulses normal, No tenderness, No edema, No calf edema or asymmetry.; Neuro: AA&Ox3, vague historian. Major CN grossly intact. No facial droop. Speech clear. Grips equal. Moves all extremities on stretcher without apparent gross focal motor deficits.; Skin: Color normal, Warm, Dry.   ED Course  Procedures     EKG Interpretation   Date/Time:  Thursday July 06 2015 12:09:47 EDT Ventricular Rate:  79 PR Interval:  176 QRS Duration: 95 QT Interval:  484 QTC Calculation: 555 R Axis:   43 Text Interpretation:  Sinus rhythm Low voltage, precordial leads  Nonspecific T abnormalities, lateral leads Prolonged QT interval Baseline  wander Artifact When compared with ECG of 02/03/2015 QT has lengthened  Confirmed by [facility]  MD, Nunzio Cory [phone]) on 07/06/2015 12:54:59 PM      MDM  MDM Reviewed: previous chart, nursing note and vitals Reviewed previous: labs and ECG Interpretation: labs, ECG and x-ray      Results for orders  placed or performed during the hospital encounter of 09/32/35  Basic metabolic panel  Result Value Ref Range   Sodium 132 (L) 135 - 145 mmol/L   Potassium 3.8 3.5 - 5.1 mmol/L   Chloride 93 (L) 101 - 111 mmol/L   CO2 29 22 - 32 mmol/L   Glucose, Bld 174 (H) 65 - 99 mg/dL   BUN 15 6 - 20 mg/dL   Creatinine, Ser 1.03 (H) 0.44 - 1.00 mg/dL   Calcium 10.2 8.9 - 10.3 mg/dL   GFR calc non Af Amer 54 (L) >60 mL/min   GFR calc Af Amer >60 >60 mL/min   Anion gap 10 5 - 15  Troponin I  Result Value Ref Range   Troponin I <0.03 <0.031 ng/mL  Lactic acid, plasma  Result Value Ref Range   Lactic Acid, Venous 3.6 (HH) 0.5 - 2.0 mmol/L  CBC with Differential  Result Value Ref Range   WBC 5.3 4.0 - 10.5 K/uL   RBC 4.54 3.87 - 5.11 MIL/uL   Hemoglobin 13.6 12.0 - 15.0 g/dL   HCT 39.6 36.0 - 46.0 %   MCV 87.2 78.0 - 100.0 fL   MCH 30.0 26.0 - 34.0 pg   MCHC 34.3 30.0 - 36.0 g/dL   RDW 16.2 (H) 11.5 - 15.5 %   Platelets 325 150 - 400 K/uL   Neutrophils Relative % 73 43 - 77 %   Neutro Abs 3.8 1.7 - 7.7 K/uL   Lymphocytes Relative 22 12 - 46 %   Lymphs Abs 1.2 0.7 - 4.0 K/uL   Monocytes Relative 5 3 - 12 %   Monocytes Absolute 0.2 0.1 - 1.0 K/uL   Eosinophils Relative 0 0 - 5 %   Eosinophils Absolute 0.0 0.0 - 0.7 K/uL   Basophils Relative 0 0 - 1 %   Basophils Absolute 0.0 0.0 - 0.1 K/uL  Urinalysis, Routine w reflex microscopic (not at [facility])  Result Value Ref Range   Color, Urine YELLOW YELLOW   APPearance CLOUDY (A) CLEAR   Specific Gravity, Urine 1.020 1.005 - 1.030   pH 7.0 5.0 - 8.0   Glucose, UA NEGATIVE NEGATIVE mg/dL   Hgb urine dipstick TRACE (A) NEGATIVE   Bilirubin Urine NEGATIVE NEGATIVE   Ketones, ur NEGATIVE NEGATIVE mg/dL   Protein, ur TRACE (A) NEGATIVE mg/dL  Urobilinogen, UA 0.2 0.0 - 1.0 mg/dL   Nitrite NEGATIVE NEGATIVE   Leukocytes, UA SMALL (A) NEGATIVE  Urine microscopic-add on  Result Value Ref Range   Squamous Epithelial / LPF RARE RARE   WBC, UA 3-6  <3 WBC/hpf   RBC / HPF 0-2 <3 RBC/hpf   Bacteria, UA MANY (A) RARE   Urine-Other AMORPHOUS URATES/PHOSPHATES    Dg Chest 2 View 07/06/2015   CLINICAL DATA:  Generalize weakness, worse today. History of lung cancer.  EXAM: CHEST  2 VIEW  COMPARISON:  02/03/2015  FINDINGS: The cardiomediastinal silhouette is unchanged. Heart is normal in size. Thoracic aortic calcification is noted. Postoperative changes are again seen from prior right upper lobectomy with right lung volume loss and chronic deformities of multiple right upper ribs. The lungs are hyperinflated with emphysematous change again noted. No airspace consolidation, edema, or pneumothorax is seen. There is chronic blunting of the costophrenic angles bilaterally which may be due to hyperinflation and pleural thickening although trace effusions are not excluded. No acute osseous abnormality is seen. 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  IMPRESSION: No evidence of acute airspace disease.  Emphysema.   Electronically Signed   By: Logan Bores   On: 07/06/2015 13:47    1450:  Pt able to stand at bedside with assist. BUN/Cr elevated from baseline. Hypotensive on arrival. EPIC chart reviewed: baseline BP's 130-150's. Judicious IVF given with slow improvement in SBP to 120's. +UTI, UC pending; will dose IV rocephin.   T/C to [facility] Marin Comment, case discussed, including:  HPI, pertinent PM/SHx, VS/PE, dx testing, ED course and treatment:  Agreeable to admit, requests he will come to ED for evaluation.  Francine Graven, DO 07/07/15 1711

## 2015-07-07 DIAGNOSIS — D649 Anemia, unspecified: Secondary | ICD-10-CM | POA: Diagnosis not present

## 2015-07-07 DIAGNOSIS — I959 Hypotension, unspecified: Secondary | ICD-10-CM | POA: Diagnosis not present

## 2015-07-07 LAB — VITAMIN B12: Vitamin B-12: 644 pg/mL (ref 180–914)

## 2015-07-07 LAB — BASIC METABOLIC PANEL
Anion gap: 9 (ref 5–15)
BUN: 12 mg/dL (ref 6–20)
CO2: 30 mmol/L (ref 22–32)
Calcium: 8.9 mg/dL (ref 8.9–10.3)
Chloride: 98 mmol/L — ABNORMAL LOW (ref 101–111)
Creatinine, Ser: 0.91 mg/dL (ref 0.44–1.00)
GFR calc Af Amer: >60 mL/min (ref >60)
GLUCOSE: 92 mg/dL (ref 65–99)
Potassium: 4.2 mmol/L (ref 3.5–5.1)
Sodium: 137 mmol/L (ref 135–145)

## 2015-07-07 LAB — T4, FREE: FREE T4: 0.47 ng/dL — AB (ref 0.61–1.12)

## 2015-07-07 LAB — RETICULOCYTES
RBC.: 3.64 MIL/uL — ABNORMAL LOW (ref 3.87–5.11)
RETIC COUNT ABSOLUTE: 51 10*3/uL (ref 19.0–186.0)
Retic Ct Pct: 1.4 % (ref 0.4–3.1)

## 2015-07-07 LAB — HEMOGLOBIN A1C
Hgb A1c MFr Bld: 6.9 % — ABNORMAL HIGH (ref 4.8–5.6)
Mean Plasma Glucose: 151 mg/dL

## 2015-07-07 LAB — IRON AND TIBC
IRON: 29 ug/dL (ref 28–170)
SATURATION RATIOS: 11 % (ref 10.4–31.8)
TIBC: 253 ug/dL (ref 250–450)
UIBC: 224 ug/dL

## 2015-07-07 LAB — CBC
HEMATOCRIT: 32.8 % — AB (ref 36.0–46.0)
Hemoglobin: 10.7 g/dL — ABNORMAL LOW (ref 12.0–15.0)
MCH: 28.9 pg (ref 26.0–34.0)
MCHC: 32.6 g/dL (ref 30.0–36.0)
MCV: 88.6 fL (ref 78.0–100.0)
PLATELETS: 289 10*3/uL (ref 150–400)
RBC: 3.7 MIL/uL — ABNORMAL LOW (ref 3.87–5.11)
RDW: 16.5 % — AB (ref 11.5–15.5)
WBC: 4 10*3/uL (ref 4.0–10.5)

## 2015-07-07 LAB — TROPONIN I

## 2015-07-07 LAB — GLUCOSE, CAPILLARY
Glucose-Capillary: 74 mg/dL (ref 65–99)
Glucose-Capillary: 92 mg/dL (ref 65–99)
Glucose-Capillary: 128 mg/dL — ABNORMAL HIGH (ref 65–99)

## 2015-07-07 LAB — FOLATE: FOLATE: 4.8 ng/mL — AB (ref >5.9)

## 2015-07-07 LAB — FERRITIN: Ferritin: 49 ng/mL (ref 11–307)

## 2015-07-07 MED ORDER — DEXTROSE 5 % IV SOLN
1.0000 g | INTRAVENOUS | Status: DC
  Administered 2015-07-07: 1 g via INTRAVENOUS
  Filled 2015-07-07 (×3): qty 10

## 2015-07-07 MED ORDER — CIPROFLOXACIN HCL 250 MG PO TABS: 250.0000 mg | ORAL_TABLET | Freq: Two times a day (BID) | ORAL | Status: AC

## 2015-07-07 MED ORDER — TIOTROPIUM BROMIDE MONOHYDRATE 18 MCG IN CAPS: 18.0000 ug | ORAL_CAPSULE | Freq: Every day | RESPIRATORY_TRACT | Status: AC

## 2015-07-07 NOTE — Progress Notes (Signed)
ANTIBIOTIC CONSULT NOTE - INITIAL  Pharmacy Consult for Rocephin Indication: UTI  Allergies  Allergen Reactions  . Other Other (See Comments)    Cannot take aspirin due to blood disorder  . Sulfa Antibiotics Other (See Comments)    HANDS,FEET AND MOUTH BLISTERING   Patient Measurements: Height: '5\' 7"'$  (170.2 cm) Weight: 90 lb 6.4 oz (41.005 kg) IBW/kg (Calculated) : 61.6  Vital Signs: Temp: 97.8 F (36.6 C) (08/05 0555) Temp Source: Oral (08/05 0555) BP: 133/78 mmHg (08/05 0555) Pulse Rate: 63 (08/05 0555) Intake/Output from previous day: 08/04 0701 - 08/05 0700 In: 858.8 [I.V.:858.8] Out: -  Intake/Output from this shift: Total I/O In: 200 [P.O.:200] Out: -   Labs:  Recent Labs  07/06/15 1212 07/07/15 0605  WBC 5.3 4.0  HGB 13.6 10.7*  PLT 325 289  CREATININE 1.03* 0.91   Estimated Creatinine Clearance: 37.2 mL/min (by C-G formula based on Cr of 0.91). No results for input(s): VANCOTROUGH, VANCOPEAK, VANCORANDOM, GENTTROUGH, GENTPEAK, GENTRANDOM, TOBRATROUGH, TOBRAPEAK, TOBRARND, AMIKACINPEAK, AMIKACINTROU, AMIKACIN in the last 72 hours.   Microbiology: No results found for this or any previous visit (from the past 720 hour(s)).  Medical History: Past Medical History  Diagnosis Date  . Aneurysm     brain  . GERD (gastroesophageal reflux disease)   . Hypertension   . Anxiety   . Oxygen dependent     requires oxygen for at least 16 hours per day,   . Hypothyroidism   . COPD (chronic obstructive pulmonary disease)     on home oxygen  . Anemia     iron deficiency anemia  . Gastric erosions     causing anemia  . Chronic respiratory failure   . Gastric ulcer   . Borderline diabetes   . Cancer   . Lung cancer   . 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 H/O cerebral aneurysm repair 11/08/2012   Anti-infectives    Start     Dose/Rate Route Frequency Ordered Stop   07/07/15 1500  cefTRIAXone (ROCEPHIN) 1 g in dextrose 5 % 50 mL IVPB     1  g 100 mL/hr over 30 Minutes Intravenous Every 24 hours 07/07/15 0812     07/06/15 1500  cefTRIAXone (ROCEPHIN) 1 g in dextrose 5 % 50 mL IVPB     1 g 100 mL/hr over 30 Minutes Intravenous  Once 07/06/15 1450 07/06/15 1545     Assessment: 69yo female with h/o COPD, lung cancer, HTN, malnutrition and small body habitus.  Presented to ED with worsening generalized weakness, asked to initiate Rocephin for UTI.    Goal of Therapy:  Eradicate infection.  Plan:  Rocephin 1gm IV q24hrs Monitor labs, progress, and micro Switch to PO ABX when improved / appropriate  [place] Crane, Shlonda Dolloff A 07/07/2015,10:53 AM

## 2015-07-07 NOTE — Progress Notes (Signed)
[facility] PROGRESS NOTE  Tracey Martinez UYQ:[id number] DOB: 1945/12/06 DOA: 07/06/2015 PCP: Erma Pinto, FNP  Assessment/Plan: Urinary tract infectious disease: Denies dysuria or frequency. Afebrile and non-toxic appearing. Await urine culture.  Will continue the Rocephin day #2. Taking po fluids will decrease IV fluids. She is incontinent of urine output. Will await urine culture. Lactic acid normalized. Will monitor closely  Active Problems: Weakness generalized: According to the family this is chronic and worse the last couple of days likely related to #1. Family at bedside reports she is able to bear weight and transfer to [facility].  Will ask physical therapy to evaluate for baseline function level. She obviously has not been eating well for quite a while. May benefit from short-term rehabilitation placement.   Failure to thrive in adult: History of severe malnutrition. Will ask PT to evaluate as well as obtain a nutritional consult.   COPD (chronic obstructive pulmonary disease): oxygen used on an as needed basis at home per patient. sats 98% on 2L.  Will continue oxygen supplementation and wean as able.  Continue her home inhalers and nebulizers   HTN (hypertension): Stable on admission. Home medications include Lasix which I will hold lisinopril which I will hold as well. Monitor closely resume as indicated   Tobacco abuse: Cessation counseling offered   Hypothyroidism: TSH 36.9. Concern that she may not be getting her meds. Will obtain free T3 and T4. Resume home meds   Hyponatremia: resolved. likely related to decreased oral intake. Continue to hold Lasix. Recheck in the morning   Hyperglycemia: Home medications include metformin. Will provide carb modified diet. Hemoglobin A1c 6.9. Continue SSI   Severe protein-calorie malnutrition: BMI 13.9. History of same. Nutritional consult   Code Status: full Family Communication: unable to contact Disposition Plan:     Consultants:  Social work  PT  Procedures:  none  Antibiotics:  Rocephin 07/06/15>>  HPI/Subjective: Sitting up eating pancakes. Denies pain/discomfort   Objective: Filed Vitals:   07/07/15 0555  BP: 133/78  Pulse: 63  Temp: 97.8 F (36.6 C)  Resp: 20    Intake/Output Summary (Last 24 hours) at 07/07/15 0943 Last data filed at 07/07/15 0900  Gross per 24 hour  Intake 1058.75 ml  Output      0 ml  Net 1058.75 ml   Filed Weights   07/06/15 1814 07/07/15 0555  Weight: 40.115 kg (88 lb 7 oz) 41.005 kg (90 lb 6.4 oz)    Exam:   General:  Cachetic with temporal wasting  Cardiovascular: RRR no m/g/r no LE edema  Respiratory: normal effort BS clear bilaterally but distant  Abdomen: soft +BS non-tender   Musculoskeletal: no clubbing or cyanosis   Data Reviewed: Basic Metabolic Panel:  Recent Labs Lab 07/06/15 1212 07/07/15 0605  NA 132* 137  K 3.8 4.2  CL 93* 98*  CO2 29 30  GLUCOSE 174* 92  BUN 15 12  CREATININE 1.03* 0.91  CALCIUM 10.2 8.9   Liver Function Tests: No results for input(s): AST, ALT, ALKPHOS, BILITOT, PROT, ALBUMIN in the last 168 hours. No results for input(s): LIPASE, AMYLASE in the last 168 hours. No results for input(s): AMMONIA in the last 168 hours. CBC:  Recent Labs Lab 07/06/15 1212 07/07/15 0605  WBC 5.3 4.0  NEUTROABS 3.8  --   HGB 13.6 10.7*  HCT 39.6 32.8*  MCV 87.2 88.6  PLT 325 289   Cardiac Enzymes:  Recent Labs Lab 07/06/15 1212 07/06/15 1949 07/07/15 0605  TROPONINI <  0.03 <0.03 <0.03   BNP (last 3 results) No results for input(s): BNP in the last 8760 hours.  ProBNP (last 3 results) No results for input(s): PROBNP in the last 8760 hours.  CBG:  Recent Labs Lab 07/06/15 2210 07/07/15 0733  GLUCAP 137* 74    No results found for this or any previous visit (from the past 240 hour(s)).   Studies: Dg Chest 2 View  07/06/2015   CLINICAL DATA:  Generalize weakness, worse today. History  of lung cancer.  EXAM: CHEST  2 VIEW  COMPARISON:  02/03/2015  FINDINGS: The cardiomediastinal silhouette is unchanged. Heart is normal in size. Thoracic aortic calcification is noted. Postoperative changes are again seen from prior right upper lobectomy with right lung volume loss and chronic deformities of multiple right upper ribs. The lungs are hyperinflated with emphysematous change again noted. No airspace consolidation, edema, or pneumothorax is seen. There is chronic blunting of the costophrenic angles bilaterally which may be due to hyperinflation and pleural thickening although trace effusions are not excluded. No acute osseous abnormality is seen. 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  IMPRESSION: No evidence of acute airspace disease.  Emphysema.   Electronically Signed   By: Logan Bores   On: 07/06/2015 13:47    Scheduled Meds: . albuterol  2.5 mg Nebulization BID  . amitriptyline  50 mg Oral QHS  . cefTRIAXone (ROCEPHIN)  IV  1 g Intravenous Q24H  . enoxaparin (LOVENOX) injection  30 mg Subcutaneous Q24H  . famotidine  20 mg Oral Daily  . feeding supplement (ENSURE ENLIVE)  237 mL Oral BID BM  . fluticasone  2 spray Each Nare Daily  . insulin aspart  0-5 Units Subcutaneous QHS  . insulin aspart  0-9 Units Subcutaneous TID WC  . levothyroxine  100 mcg Oral QAC breakfast  . pantoprazole  80 mg Oral Daily  . tiotropium  18 mcg Inhalation Daily   Continuous Infusions: . sodium chloride 50 mL/hr at 07/07/15 4008    Principal Problem:   Urinary tract infectious disease Active Problems:   COPD (chronic obstructive pulmonary disease)   Lung cancer   HTN (hypertension)   Tobacco abuse   Hypothyroidism   Hyponatremia   Hyperglycemia   Severe protein-calorie malnutrition   UTI (lower urinary tract infection)   Weakness generalized   Failure to thrive in adult    Time spent: 30 minutes    [place]  [facility] Pager [phone]. If 7PM-7AM, please contact night-coverage at [email or web address], password [facility] 07/07/2015, 9:43 AM

## 2015-07-07 NOTE — Evaluation (Signed)
Physical Therapy Evaluation Patient Details Name: Tracey Martinez MRN: [id number] DOB: [birth year] Today's Date: 07/07/2015   History of Present Illness  69 yo female, came from home, living with husband and son, with of HTN, hypothyroidism, chronic resp failure, malnurished, hx of cancer, admitted for weakness and UTI. Her BP was soft, and she is clinically dehydrated. Will get IVF, give IV antibiotics, check TSH, d/c theophyllin, check B12, and Calcium. She also failed to thrive, and family expressed desire for placement, if not STR. She is a full code.   Clinical Impression   Pt was seen for evaluation.  She was alert and oriented, very cooperative.  She appears to be emaciated with poor muscle bulk.  She has only 3+/5 strength but functionally she is independent in transfers and was able to ambulate with a walker for 150' with a stable gait pattern.  She states that she is able to get to the kitchen for food when hungry.  She declined HHPT as "it didn't really help".  Pt does admit to being very sedentary at home and when questioned she admitted that she did not feel secure walking.  It is not clear to me as to why she feels this way.    Follow Up Recommendations No PT follow up    Equipment Recommendations  None recommended by PT    Recommendations for Other Services   none    Precautions / Restrictions Precautions Precautions: None Restrictions Weight Bearing Restrictions: No      Mobility  Bed Mobility Overal bed mobility: Modified Independent                Transfers Overall transfer level: Modified independent Equipment used: Rolling walker (2 wheeled)                Ambulation/Gait Ambulation/Gait assistance: Modified independent (Device/Increase time) Ambulation Distance (Feet): 150 Feet Assistive device: Rolling walker (2 wheeled) Gait Pattern/deviations: WFL(Within Functional Limits) Gait velocity: gait is slow but stable Gait velocity  interpretation: <1.8 ft/sec, indicative of risk for recurrent falls    Stairs            Wheelchair Mobility    Modified Rankin (Stroke Patients Only)       Balance Overall balance assessment: No apparent balance deficits (not formally assessed)                                           Pertinent Vitals/Pain Pain Assessment: No/denies pain    Home Living Family/patient expects to be discharged to:: Private residence Living Arrangements: Spouse/significant other Available Help at Discharge: Family Type of Home: House Home Access: Stairs to enter Entrance Stairs-Rails: Right Entrance Stairs-Number of Steps: 6 Home Layout: One level Home Equipment: Environmental consultant - 2 wheels;Shower seat      Prior Function Level of Independence: Independent with assistive device(s)         Comments: ambulates with a walker     Hand Dominance        Extremity/Trunk Assessment   Upper Extremity Assessment: Generalized weakness           Lower Extremity Assessment: Generalized weakness      Cervical / Trunk Assessment: Kyphotic  Communication   Communication: No difficulties  Cognition Arousal/Alertness: Awake/alert Behavior During Therapy: WFL for tasks assessed/performed Overall Cognitive Status: Within Functional Limits for tasks assessed  General Comments      Exercises        Assessment/Plan    PT Assessment Patent does not need any further PT services (pt declines HHPT)  PT Diagnosis     PT Problem List    PT Treatment Interventions     PT Goals (Current goals can be found in the Care Plan section) Acute Rehab PT Goals PT Goal Formulation: All assessment and education complete, DC therapy    Frequency     Barriers to discharge        Co-evaluation               End of Session Equipment Utilized During Treatment: Gait belt Activity Tolerance: Patient tolerated treatment well Patient left: in  chair;with call bell/phone within reach;with chair alarm set Nurse Communication: Mobility status    Functional Assessment Tool Used: clinical judgement Functional Limitation: Mobility: Walking and moving around Mobility: Walking and Moving Around Current Status (K0938): At least 1 percent but less than 20 percent impaired, limited or restricted Mobility: Walking and Moving Around Goal Status [phone]): At least 1 percent but less than 20 percent impaired, limited or restricted Mobility: Walking and Moving Around Discharge Status [phone]): At least 1 percent but less than 20 percent impaired, limited or restricted    Time: 1030-1108 PT Time Calculation (min) (ACUTE ONLY): 38 min   Charges:   PT Evaluation $Initial PT Evaluation Tier I: 1 Procedure     PT G Codes:   PT G-Codes **NOT FOR INPATIENT CLASS** Functional Assessment Tool Used: clinical judgement Functional Limitation: Mobility: Walking and moving around Mobility: Walking and Moving Around Current Status (C7893): At least 1 percent but less than 20 percent impaired, limited or restricted Mobility: Walking and Moving Around Goal Status [phone]): At least 1 percent but less than 20 percent impaired, limited or restricted Mobility: Walking and Moving Around Discharge Status [phone]): At least 1 percent but less than 20 percent impaired, limited or restricted    Sable Feil  PT 07/07/2015, 11:14 AM [phone]

## 2015-07-07 NOTE — Care Management (Signed)
Patient has mobility limitations that impair their ability to do one or mormobility-related activities of daily living in customary locations in the home. Patient cannot use crutches, cane or walker to resolve the issue sufficiently, patient can safely self propel the wheelchair in the home or has a caregiver that can assist.

## 2015-07-07 NOTE — Care Management Note (Signed)
Case Management Note  Patient Details  Name: Tracey Martinez MRN: [id number] Date of Birth: [birth year]  Expected Discharge Date:  07/07/15               Expected Discharge Plan:  Home/Self Care  In-House Referral:  Clinical Social Work  Discharge planning Services  CM Consult  Post Acute Care Choice:  Durable Medical Equipment Choice offered to:  Patient  DME Arranged:  Programmer, multimedia DME Agency:  [place]:    [place] Agency:     Status of Service:  Completed, signed off  Medicare Important Message Given:    Date Medicare IM Given:    Medicare IM give by:    Date Additional Medicare IM Given:    Additional Medicare Important Message give by:     If discussed at [place] of Stay Meetings, dates discussed:    Additional Comments: Pt is from home, lives with her husband and son. Pt uses a walker at home for ambulation and also has BSC and shower chair. Pt does not have wheelchair but has her sister "looking for one". Order was given for wheelchair by MD and referral called to Ronalee Belts at [facility]. Ronalee Belts will obtain pt info from chart and have wheelchair delivered to pt's home at discharge. Pt is not currently active with [facility] services but has had them in the past. PT has seen patient and does not recommend [facility] HH PT. Pt does not think [place] services would benefit her. DC anticipated over weekend, no further CM needs.  Sherald Barge, RN 07/07/2015, 11:17 AM

## 2015-07-07 NOTE — Discharge Summary (Signed)
Physician Discharge Summary  FALLON HAECKER HWE:[id number] DOB: [birth year] DOA: 07/06/2015  PCP: Erma Pinto, FNP  Admit date: 07/06/2015 Discharge date: 07/07/2015  Time spent: 40 minutes  Recommendations for Outpatient Follow-up:  1. Follow up with PCP 1-2 weeks for evaluation of functional status. Follow anemia panel 2. Recommend TSH in 4 weeks   Discharge Diagnoses:  Principal Problem:   Urinary tract infectious disease Active Problems:   COPD (chronic obstructive pulmonary disease)   Lung cancer   HTN (hypertension)   Tobacco abuse   Hypothyroidism   Hyponatremia   Hyperglycemia   Severe protein-calorie malnutrition   UTI (lower urinary tract infection)   Weakness generalized   Failure to thrive in adult   Anemia   Discharge Condition: stable  Diet recommendation: carb modified  Filed Weights   07/06/15 1814 07/07/15 0555  Weight: 40.115 kg (88 lb 7 oz) 41.005 kg (90 lb 6.4 oz)    History of present illness:  Tracey Martinez is a 69 y.o. female with a past medical history that includes chronic respiratory failure on home oxygen related to COPD and history of lung cancer, hypertension, hypothyroid, severe protein calorie malnutrition, presented to the emergency department with chief complaint of worsening generalized weakness. Initial evaluation in the emergency department revealed a urinary tract infection.  Patient could not recollect circumstances that brought her to the emergency department but she was oriented to person place and time. According to family member at the bedside and ED staff EMS was called to the home for gradual worsening persistent weakness. Family reported it's been going on "for very long time" patient reported sitting on the toilet and not being able to get up. She denied chest pain palpitation shortness of breath headache dizziness syncope or near-syncope. She denied any abdominal pain nausea vomiting diarrhea constipation. She denied numbness  tingling of her extremities. She denied lower extremity edema or orthopnea.  Workup in the emergency department included a chest x-ray with no evidence of acute airspace disease stable emphysema, mild hyponatremia creatinine 1.03 initial troponin 0.03 lactic acid 3.6. Complete blood count  was unremarkable, glucose  174. Urinealysis consistent with a UTI. EKG with sinus rhythm nonspecific T-wave and prolonged QT. The emergency department she was hemodynamically stable afebrile and not hypoxic. She was provided with 500 mL bolus of normal saline and 1 g of Rocephin in the emergency department.   Hospital Course:  Urinary tract infectious disease: likely related to decreased oral intake.  she remained afebrile and non-toxic appearing. Provided with 2 doses rocephin and gentle IV hydration. Will be discharged with cipro. At discharge taking po fluids. Urine output good. Follow up with PCP 1 week for evaluation of resolution of UTI  Active Problems: Weakness generalized: troponin negative x2. ekg without acute changes. No events on tele. TSH 36.  According to the family this is chronic and worse the last couple of days likely related to #1. Evaluated by PT who opine 3/5 strength but functionally independent. Patient refuses HH PT. Recommend repeat TSH 4-6 weeks. Discussed with son importance of patient getting meals and medication   Failure to thrive in adult: History of severe malnutrition. PT evaluation as above. Follow up with PCP 1-2 weeks   COPD (chronic obstructive pulmonary disease): oxygen used on an as needed basis at home per patient. sats 98% on 2L.stable at baseline   HTN (hypertension): Stable.   Tobacco abuse: Cessation counseling offered   Hypothyroidism: TSH 36.9. Concern that she may not be getting  her meds. Free T3 and T4 to be followed by PCP. Discussed importance of taking medications as directed. Recommend repeat TSH in 4-6 weeks   Hyponatremia: resolved at  discharge   Hyperglycemia: Home medications include metformin. Hemoglobin A1c 6.9.   Severe protein-calorie malnutrition: BMI 13.9. History of same. Patient ate 90% of breakfast and 90% of lunch on day of discharge. Pt refused HH    Procedures:  none (i.e. Studies not automatically included, echos, thoracentesis, etc; not x-rays)  Consultations:  none  Discharge Exam: Filed Vitals:   07/07/15 0555  BP: 133/78  Pulse: 63  Temp: 97.8 F (36.6 C)  Resp: 20    General: cachetic but alert and appears comfortable Cardiovascular: RRR no MGR no LE edema Respiratory: normal effort BS clear bilaterally no wheeze  Discharge Instructions   Discharge Instructions    Diet - low sodium heart healthy    Complete by:  As directed      Discharge instructions    Complete by:  As directed   Take medications as directed Follow up with PCP evaluation of functional status     Increase activity slowly    Complete by:  As directed           Current Discharge Medication List    START taking these medications   Details  ciprofloxacin (CIPRO) 250 MG tablet Take 1 tablet (250 mg total) by mouth 2 (two) times daily. Qty: 8 tablet, Refills: 0      CONTINUE these medications which have CHANGED   Details  tiotropium (SPIRIVA) 18 MCG inhalation capsule Place 1 capsule (18 mcg total) into inhaler and inhale daily. Qty: 30 capsule, Refills: 12      CONTINUE these medications which have NOT CHANGED   Details  acetaminophen (TYLENOL) 500 MG tablet Take 500-1,000 mg by mouth every 4 (four) hours as needed for headache. For pain    acidophilus (RISAQUAD) CAPS capsule Take 1 capsule by mouth daily. Qty: 30 capsule, Refills: 0    albuterol (PROVENTIL HFA;VENTOLIN HFA) 108 (90 BASE) MCG/ACT inhaler Inhale 2 puffs into the lungs 2 (two) times daily as needed.    albuterol (PROVENTIL) (2.5 MG/3ML) 0.083% nebulizer solution Take 2.5 mg by nebulization 2 (two) times daily.     amitriptyline  (ELAVIL) 25 MG tablet Take 50 mg by mouth at bedtime.    atorvastatin (LIPITOR) 20 MG tablet Take 20 mg by mouth at bedtime.     busPIRone (BUSPAR) 10 MG tablet Take 10 mg by mouth daily. For anxiety    CALCIUM-MAGNESIUM-ZINC PO Take 1 tablet by mouth every morning.     cholecalciferol (VITAMIN D) 1000 UNITS tablet Take 5,000 Units by mouth every morning.     docusate sodium (COLACE) 100 MG capsule Take 100 mg by mouth every evening.    ferrous sulfate 325 (65 FE) MG tablet Take 325 mg by mouth 3 (three) times daily with meals.    fluticasone (FLONASE) 50 MCG/ACT nasal spray Place 2 sprays into the nose daily as needed for allergies.     furosemide (LASIX) 20 MG tablet Take 20 mg by mouth daily.     levothyroxine (SYNTHROID, LEVOTHROID) 100 MCG tablet Take 100 mcg by mouth daily.    lisinopril (PRINIVIL,ZESTRIL) 5 MG tablet Take 1 tablet by mouth at bedtime.    metFORMIN (GLUCOPHAGE) 500 MG tablet Take 500 mg by mouth daily.    omeprazole (PRILOSEC) 40 MG capsule Take 1 capsule (40 mg total) by mouth 2 (two) times  daily. Qty: 60 capsule, Refills: 2    potassium gluconate 595 MG TABS tablet Take 595 mg by mouth daily.    ranitidine (ZANTAC) 150 MG tablet Take 150 mg by mouth daily as needed for heartburn.    venlafaxine XR (EFFEXOR-XR) 150 MG 24 hr capsule Take 300 mg by mouth daily with breakfast.      STOP taking these medications     theophylline (THEO-24) 300 MG 24 hr capsule        Allergies  Allergen Reactions  . Other Other (See Comments)    Cannot take aspirin due to blood disorder  . Sulfa Antibiotics Other (See Comments)    HANDS,FEET AND MOUTH BLISTERING   Follow-up Information    Follow up with Erma Pinto, FNP.   Specialty:  Nurse Practitioner   Contact information:   [address] [place] [address] [phone]        The results of significant diagnostics from this hospitalization (including imaging, microbiology,  ancillary and laboratory) are listed below for reference.    Significant Diagnostic Studies: Dg Chest 2 View  07/06/2015   CLINICAL DATA:  Generalize weakness, worse today. History of lung cancer.  EXAM: CHEST  2 VIEW  COMPARISON:  02/03/2015  FINDINGS: The cardiomediastinal silhouette is unchanged. Heart is normal in size. Thoracic aortic calcification is noted. Postoperative changes are again seen from prior right upper lobectomy with right lung volume loss and chronic deformities of multiple right upper ribs. The lungs are hyperinflated with emphysematous change again noted. No airspace consolidation, edema, or pneumothorax is seen. There is chronic blunting of the costophrenic angles bilaterally which may be due to hyperinflation and pleural thickening although trace effusions are not excluded. No acute osseous abnormality is seen. 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  IMPRESSION: No evidence of acute airspace disease.  Emphysema.   Electronically Signed   By: Logan Bores   On: 07/06/2015 13:47    Microbiology: No results found for this or any previous visit (from the past 240 hour(s)).   Labs: Basic Metabolic Panel:  Recent Labs Lab 07/06/15 1212 07/07/15 0605  NA 132* 137  K 3.8 4.2  CL 93* 98*  CO2 29 30  GLUCOSE 174* 92  BUN 15 12  CREATININE 1.03* 0.91  CALCIUM 10.2 8.9   Liver Function Tests: No results for input(s): AST, ALT, ALKPHOS, BILITOT, PROT, ALBUMIN in the last 168 hours. No results for input(s): LIPASE, AMYLASE in the last 168 hours. No results for input(s): AMMONIA in the last 168 hours. CBC:  Recent Labs Lab 07/06/15 1212 07/07/15 0605  WBC 5.3 4.0  NEUTROABS 3.8  --   HGB 13.6 10.7*  HCT 39.6 32.8*  MCV 87.2 88.6  PLT 325 289   Cardiac Enzymes:  Recent Labs Lab 07/06/15 1212 07/06/15 1949 07/07/15 0605  TROPONINI <0.03 <0.03 <0.03   BNP: BNP (last 3 results) No results for input(s):  BNP in the last 8760 hours.  ProBNP (last 3 results) No results for input(s): PROBNP in the last 8760 hours.  CBG:  Recent Labs Lab 07/06/15 2210 07/07/15 0733 07/07/15 1152  GLUCAP 137* 74 92       Signed:  BLACK,KAREN M  [facility] 07/07/2015, 12:58 PM

## 2015-07-07 NOTE — Progress Notes (Signed)
Pt's IV catheter removed and intact. Pt's IV site clean dry and intact. Discharge instructions, medications and follow up appointments reviewed and discussed with patient. All questions were answered and no further questions at this time. Pt in stable condition and in no acute distress at time of discharge. Pt escorted by nurse tech.
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
## 2015-07-08 LAB — URINE CULTURE

## 2015-07-08 LAB — T3, FREE: T3, Free: 0.8 pg/mL — ABNORMAL LOW (ref 2.0–4.4)

## 2015-09-02 DEATH — deceased

## 2015-11-24 IMAGING — DX DG CHEST 2V
2 series · 2 of 2 positions shown · non-contrast
Comparison: 02/03/2015

CLINICAL DATA: Generalize weakness, worse today. History of lung
cancer.

EXAM:
CHEST  2 VIEW

[chest lat]
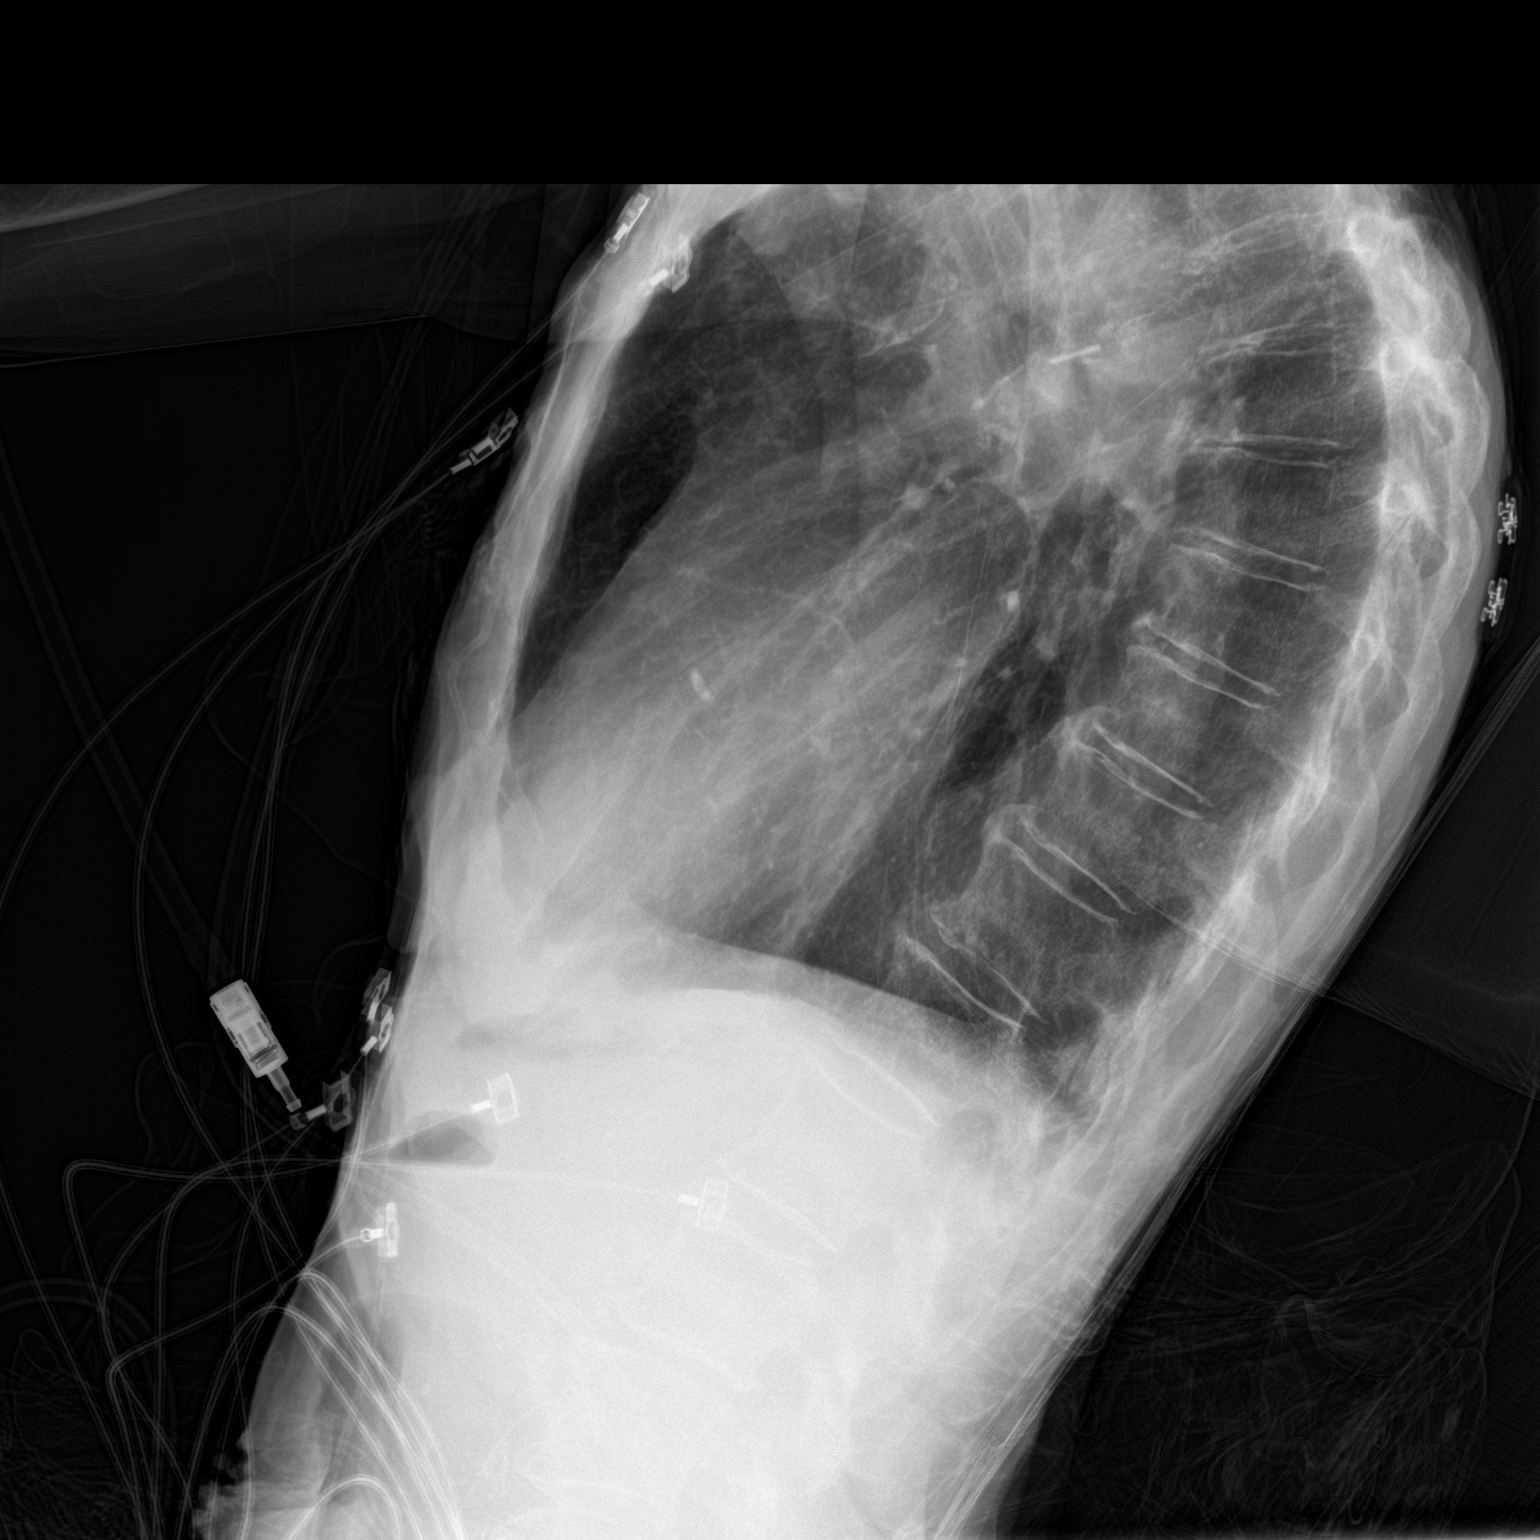

[chest ap]
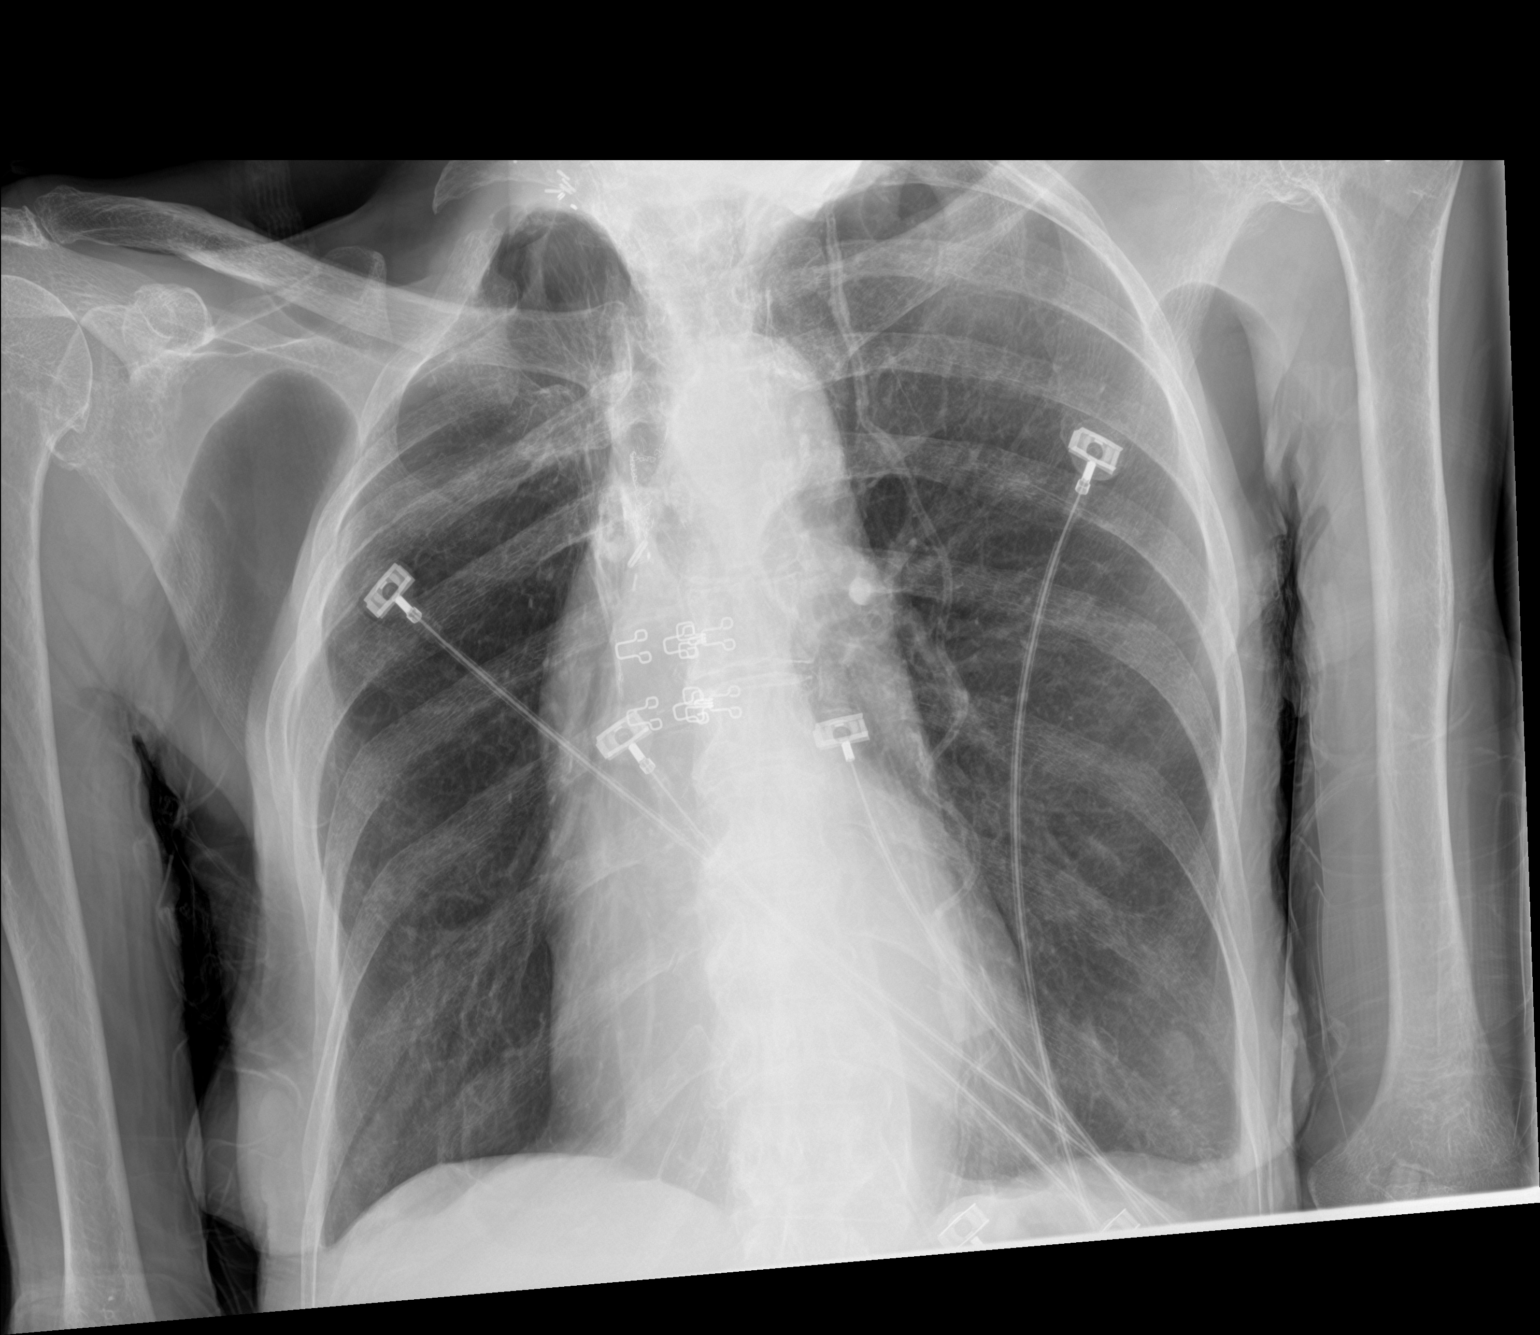

[2 of 2 positions shown; findings below may reference images not displayed]

FINDINGS: The cardiomediastinal silhouette is unchanged. Heart is normal in
size. Thoracic aortic calcification is noted. Postoperative changes
are again seen from prior right upper lobectomy with right lung
volume loss and chronic deformities of multiple right upper ribs.
The lungs are hyperinflated with emphysematous change again noted.
No airspace consolidation, edema, or pneumothorax is seen. There is
chronic blunting of the costophrenic angles bilaterally which may be
due to hyperinflation and pleural thickening although trace
effusions are not excluded. No acute osseous abnormality is seen. VP
shunt catheter is again noted coursing along the left chest with
associated calcification.
IMPRESSION: No evidence of acute airspace disease.  Emphysema.
